# Patient Record
Sex: Male | Born: 1940 | Race: White | Hispanic: No | Marital: Married | Smoking: Former smoker
Health system: Southern US, Community
[De-identification: ages and names within clinical notes are randomized; demographics above are authoritative.]

## PROBLEM LIST (undated history)

## (undated) DIAGNOSIS — I2510 Atherosclerotic heart disease of native coronary artery without angina pectoris: Secondary | ICD-10-CM

## (undated) DIAGNOSIS — I10 Essential (primary) hypertension: Secondary | ICD-10-CM

## (undated) DIAGNOSIS — G609 Hereditary and idiopathic neuropathy, unspecified: Secondary | ICD-10-CM

## (undated) DIAGNOSIS — Z860100 Personal history of colon polyps, unspecified: Secondary | ICD-10-CM

## (undated) DIAGNOSIS — F3289 Other specified depressive episodes: Secondary | ICD-10-CM

## (undated) DIAGNOSIS — F411 Generalized anxiety disorder: Secondary | ICD-10-CM

## (undated) DIAGNOSIS — Z923 Personal history of irradiation: Secondary | ICD-10-CM

## (undated) DIAGNOSIS — D509 Iron deficiency anemia, unspecified: Secondary | ICD-10-CM

## (undated) DIAGNOSIS — R972 Elevated prostate specific antigen [PSA]: Secondary | ICD-10-CM

## (undated) DIAGNOSIS — E785 Hyperlipidemia, unspecified: Secondary | ICD-10-CM

## (undated) DIAGNOSIS — I255 Ischemic cardiomyopathy: Secondary | ICD-10-CM

## (undated) DIAGNOSIS — D696 Thrombocytopenia, unspecified: Secondary | ICD-10-CM

## (undated) DIAGNOSIS — F329 Major depressive disorder, single episode, unspecified: Secondary | ICD-10-CM

## (undated) DIAGNOSIS — Z95810 Presence of automatic (implantable) cardiac defibrillator: Secondary | ICD-10-CM

## (undated) DIAGNOSIS — E11329 Type 2 diabetes mellitus with mild nonproliferative diabetic retinopathy without macular edema: Secondary | ICD-10-CM

## (undated) DIAGNOSIS — K222 Esophageal obstruction: Secondary | ICD-10-CM

## (undated) DIAGNOSIS — N529 Male erectile dysfunction, unspecified: Secondary | ICD-10-CM

## (undated) DIAGNOSIS — C159 Malignant neoplasm of esophagus, unspecified: Secondary | ICD-10-CM

## (undated) DIAGNOSIS — I5022 Chronic systolic (congestive) heart failure: Secondary | ICD-10-CM

## (undated) HISTORY — DX: Other specified depressive episodes: F32.89

## (undated) HISTORY — DX: Hyperlipidemia, unspecified: E78.5

## (undated) HISTORY — DX: Generalized anxiety disorder: F41.1

## (undated) HISTORY — DX: Chronic systolic (congestive) heart failure: I50.22

## (undated) HISTORY — DX: Presence of automatic (implantable) cardiac defibrillator: Z95.810

## (undated) HISTORY — DX: Male erectile dysfunction, unspecified: N52.9

## (undated) HISTORY — DX: Personal history of colonic polyps: Z86.010

## (undated) HISTORY — DX: Type 2 diabetes mellitus with mild nonproliferative diabetic retinopathy without macular edema: E11.329

## (undated) HISTORY — DX: Ischemic cardiomyopathy: I25.5

## (undated) HISTORY — DX: Essential (primary) hypertension: I10

## (undated) HISTORY — DX: Major depressive disorder, single episode, unspecified: F32.9

## (undated) HISTORY — PX: ESOPHAGEAL DILATION: SHX303

## (undated) HISTORY — DX: Hereditary and idiopathic neuropathy, unspecified: G60.9

## (undated) HISTORY — DX: Atherosclerotic heart disease of native coronary artery without angina pectoris: I25.10

## (undated) HISTORY — DX: Thrombocytopenia, unspecified: D69.6

## (undated) HISTORY — DX: Iron deficiency anemia, unspecified: D50.9

## (undated) HISTORY — DX: Personal history of colon polyps, unspecified: Z86.0100

## (undated) HISTORY — PX: CORONARY ARTERY BYPASS GRAFT: SHX141

---

## 1998-02-25 ENCOUNTER — Encounter (HOSPITAL_COMMUNITY): Admission: RE | Admit: 1998-02-25 | Discharge: 1998-05-26 | Payer: Self-pay | Admitting: *Deleted

## 1998-05-28 ENCOUNTER — Encounter (HOSPITAL_COMMUNITY): Admission: RE | Admit: 1998-05-28 | Discharge: 1998-08-26 | Payer: Self-pay | Admitting: *Deleted

## 1999-01-03 ENCOUNTER — Encounter (HOSPITAL_COMMUNITY): Admission: RE | Admit: 1999-01-03 | Discharge: 1999-04-03 | Payer: Self-pay | Admitting: Cardiology

## 1999-04-03 ENCOUNTER — Encounter (HOSPITAL_COMMUNITY): Admission: RE | Admit: 1999-04-03 | Discharge: 1999-07-02 | Payer: Self-pay | Admitting: Cardiology

## 2000-12-13 ENCOUNTER — Encounter: Admission: RE | Admit: 2000-12-13 | Discharge: 2001-03-13 | Payer: Self-pay | Admitting: Endocrinology

## 2003-10-06 ENCOUNTER — Encounter: Payer: Self-pay | Admitting: Gastroenterology

## 2003-10-06 DIAGNOSIS — D126 Benign neoplasm of colon, unspecified: Secondary | ICD-10-CM | POA: Insufficient documentation

## 2003-10-16 ENCOUNTER — Encounter: Payer: Self-pay | Admitting: Gastroenterology

## 2004-10-24 ENCOUNTER — Ambulatory Visit: Payer: Self-pay | Admitting: Endocrinology

## 2004-10-31 ENCOUNTER — Ambulatory Visit: Payer: Self-pay | Admitting: Endocrinology

## 2005-01-26 ENCOUNTER — Ambulatory Visit: Payer: Self-pay | Admitting: Endocrinology

## 2005-01-30 ENCOUNTER — Ambulatory Visit: Payer: Self-pay | Admitting: Endocrinology

## 2005-03-14 ENCOUNTER — Ambulatory Visit: Payer: Self-pay | Admitting: Endocrinology

## 2005-04-14 ENCOUNTER — Ambulatory Visit: Payer: Self-pay | Admitting: Endocrinology

## 2005-06-05 ENCOUNTER — Emergency Department (HOSPITAL_COMMUNITY): Admission: EM | Admit: 2005-06-05 | Discharge: 2005-06-06 | Payer: Self-pay | Admitting: Emergency Medicine

## 2005-06-16 ENCOUNTER — Ambulatory Visit: Payer: Self-pay | Admitting: Endocrinology

## 2005-08-10 ENCOUNTER — Ambulatory Visit: Payer: Self-pay | Admitting: Endocrinology

## 2005-08-15 ENCOUNTER — Ambulatory Visit: Payer: Self-pay | Admitting: Endocrinology

## 2005-08-31 ENCOUNTER — Ambulatory Visit: Payer: Self-pay | Admitting: Cardiology

## 2005-09-13 ENCOUNTER — Ambulatory Visit: Payer: Self-pay

## 2005-09-14 ENCOUNTER — Ambulatory Visit: Payer: Self-pay | Admitting: Endocrinology

## 2005-09-26 ENCOUNTER — Ambulatory Visit: Payer: Self-pay | Admitting: Cardiology

## 2005-10-04 ENCOUNTER — Ambulatory Visit: Payer: Self-pay | Admitting: Internal Medicine

## 2005-10-24 ENCOUNTER — Ambulatory Visit: Payer: Self-pay | Admitting: Endocrinology

## 2005-11-02 ENCOUNTER — Ambulatory Visit: Payer: Self-pay | Admitting: Endocrinology

## 2005-12-04 ENCOUNTER — Ambulatory Visit: Payer: Self-pay | Admitting: Cardiology

## 2005-12-12 ENCOUNTER — Ambulatory Visit: Payer: Self-pay

## 2006-05-01 ENCOUNTER — Ambulatory Visit: Payer: Self-pay | Admitting: Endocrinology

## 2006-06-12 ENCOUNTER — Ambulatory Visit: Payer: Self-pay | Admitting: Cardiology

## 2006-06-21 ENCOUNTER — Ambulatory Visit: Payer: Self-pay

## 2006-10-29 ENCOUNTER — Ambulatory Visit: Payer: Self-pay | Admitting: Endocrinology

## 2006-10-29 LAB — CONVERTED CEMR LAB
Albumin: 4.1 g/dL (ref 3.5–5.2)
Alkaline Phosphatase: 54 units/L (ref 39–117)
BUN: 20 mg/dL (ref 6–23)
Basophils Relative: 0.3 % (ref 0.0–1.0)
CO2: 26 meq/L (ref 19–32)
Calcium: 9 mg/dL (ref 8.4–10.5)
Creatinine, Ser: 1.2 mg/dL (ref 0.4–1.5)
Creatinine,U: 127.1 mg/dL
Eosinophil percent: 2 % (ref 0.0–5.0)
GFR calc non Af Amer: 65 mL/min
Glucose, Bld: 186 mg/dL — ABNORMAL HIGH (ref 70–99)
HDL: 41.2 mg/dL (ref >39.0)
Hemoglobin: 11.3 g/dL — ABNORMAL LOW (ref 13.0–17.0)
Hgb A1c MFr Bld: 6.6 % — ABNORMAL HIGH (ref 4.6–6.0)
Ketones, ur: NEGATIVE mg/dL
Leukocytes, UA: NEGATIVE
Microalb, Ur: 0.5 mg/dL (ref 0.0–1.9)
PSA: 2.55 ng/mL (ref 0.10–4.00)
Potassium: 4.7 meq/L (ref 3.5–5.1)
RBC: 3.62 M/uL — ABNORMAL LOW (ref 4.22–5.81)
RDW: 12.4 % (ref 11.5–14.6)
Specific Gravity, Urine: 1.025 (ref 1.000–1.03)
Total Protein, Urine: NEGATIVE mg/dL
Total Protein: 6.5 g/dL (ref 6.0–8.3)
Triglyceride fasting, serum: 110 mg/dL (ref 0–149)

## 2006-11-22 ENCOUNTER — Ambulatory Visit: Payer: Self-pay | Admitting: Endocrinology

## 2006-12-06 ENCOUNTER — Ambulatory Visit: Payer: Self-pay | Admitting: Cardiology

## 2007-04-04 ENCOUNTER — Ambulatory Visit: Payer: Self-pay | Admitting: Endocrinology

## 2007-04-04 LAB — CONVERTED CEMR LAB
ALT: 16 units/L (ref 0–40)
AST: 16 units/L (ref 0–37)
Basophils Relative: 0.3 % (ref 0.0–1.0)
Bilirubin, Direct: 0.2 mg/dL (ref 0.0–0.3)
CO2: 28 meq/L (ref 19–32)
Calcium: 9 mg/dL (ref 8.4–10.5)
Chloride: 104 meq/L (ref 96–112)
Cholesterol: 134 mg/dL (ref 0–200)
Creatinine, Ser: 1 mg/dL (ref 0.4–1.5)
Eosinophils Relative: 2.3 % (ref 0.0–5.0)
Glucose, Bld: 226 mg/dL — ABNORMAL HIGH (ref 70–99)
HCT: 35.1 % — ABNORMAL LOW (ref 39.0–52.0)
Hemoglobin: 12.2 g/dL — ABNORMAL LOW (ref 13.0–17.0)
Leukocytes, UA: NEGATIVE
Lymphocytes Relative: 21.8 % (ref 12.0–46.0)
MCV: 91.2 fL (ref 78.0–100.0)
Neutrophils Relative %: 67 % (ref 43.0–77.0)
Nitrite: NEGATIVE
Platelets: 168 10*3/uL (ref 150–400)
Sodium: 136 meq/L (ref 135–145)
Specific Gravity, Urine: 1.015 (ref 1.000–1.03)
TSH: 2.72 microintl units/mL (ref 0.35–5.50)
Total Bilirubin: 0.9 mg/dL (ref 0.3–1.2)
Total CHOL/HDL Ratio: 3.6
Total Protein: 6.5 g/dL (ref 6.0–8.3)
Transferrin: 223.1 mg/dL (ref >=212.0)
Triglycerides: 142 mg/dL (ref 0–149)
Urine Glucose: 100 mg/dL — AB
WBC: 5.3 10*3/uL (ref 4.5–10.5)

## 2007-04-15 ENCOUNTER — Ambulatory Visit: Payer: Self-pay | Admitting: Endocrinology

## 2007-05-21 ENCOUNTER — Ambulatory Visit: Payer: Self-pay | Admitting: Cardiology

## 2007-05-21 LAB — CONVERTED CEMR LAB
BUN: 14 mg/dL (ref 6–23)
Basophils Relative: 0 % (ref 0.0–1.0)
CO2: 28 meq/L (ref 19–32)
Chloride: 102 meq/L (ref 96–112)
Creatinine, Ser: 1.1 mg/dL (ref 0.4–1.5)
Hemoglobin: 13.2 g/dL (ref 13.0–17.0)
Lymphocytes Relative: 18 % (ref 12.0–46.0)
Monocytes Absolute: 0.4 10*3/uL (ref 0.2–0.7)
Monocytes Relative: 4.8 % (ref 3.0–11.0)
Neutrophils Relative %: 75.1 % (ref 43.0–77.0)
Potassium: 4.6 meq/L (ref 3.5–5.1)
Prothrombin Time: 12 s (ref 10.0–14.0)
RBC: 4.2 M/uL — ABNORMAL LOW (ref 4.22–5.81)
RDW: 12.7 % (ref 11.5–14.6)

## 2007-05-23 ENCOUNTER — Inpatient Hospital Stay (HOSPITAL_BASED_OUTPATIENT_CLINIC_OR_DEPARTMENT_OTHER): Admission: RE | Admit: 2007-05-23 | Discharge: 2007-05-23 | Payer: Self-pay | Admitting: Cardiovascular Disease

## 2007-05-23 ENCOUNTER — Ambulatory Visit: Payer: Self-pay | Admitting: Cardiology

## 2007-05-23 ENCOUNTER — Ambulatory Visit: Payer: Self-pay | Admitting: Cardiovascular Disease

## 2007-05-23 ENCOUNTER — Ambulatory Visit (HOSPITAL_COMMUNITY): Admission: RE | Admit: 2007-05-23 | Discharge: 2007-05-24 | Payer: Self-pay | Admitting: Cardiovascular Disease

## 2007-05-23 HISTORY — PX: CARDIAC CATHETERIZATION: SHX172

## 2007-06-03 ENCOUNTER — Ambulatory Visit: Payer: Self-pay | Admitting: Endocrinology

## 2007-06-11 ENCOUNTER — Ambulatory Visit: Payer: Self-pay

## 2007-06-11 ENCOUNTER — Encounter: Payer: Self-pay | Admitting: Cardiology

## 2007-06-18 ENCOUNTER — Ambulatory Visit: Payer: Self-pay | Admitting: Cardiology

## 2007-06-24 ENCOUNTER — Ambulatory Visit: Payer: Self-pay | Admitting: Internal Medicine

## 2007-07-02 ENCOUNTER — Ambulatory Visit: Payer: Self-pay | Admitting: Internal Medicine

## 2007-07-02 LAB — CONVERTED CEMR LAB
BUN: 14 mg/dL (ref 6–23)
Basophils Absolute: 0 10*3/uL (ref 0.0–0.1)
Calcium: 9.2 mg/dL (ref 8.4–10.5)
Chloride: 105 meq/L (ref 96–112)
Eosinophils Absolute: 0.1 10*3/uL (ref 0.0–0.6)
Eosinophils Relative: 1.6 % (ref 0.0–5.0)
GFR calc non Af Amer: 80 mL/min
HCT: 35.1 % — ABNORMAL LOW (ref 39.0–52.0)
Lymphocytes Relative: 14.9 % (ref 12.0–46.0)
MCHC: 34.7 g/dL (ref 30.0–36.0)
MCV: 91.2 fL (ref 78.0–100.0)
Neutrophils Relative %: 76.3 % (ref 43.0–77.0)
Platelets: 162 10*3/uL (ref 150–400)
WBC: 7.3 10*3/uL (ref 4.5–10.5)
aPTT: 30.1 s (ref 26.5–36.5)

## 2007-07-05 ENCOUNTER — Ambulatory Visit: Payer: Self-pay | Admitting: Internal Medicine

## 2007-07-05 ENCOUNTER — Ambulatory Visit (HOSPITAL_COMMUNITY): Admission: RE | Admit: 2007-07-05 | Discharge: 2007-07-06 | Payer: Self-pay | Admitting: Internal Medicine

## 2007-07-05 HISTORY — PX: INSERT / REPLACE / REMOVE PACEMAKER: SUR710

## 2007-07-22 ENCOUNTER — Ambulatory Visit: Payer: Self-pay

## 2007-07-31 ENCOUNTER — Ambulatory Visit: Payer: Self-pay | Admitting: Endocrinology

## 2007-07-31 LAB — CONVERTED CEMR LAB
Basophils Relative: 0.3 % (ref 0.0–1.0)
Hgb A1c MFr Bld: 6.3 % — ABNORMAL HIGH (ref 4.6–6.0)
Iron: 54 ug/dL (ref 42–165)
Monocytes Relative: 8.4 % (ref 3.0–11.0)
Platelets: 145 10*3/uL — ABNORMAL LOW (ref 150–400)
RDW: 12.9 % (ref 11.5–14.6)
Saturation Ratios: 18.6 % — ABNORMAL LOW (ref 20.0–50.0)
Transferrin: 207.2 mg/dL — ABNORMAL LOW (ref >=212.0)

## 2007-08-05 ENCOUNTER — Encounter: Payer: Self-pay | Admitting: Endocrinology

## 2007-08-05 DIAGNOSIS — G609 Hereditary and idiopathic neuropathy, unspecified: Secondary | ICD-10-CM | POA: Insufficient documentation

## 2007-08-05 DIAGNOSIS — F329 Major depressive disorder, single episode, unspecified: Secondary | ICD-10-CM | POA: Insufficient documentation

## 2007-08-05 DIAGNOSIS — F411 Generalized anxiety disorder: Secondary | ICD-10-CM | POA: Insufficient documentation

## 2007-08-05 DIAGNOSIS — E109 Type 1 diabetes mellitus without complications: Secondary | ICD-10-CM | POA: Insufficient documentation

## 2007-08-05 DIAGNOSIS — I2510 Atherosclerotic heart disease of native coronary artery without angina pectoris: Secondary | ICD-10-CM | POA: Insufficient documentation

## 2007-08-05 DIAGNOSIS — E785 Hyperlipidemia, unspecified: Secondary | ICD-10-CM | POA: Insufficient documentation

## 2007-08-05 DIAGNOSIS — F3289 Other specified depressive episodes: Secondary | ICD-10-CM | POA: Insufficient documentation

## 2007-08-06 ENCOUNTER — Ambulatory Visit: Payer: Self-pay | Admitting: Endocrinology

## 2007-09-03 ENCOUNTER — Ambulatory Visit: Payer: Self-pay | Admitting: Cardiology

## 2007-09-09 ENCOUNTER — Ambulatory Visit: Payer: Self-pay | Admitting: Endocrinology

## 2007-09-25 ENCOUNTER — Ambulatory Visit: Payer: Self-pay | Admitting: Endocrinology

## 2007-10-16 ENCOUNTER — Telehealth (INDEPENDENT_AMBULATORY_CARE_PROVIDER_SITE_OTHER): Payer: Self-pay | Admitting: *Deleted

## 2007-10-29 ENCOUNTER — Ambulatory Visit: Payer: Self-pay | Admitting: Internal Medicine

## 2007-10-29 ENCOUNTER — Telehealth (INDEPENDENT_AMBULATORY_CARE_PROVIDER_SITE_OTHER): Payer: Self-pay | Admitting: *Deleted

## 2007-11-07 ENCOUNTER — Ambulatory Visit: Payer: Self-pay | Admitting: Endocrinology

## 2007-11-13 LAB — CONVERTED CEMR LAB
Basophils Absolute: 0 10*3/uL (ref 0.0–0.1)
Basophils Relative: 0.2 % (ref 0.0–1.0)
HCT: 33.9 % — ABNORMAL LOW (ref 39.0–52.0)
Hemoglobin: 11.4 g/dL — ABNORMAL LOW (ref 13.0–17.0)
Hgb A1c MFr Bld: 7.3 % — ABNORMAL HIGH (ref 4.6–6.0)
MCHC: 33.7 g/dL (ref 30.0–36.0)
Neutrophils Relative %: 72 % (ref 43.0–77.0)
RBC: 3.66 M/uL — ABNORMAL LOW (ref 4.22–5.81)
RDW: 12.6 % (ref 11.5–14.6)
Transferrin: 231.2 mg/dL (ref >=212.0)
WBC: 5.3 10*3/uL (ref 4.5–10.5)

## 2007-11-14 ENCOUNTER — Ambulatory Visit: Payer: Self-pay | Admitting: Endocrinology

## 2008-01-13 ENCOUNTER — Telehealth (INDEPENDENT_AMBULATORY_CARE_PROVIDER_SITE_OTHER): Payer: Self-pay | Admitting: *Deleted

## 2008-01-21 ENCOUNTER — Telehealth (INDEPENDENT_AMBULATORY_CARE_PROVIDER_SITE_OTHER): Payer: Self-pay | Admitting: *Deleted

## 2008-01-30 ENCOUNTER — Ambulatory Visit: Payer: Self-pay

## 2008-02-14 ENCOUNTER — Ambulatory Visit: Payer: Self-pay | Admitting: Endocrinology

## 2008-02-14 LAB — CONVERTED CEMR LAB: Hgb A1c MFr Bld: 7.4 % — ABNORMAL HIGH (ref 4.6–6.0)

## 2008-02-27 ENCOUNTER — Ambulatory Visit: Payer: Self-pay | Admitting: Cardiology

## 2008-02-28 ENCOUNTER — Ambulatory Visit: Payer: Self-pay | Admitting: Endocrinology

## 2008-03-09 ENCOUNTER — Ambulatory Visit: Payer: Self-pay

## 2008-04-08 ENCOUNTER — Encounter: Payer: Self-pay | Admitting: Endocrinology

## 2008-04-13 ENCOUNTER — Ambulatory Visit: Payer: Self-pay | Admitting: Internal Medicine

## 2008-04-29 ENCOUNTER — Encounter: Payer: Self-pay | Admitting: Endocrinology

## 2008-06-24 ENCOUNTER — Telehealth: Payer: Self-pay | Admitting: Endocrinology

## 2008-07-06 ENCOUNTER — Ambulatory Visit: Payer: Self-pay

## 2008-07-14 ENCOUNTER — Telehealth (INDEPENDENT_AMBULATORY_CARE_PROVIDER_SITE_OTHER): Payer: Self-pay | Admitting: *Deleted

## 2008-08-26 ENCOUNTER — Ambulatory Visit: Payer: Self-pay | Admitting: Endocrinology

## 2008-08-27 ENCOUNTER — Ambulatory Visit: Payer: Self-pay | Admitting: Cardiology

## 2008-09-01 ENCOUNTER — Ambulatory Visit: Payer: Self-pay | Admitting: Endocrinology

## 2008-10-08 ENCOUNTER — Ambulatory Visit: Payer: Self-pay

## 2008-11-30 ENCOUNTER — Ambulatory Visit: Payer: Self-pay | Admitting: Endocrinology

## 2008-12-02 LAB — CONVERTED CEMR LAB
Microalb Creat Ratio: 7.9 mg/g (ref 0.0–30.0)
Microalb, Ur: 0.8 mg/dL (ref 0.0–1.9)

## 2008-12-07 ENCOUNTER — Ambulatory Visit: Payer: Self-pay | Admitting: Endocrinology

## 2008-12-07 DIAGNOSIS — E113299 Type 2 diabetes mellitus with mild nonproliferative diabetic retinopathy without macular edema, unspecified eye: Secondary | ICD-10-CM | POA: Insufficient documentation

## 2008-12-07 DIAGNOSIS — D696 Thrombocytopenia, unspecified: Secondary | ICD-10-CM | POA: Insufficient documentation

## 2008-12-07 DIAGNOSIS — E11329 Type 2 diabetes mellitus with mild nonproliferative diabetic retinopathy without macular edema: Secondary | ICD-10-CM

## 2008-12-07 DIAGNOSIS — D509 Iron deficiency anemia, unspecified: Secondary | ICD-10-CM

## 2008-12-07 DIAGNOSIS — N529 Male erectile dysfunction, unspecified: Secondary | ICD-10-CM

## 2008-12-10 LAB — CONVERTED CEMR LAB
Basophils Absolute: 0 10*3/uL (ref 0.0–0.1)
Basophils Relative: 0.5 % (ref 0.0–3.0)
Bilirubin, Direct: 0.1 mg/dL (ref 0.0–0.3)
Calcium: 9.4 mg/dL (ref 8.4–10.5)
Creatinine, Ser: 1 mg/dL (ref 0.4–1.5)
Crystals: NEGATIVE
Folate: >20 ng/mL
GFR calc Af Amer: 96 mL/min
Iron: 69 ug/dL (ref 42–165)
Leukocytes, UA: NEGATIVE
Lymphocytes Relative: 21.7 % (ref 12.0–46.0)
MCHC: 34.9 g/dL (ref 30.0–36.0)
Mucus, UA: NEGATIVE
Neutrophils Relative %: 68.8 % (ref 43.0–77.0)
Nitrite: NEGATIVE
Platelets: 133 10*3/uL — ABNORMAL LOW (ref 150–400)
RBC / HPF: NONE SEEN
RBC: 3.77 M/uL — ABNORMAL LOW (ref 4.22–5.81)
RDW: 12.4 % (ref 11.5–14.6)
Saturation Ratios: 20.7 % (ref 20.0–50.0)
Sodium: 136 meq/L (ref 135–145)
Specific Gravity, Urine: 1.01 (ref 1.000–1.03)
TSH: 2 microintl units/mL (ref 0.35–5.50)
Total Bilirubin: 1 mg/dL (ref 0.3–1.2)
Total Protein: 6.8 g/dL (ref 6.0–8.3)
Transferrin: 237.6 mg/dL (ref >=212.0)
WBC, UA: NONE SEEN cells/hpf
pH: 5.5 (ref 5.0–8.0)

## 2009-01-05 DIAGNOSIS — I10 Essential (primary) hypertension: Secondary | ICD-10-CM | POA: Insufficient documentation

## 2009-01-07 ENCOUNTER — Ambulatory Visit: Payer: Self-pay | Admitting: Gastroenterology

## 2009-01-15 ENCOUNTER — Ambulatory Visit: Payer: Self-pay | Admitting: Gastroenterology

## 2009-01-18 ENCOUNTER — Encounter: Payer: Self-pay | Admitting: Internal Medicine

## 2009-01-18 ENCOUNTER — Ambulatory Visit: Payer: Self-pay

## 2009-04-09 ENCOUNTER — Telehealth (INDEPENDENT_AMBULATORY_CARE_PROVIDER_SITE_OTHER): Payer: Self-pay | Admitting: *Deleted

## 2009-04-12 ENCOUNTER — Ambulatory Visit: Payer: Self-pay | Admitting: Endocrinology

## 2009-04-12 LAB — CONVERTED CEMR LAB: Hgb A1c MFr Bld: 7 % — ABNORMAL HIGH (ref 4.6–6.5)

## 2009-04-13 ENCOUNTER — Ambulatory Visit: Payer: Self-pay | Admitting: Endocrinology

## 2009-04-15 ENCOUNTER — Ambulatory Visit: Payer: Self-pay | Admitting: Internal Medicine

## 2009-04-15 DIAGNOSIS — Z95810 Presence of automatic (implantable) cardiac defibrillator: Secondary | ICD-10-CM | POA: Insufficient documentation

## 2009-04-20 ENCOUNTER — Ambulatory Visit: Payer: Self-pay | Admitting: Cardiology

## 2009-04-27 ENCOUNTER — Ambulatory Visit: Payer: Self-pay | Admitting: Cardiology

## 2009-04-29 ENCOUNTER — Telehealth (INDEPENDENT_AMBULATORY_CARE_PROVIDER_SITE_OTHER): Payer: Self-pay | Admitting: *Deleted

## 2009-05-04 ENCOUNTER — Encounter: Payer: Self-pay | Admitting: Cardiology

## 2009-05-04 LAB — CONVERTED CEMR LAB
AST: 19 units/L (ref 0–37)
Albumin: 4.1 g/dL (ref 3.5–5.2)
Alkaline Phosphatase: 52 units/L (ref 39–117)
Cholesterol: 118 mg/dL (ref 0–200)
Total Protein: 6.5 g/dL (ref 6.0–8.3)
VLDL: 25.2 mg/dL (ref 0.0–40.0)

## 2009-05-11 ENCOUNTER — Emergency Department (HOSPITAL_COMMUNITY): Admission: EM | Admit: 2009-05-11 | Discharge: 2009-05-11 | Payer: Self-pay | Admitting: Emergency Medicine

## 2009-06-30 ENCOUNTER — Encounter: Payer: Self-pay | Admitting: Internal Medicine

## 2009-06-30 ENCOUNTER — Ambulatory Visit: Payer: Self-pay

## 2009-08-18 ENCOUNTER — Ambulatory Visit: Payer: Self-pay | Admitting: Endocrinology

## 2009-08-18 ENCOUNTER — Encounter (INDEPENDENT_AMBULATORY_CARE_PROVIDER_SITE_OTHER): Payer: Self-pay | Admitting: *Deleted

## 2009-08-18 LAB — CONVERTED CEMR LAB: Hgb A1c MFr Bld: 6.7 % — ABNORMAL HIGH (ref 4.6–6.5)

## 2009-08-24 ENCOUNTER — Ambulatory Visit: Payer: Self-pay | Admitting: Endocrinology

## 2009-08-26 LAB — CONVERTED CEMR LAB
Microalb Creat Ratio: 12.3 mg/g (ref 0.0–30.0)
Microalb, Ur: 0.2 mg/dL (ref 0.0–1.9)

## 2009-09-02 ENCOUNTER — Telehealth: Payer: Self-pay | Admitting: Endocrinology

## 2009-09-09 ENCOUNTER — Encounter (INDEPENDENT_AMBULATORY_CARE_PROVIDER_SITE_OTHER): Payer: Self-pay | Admitting: *Deleted

## 2009-09-09 ENCOUNTER — Telehealth: Payer: Self-pay | Admitting: Cardiology

## 2009-10-07 ENCOUNTER — Encounter: Payer: Self-pay | Admitting: Internal Medicine

## 2009-10-07 ENCOUNTER — Ambulatory Visit: Payer: Self-pay

## 2009-11-11 ENCOUNTER — Telehealth: Payer: Self-pay | Admitting: Endocrinology

## 2009-12-14 ENCOUNTER — Ambulatory Visit: Payer: Self-pay | Admitting: Endocrinology

## 2009-12-14 LAB — CONVERTED CEMR LAB
ALT: 15 units/L (ref 0–53)
AST: 19 units/L (ref 0–37)
Albumin: 4.3 g/dL (ref 3.5–5.2)
Alkaline Phosphatase: 43 units/L (ref 39–117)
Basophils Absolute: 0 10*3/uL (ref 0.0–0.1)
Basophils Relative: 0.2 % (ref 0.0–3.0)
Bilirubin Urine: NEGATIVE
Calcium: 9.1 mg/dL (ref 8.4–10.5)
Eosinophils Relative: 1.6 % (ref 0.0–5.0)
GFR calc non Af Amer: 71.13 mL/min (ref >60)
Glucose, Bld: 218 mg/dL — ABNORMAL HIGH (ref 70–99)
HCT: 36 % — ABNORMAL LOW (ref 39.0–52.0)
HDL: 47 mg/dL (ref >39.00)
Hemoglobin, Urine: NEGATIVE
Hemoglobin: 12.1 g/dL — ABNORMAL LOW (ref 13.0–17.0)
Hgb A1c MFr Bld: 7.5 % — ABNORMAL HIGH (ref 4.6–6.5)
Leukocytes, UA: NEGATIVE
Lymphocytes Relative: 16.1 % (ref 12.0–46.0)
Lymphs Abs: 0.9 10*3/uL (ref 0.7–4.0)
Monocytes Relative: 6.8 % (ref 3.0–12.0)
Neutro Abs: 3.9 10*3/uL (ref 1.4–7.7)
Nitrite: NEGATIVE
Potassium: 4.7 meq/L (ref 3.5–5.1)
RBC: 3.78 M/uL — ABNORMAL LOW (ref 4.22–5.81)
RDW: 12.2 % (ref 11.5–14.6)
Sodium: 139 meq/L (ref 135–145)
TSH: 1.93 microintl units/mL (ref 0.35–5.50)
Total CHOL/HDL Ratio: 4
WBC: 5.3 10*3/uL (ref 4.5–10.5)
pH: 6.5 (ref 5.0–8.0)

## 2009-12-16 ENCOUNTER — Emergency Department (HOSPITAL_COMMUNITY): Admission: EM | Admit: 2009-12-16 | Discharge: 2009-12-16 | Payer: Self-pay | Admitting: Emergency Medicine

## 2009-12-17 ENCOUNTER — Ambulatory Visit: Payer: Self-pay | Admitting: Cardiology

## 2009-12-17 DIAGNOSIS — I080 Rheumatic disorders of both mitral and aortic valves: Secondary | ICD-10-CM

## 2009-12-17 DIAGNOSIS — I25810 Atherosclerosis of coronary artery bypass graft(s) without angina pectoris: Secondary | ICD-10-CM

## 2009-12-17 DIAGNOSIS — I5022 Chronic systolic (congestive) heart failure: Secondary | ICD-10-CM

## 2009-12-20 ENCOUNTER — Ambulatory Visit: Payer: Self-pay | Admitting: Endocrinology

## 2010-01-10 ENCOUNTER — Ambulatory Visit: Payer: Self-pay

## 2010-01-10 ENCOUNTER — Encounter: Payer: Self-pay | Admitting: Cardiology

## 2010-01-10 ENCOUNTER — Ambulatory Visit: Payer: Self-pay | Admitting: Internal Medicine

## 2010-01-10 ENCOUNTER — Encounter: Payer: Self-pay | Admitting: Internal Medicine

## 2010-01-10 ENCOUNTER — Ambulatory Visit (HOSPITAL_COMMUNITY): Admission: RE | Admit: 2010-01-10 | Discharge: 2010-01-10 | Payer: Self-pay | Admitting: Cardiology

## 2010-01-13 ENCOUNTER — Encounter: Payer: Self-pay | Admitting: Endocrinology

## 2010-03-01 ENCOUNTER — Telehealth: Payer: Self-pay | Admitting: Endocrinology

## 2010-03-04 ENCOUNTER — Telehealth: Payer: Self-pay | Admitting: Endocrinology

## 2010-03-15 ENCOUNTER — Ambulatory Visit: Payer: Self-pay | Admitting: Endocrinology

## 2010-03-18 LAB — CONVERTED CEMR LAB: Ferritin: 56.1 ng/mL (ref 22.0–322.0)

## 2010-03-21 ENCOUNTER — Ambulatory Visit: Payer: Self-pay | Admitting: Endocrinology

## 2010-04-12 ENCOUNTER — Ambulatory Visit: Payer: Self-pay | Admitting: Internal Medicine

## 2010-06-22 ENCOUNTER — Ambulatory Visit: Payer: Self-pay | Admitting: Endocrinology

## 2010-06-27 ENCOUNTER — Ambulatory Visit: Payer: Self-pay | Admitting: Endocrinology

## 2010-07-18 ENCOUNTER — Telehealth: Payer: Self-pay | Admitting: Cardiology

## 2010-09-08 ENCOUNTER — Encounter: Payer: Self-pay | Admitting: Internal Medicine

## 2010-09-08 ENCOUNTER — Ambulatory Visit: Payer: Self-pay | Admitting: Cardiology

## 2010-09-20 ENCOUNTER — Ambulatory Visit: Payer: Self-pay | Admitting: Endocrinology

## 2010-09-21 ENCOUNTER — Telehealth: Payer: Self-pay | Admitting: Internal Medicine

## 2010-09-27 ENCOUNTER — Ambulatory Visit: Payer: Self-pay | Admitting: Endocrinology

## 2010-10-24 ENCOUNTER — Ambulatory Visit: Payer: Self-pay | Admitting: Cardiology

## 2010-10-27 LAB — CONVERTED CEMR LAB
Albumin: 4.3 g/dL (ref 3.5–5.2)
Cholesterol: 158 mg/dL (ref 0–200)
HDL: 44.4 mg/dL (ref >39.00)
Triglycerides: 130 mg/dL (ref 0.0–149.0)
VLDL: 26 mg/dL (ref 0.0–40.0)

## 2010-12-12 ENCOUNTER — Ambulatory Visit: Admission: RE | Admit: 2010-12-12 | Discharge: 2010-12-12 | Payer: Self-pay | Source: Home / Self Care

## 2010-12-12 ENCOUNTER — Encounter: Payer: Self-pay | Admitting: Internal Medicine

## 2010-12-26 ENCOUNTER — Ambulatory Visit: Admit: 2010-12-26 | Payer: Self-pay | Admitting: Endocrinology

## 2010-12-29 NOTE — Progress Notes (Signed)
Summary: dental work   Barrister's clerk Note From Other Clinic   Caller: Scientist, water quality of Call: Per [facility] pt to have dental work and needs to know what precautions if any needs to be taking. Please fax [phone] ofc [phone] Initial call taken by: Edman Circle,  July 18, 2010 11:03 AM  Follow-up for Phone Call        He will need to hold his Plavix for 3 days then start back immediately. Follow-up by: Gaylord Shih, MD, [facility],  July 18, 2010 11:39 AM     Appended Document: dental work Faxed ok to dental office. Mylo Red RN

## 2010-12-29 NOTE — Progress Notes (Signed)
Summary: Rx request  Phone Note Call from Patient   Caller: Patient Summary of Call: pt called stating that mail order co has not received Rx for insulin pen needles. I called pt back who them said that [facility] told her that their sys updated every 3-5 days. Pt's spouse also said that pt is almost completely out. I called [facility] to call in pt's Rx but was told that Rx was not at pharmacy designated to pt Rx coverage department. I called pt's spouse and was advised to print Rx for pick up so that they can mail into Mail order directly to avoid further delay.  Initial call taken by: Margaret Pyle, CMA,  March 01, 2010 11:26 AM  Follow-up for Phone Call        i signed Follow-up by: Minus Breeding MD,  March 01, 2010 11:31 AM  Additional Follow-up for Phone Call Additional follow up Details #1::        Rx mailed to pt per pt request Additional Follow-up by: Margaret Pyle, CMA,  March 01, 2010 11:57 AM    New/Updated Medications: HUMALOG KWIKPEN 100 UNIT/ML SOLN (INSULIN LISPRO (HUMAN)) tid (qac) 11-04-27 units, and pen needles three times a day, # 100 needles Prescriptions: HUMALOG KWIKPEN 100 UNIT/ML SOLN (INSULIN LISPRO (HUMAN)) tid (qac) 11-04-27 units, and pen needles three times a day, # 100 needles  #4 boxes x 3   Entered and Authorized by:   Minus Breeding MD   Signed by:   Minus Breeding MD on 03/01/2010   Method used:   Electronically to        [facility] [address] (mail-order)       [facility]       [address]       [place], [place]  [address]       Ph: [phone]       Fax: [phone]   RxID:   [id number] 1/2 " INSULIN PEN NEEDLES tid  #450 x 3   Entered by:   Margaret Pyle, CMA   Authorized by:   Minus Breeding MD   Signed by:   Margaret Pyle, CMA on 03/01/2010   Method used:   Print then Give to Patient   RxID:   [id number]

## 2010-12-29 NOTE — Assessment & Plan Note (Signed)
Summary: rov per spouse call/lg  Medications Added COREG 12.5 MG  TABS (CARVEDILOL) take 1 by mouth two times a day qd LIPITOR 40 MG TABS (ATORVASTATIN CALCIUM) Take one tablet by mouth daily.      Allergies Added: NKDA  Visit Type:  rov Primary Provider:  Romero Belling, MD  CC:  offers no cardiac complaints today.  History of Present Illness: Billy Bush returns today for further evaluation and management of his coronary disease, chronic systolic heart failure, hyperlipidemia.  He is having no orthopnea, PND, palpitations, defibrillator shocks,chest pain, angina, or change in edema.  He is on simvastatin 80 mg. His last LDL was not a goal of 89. With new guidelines, we'll switch him to 40 mg of Lipitor.  His echocardiogram in February showed EF 25% which is stable. He has mild/moderate mitral regurgitation with no evidence of increased right-sided pressures. This is felt to be relatively stable. Blood work in January of this year reviewed. Potassium normal, creatinine 1.1, hemoglobin 12.1, LFTs normal, TSH normal, cholesterol 167, HDL 47, LDL of 89, triglycerides 153, hemoglobin A1c 7.5%. Reviewed with patient.  Current Medications (verified): 1)  Lasix 80 Mg  Tabs (Furosemide) .... Take 1 By Mouth Qd 2)  Altace 5 Mg  Tabs (Ramipril) .... Take 1 By Mouth Qd 3)  Coreg 12.5 Mg  Tabs (Carvedilol) .... Take 1 By Mouth Two Times A Day Qd 4)  Spironolactone 25 Mg  Tabs (Spironolactone) .... Take 1 By Mouth Qd 5)  Plavix 75 Mg  Tabs (Clopidogrel Bisulfate) .... Take 1 By Mouth Once Daily 6)  1/2 " Insulin Pen Needles .... Tid 7)  Co Q-10 30 Mg Caps (Coenzyme Q10) .... Take 1 Tablet By Mouth Once A Day 8)  Multivitamins  Tabs (Multiple Vitamin) .... Take 1 Tablet By Mouth Once A Day 9)  Lipitor 40 Mg Tabs (Atorvastatin Calcium) .... Take One Tablet By Mouth Daily. 10)  Aspirin Ec 325 Mg Tbec (Aspirin) .... Take One Tablet By Mouth Daily 11)  Onetouch Ultra Test  Strp (Glucose Blood) ....  Three Times A Day, and Lancets 250.01 12)  Nitrostat 0.4 Mg Subl (Nitroglycerin) .Marland Kitchen.. 1 Tablet Under Tongue At Onset of Chest Pain; You May Repeat Every 5 Minutes For Up To 3 Doses. 13)  Humalog Kwikpen 100 Unit/ml Soln (Insulin Lispro (Human)) .... Tid (Qac) 10-04-23 Units, and Pen Needles Three Times A Day, # 100 Needles 14)  Glucosamine .... 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 Jude single-chamber defibrillator  07/05/2007  Doylene Canning. 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 quit 14 years ago Alcohol Use - no Illicit Drug Use - no  Risk Factors: Smoking Status: quit (01/07/2009)  Review of Systems       negative other than history of present illness  Vital Signs:  Patient profile:   70 year old male Height:      67 inches Weight:      192.8 pounds BMI:     30.31 Pulse rate:   64 / minute Pulse rhythm:   regular BP sitting:   108 / 58  (left arm) Cuff size:   large  Vitals  Entered By: Billy Bush, Billy Bush (September 08, 2010 10:02 AM)  Physical Exam  General:  Well developed, well nourished, in no acute distress. Head:  normocephalic and atraumatic Eyes:  PERRLA/EOM intact; conjunctiva and lids normal. Neck:  Neck supple, no JVD. No masses, thyromegaly or abnormal cervical nodes. Chest Wall:  no deformities or breast masses noted Lungs:  Clear bilaterally to auscultation and percussion. Heart:  PMI displaced inferior laterally, regular rate and rhythm, soft systolic murmur, no gallop, carotids equal bilaterally without bruits Msk:  Back normal, normal gait. Muscle strength and tone normal. Pulses:  reduced but present the lower extremity Extremities:  trace left pedal edema and trace right pedal edema.  trace left pedal edema.   Neurologic:  Alert and oriented x 3. Skin:  Intact without lesions or rashes. Psych:  Normal affect.    ICD Specifications Following MD:  [name], MD     Referring MD:  WALL ICD Vendor:  St Jude     ICD Model Number:  [phone]     ICD Serial Number:  [id number] ICD DOI:  07/05/2007      Lead 1:    Location: [facility]     DOI: 07/05/2007     Model #: [id number]     Serial #: [id number]     Status: active  Indications::  ICM   ICD Follow Up ICD Dependent:  No      Episodes Coumadin:  No  Brady Parameters Mode VVI     Lower Rate Limit:  40      Tachy Zones VF:  240     VT:  200     VT1:  176     Impression & Recommendations:  Problem # 1:  CAD, ARTERY BYPASS GRAFT (ICD-414.04) Assessment Unchanged  His updated medication list for this problem includes:    Altace 5 Mg Tabs (Ramipril) .Marland Kitchen... Take 1 by mouth qd    Coreg 12.5 Mg Tabs (Carvedilol) .Marland Kitchen... Take 1 by mouth two times a day qd    Plavix 75 Mg Tabs (Clopidogrel bisulfate) .Marland Kitchen... Take 1 by mouth once daily    Aspirin Ec 325 Mg Tbec (Aspirin) .Marland Kitchen... Take one tablet by mouth daily    Nitrostat 0.4 Mg Subl (Nitroglycerin) .Marland Kitchen... 1 tablet under tongue at onset of chest pain; you may repeat  every 5 minutes for up to 3 doses.  Problem # 2:  MITRAL REGURGITATION, 1-2 PLUS (ICD-396.3) Assessment: Unchanged  Problem # 3:  SYSTOLIC HEART FAILURE, CHRONIC (ICD-428.22) Assessment: Unchanged  His updated medication list for this problem includes:    Lasix 80 Mg Tabs (Furosemide) .Marland Kitchen... Take 1 by mouth qd    Altace 5 Mg Tabs (Ramipril) .Marland Kitchen... Take 1 by mouth qd    Coreg 12.5 Mg Tabs (Carvedilol) .Marland Kitchen... Take 1 by mouth two times a day qd    Spironolactone 25 Mg Tabs (Spironolactone) .Marland Kitchen... Take 1 by mouth qd    Plavix 75 Mg Tabs (Clopidogrel bisulfate) .Marland Kitchen... Take 1 by mouth once daily    Aspirin Ec 325 Mg Tbec (Aspirin) .Marland Kitchen... Take one tablet by mouth daily    Nitrostat 0.4 Mg Subl (  Nitroglycerin) .Marland Kitchen... 1 tablet under tongue at onset of chest pain; you may repeat every 5 minutes for up to 3 doses.  Problem # 4:  AUTOMATIC IMPLANTABLE CARDIAC DEFIBRILLATOR SITU (ICD-V45.02) Assessment: Unchanged  Problem # 5:  DYSLIPIDEMIA (ICD-272.4) We'll change to Lipitor 40 mg p.o. q. day. Followup blood work in 6-8 weeks. His updated medication list for this problem includes:    Lipitor 40 Mg Tabs (Atorvastatin calcium) .Marland Kitchen... Take one tablet by mouth daily.  Patient Instructions: 1)  Your physician recommends that you schedule a follow-up appointment in: 6 months with Dr. Daleen Squibb and 3 months with the device clinic. 2)  Your physician recommends that you return for a FASTING lipid profile and liver profile on October 24, 2010 3)  Your physician has recommended you make the following change in your medication: Stop Simvastatin. Start Lipitor 40 mg by mouth daily at bedtime. Prescriptions: LIPITOR 40 MG TABS (ATORVASTATIN CALCIUM) Take one tablet by mouth daily.  #90 x 3   Entered by:   Billy Bush, Billy Bush   Authorized by:   Gaylord Shih, MD, [facility]   Signed by:   Billy Bush, Billy Bush on 09/08/2010   Method used:   Faxed to ...       [facility])       [address] [address]       [place], [place]   [address]       Ph: [phone]       Fax: [phone]   RxID:   [id number] COREG 12.5 MG  TABS (CARVEDILOL) take 1 by mouth two times a day qd  #180 x 3   Entered by:   Billy Bush, Billy Bush   Authorized by:   Gaylord Shih, MD, [facility]   Signed by:   Billy Bush, Billy Bush on 09/08/2010   Method used:   Faxed to ...       [facility])       [address] [address]       [place], [place]  [address]       Ph: [phone]       Fax: [phone]   RxID:   [id number]

## 2010-12-29 NOTE — Cardiovascular Report (Signed)
Summary: Office Visit   Office Visit   Imported By: Roderic Ovens 12/16/2010 16:17:00  _____________________________________________________________________  External Attachment:    Type:   Image     Comment:   External Document

## 2010-12-29 NOTE — Progress Notes (Signed)
Summary: is this for any dental work   Phone Note From Other Clinic   Caller: [facility] [phone] Request: Talk with Nurse Summary of Call: coming off plavix is this for any dental work, pt having root canal pending.  Initial call taken by: Lorne Skeens,  July 18, 2010 3:43 PM  Follow-up for Phone Call        I spoke with Gigi Gin and patient will hold Plavix for 3 days for a root canal. Mylo Red RN

## 2010-12-29 NOTE — Procedures (Signed)
Summary: Cardiology Device Clinic    Allergies: No Known Drug Allergies   ICD Specifications Following MD:  [name], MD     Referring MD:  WALL ICD Vendor:  [employer] Jude     ICD Model Number:  [phone]     ICD Serial Number:  [id number] ICD DOI:  07/05/2007      Lead 1:    Location: RV     DOI: 07/05/2007     Model #: 4782     Serial #: [id number]     Status: active  Indications::  ICM   ICD Follow Up Battery Voltage:  3.16 V     Charge Time:  10.9 seconds     Battery Est. Longevity:  6.2 yrs Underlying rhythm:  SR ICD Dependent:  No       ICD Device Measurements Right Ventricle:  Amplitude: 11.4 mV, Impedance: 410 ohms, Threshold: 0.5 V at 0.5 msec Shock Impedance: 47 ohms   Episodes MS Episodes:  0     Coumadin:  No Shock:  0     ATP:  0     Nonsustained:  0     Atrial Therapies:  0 Ventricular Pacing:  <1%  Brady Parameters Mode VVI     Lower Rate Limit:  40      Tachy Zones VF:  240     VT:  200     VT1:  176     Next Cardiology Appt Due:  11/28/2010 Tech Comments:  NORMAL DEVICE FUNCTION.  NO EPISODES SINCE LAST CHECK.  NO CHANGES MADE. ROV IN 3 MTHS W/DEVICE CLINIC. Vella Kohler  September 08, 2010 10:48 AM

## 2010-12-29 NOTE — Assessment & Plan Note (Signed)
Summary: Z6X  Medications Added * GLUCOSAMINE daily      Allergies Added: NKDA  Visit Type:  Follow-up Primary Provider:  Romero Belling, MD  CC:  no cardaic concerns. Pt did hit left lower leg it bleed alot so he had to go to the ER . They wrapped it for him.  History of Present Illness: Billy Bush comes in today for followup and management of his coronary artery disease, chronic left ventricular systolic dysfunction with a history of systolic heart failure, hypertension, AICD, and moderate mitral regurgitation.  He's lost about 10 pounds. His function capacity is actually improved. He denies orthopnea, PND or peripheral edema. He's had no angina or ischemic symptoms. He denies any palpitations or syncope.  Current Medications (verified): 1)  Lasix 80 Mg  Tabs (Furosemide) .... Take 1 By Mouth Qd 2)  Altace 5 Mg  Tabs (Ramipril) .... Take 1 By Mouth Qd 3)  Coreg 12.5 Mg  Tabs (Carvedilol) .... Take 1 By Mouth Two Times A Day Qd 4)  Spironolactone 25 Mg  Tabs (Spironolactone) .... Take 1 By Mouth Qd 5)  Plavix 75 Mg  Tabs (Clopidogrel Bisulfate) .... Take 1 By Mouth Once Daily 6)  Humalog Pen 100 Unit/ml Soln (Insulin Lispro (Human)) .... Subcutaneously  3  Times A Day  11-04-27 Units 7)  1/2 " Insulin Pen Needles .... Tid 8)  Co Q-10 30 Mg Caps (Coenzyme Q10) .... Take 1 Tablet By Mouth Once A Day 9)  Multivitamins  Tabs (Multiple Vitamin) .... Take 1 Tablet By Mouth Once A Day 10)  Simvastatin 80 Mg Tabs (Simvastatin) .... Take One Tablet By Mouth Daily At Bedtime 11)  Aspirin Ec 325 Mg Tbec (Aspirin) .... Take One Tablet By Mouth Daily 12)  Onetouch Ultra Test  Strp (Glucose Blood) .... Three Times A Day, and Lancets 250.01 13)  Nitrostat 0.4 Mg Subl (Nitroglycerin) .Marland Kitchen.. 1 Tablet Under Tongue At Onset of Chest Pain; You May Repeat Every 5 Minutes For Up To 3 Doses. 14)  Humalog Kwikpen 100 Unit/ml Soln (Insulin Lispro (Human)) .... Tid (Qac) 11-04-27 Units 15)  Glucosamine ....  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 Jude single-chamber defibrillator  07/05/2007  Doylene Canning. 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 Head:  normocephalic and atraumatic Eyes:  PERRLA/EOM intact; conjunctiva and lids normal. Mouth:  Teeth, gums and palate normal. Oral mucosa normal. Neck:  Neck supple, no JVD. No masses, thyromegaly or abnormal cervical nodes. Chest Billy Bush:   no deformities or breast masses noted Lungs:  Clear bilaterally to auscultation and percussion. Heart:  PMI displaced, normal S1-S2, no gallop. There is no significant murmur Abdomen:  Bowel sounds positive; abdomen soft and non-tender without masses, organomegaly, or hernias noted. No hepatosplenomegaly. Msk:  Back normal, normal gait. Muscle strength and tone normal. Pulses:  diminished in lower extremities are present Extremities:  No clubbing or cyanosis. Neurologic:  Alert and oriented x 3. Skin:  Intact without lesions or rashes. Psych:  Normal affect.   Problems:  Medical Problems Added: 1)  Dx of Cad, Artery Bypass Graft  (ICD-414.04) 2)  Dx of Mitral Regurgitation, 1-2 Plus  (ICD-396.3) 3)  Dx of Systolic Heart Failure, Chronic  (ICD-428.22)   ICD Specifications Following MD:  [name], MD     ICD Vendor:  St Jude     ICD Model Number:  [phone]     ICD Serial Number:  [id number] ICD DOI:  07/05/2007      Lead 1:    Location: [facility]     DOI: 07/05/2007     Model #: 8119     Serial #: [id number]     Status: active  Indications::  ICM   ICD Follow Up ICD Dependent:  No      Episodes Coumadin:  No  Brady Parameters Mode VVI     Lower Rate Limit:  40      Tachy Zones VF:  240     VT:  200     VT1:  176     Impression & Recommendations:  Problem # 1:  CORONARY ARTERY DISEASE (ICD-414.00) Assessment Unchanged  His updated medication list for this problem includes:    Altace 5 Mg Tabs (Ramipril) .Marland Kitchen... Take 1 by mouth qd    Coreg 12.5 Mg Tabs (Carvedilol) .Marland Kitchen... Take 1 by mouth two times a day qd    Plavix 75 Mg Tabs (Clopidogrel bisulfate) .Marland Kitchen... Take 1 by mouth once daily    Aspirin Ec 325 Mg Tbec (Aspirin) .Marland Kitchen... Take one tablet by mouth daily    Nitrostat 0.4 Mg Subl (Nitroglycerin) .Marland Kitchen... 1 tablet under tongue at onset of chest pain; you may repeat every 5 minutes for up to 3 doses.  His updated medication list for this problem includes:    Altace 5 Mg Tabs (Ramipril)  .Marland Kitchen... Take 1 by mouth qd    Coreg 12.5 Mg Tabs (Carvedilol) .Marland Kitchen... Take 1 by mouth two times a day qd    Plavix 75 Mg Tabs (Clopidogrel bisulfate) .Marland Kitchen... Take 1 by mouth once daily    Aspirin Ec 325 Mg Tbec (Aspirin) .Marland Kitchen... Take one tablet by mouth daily    Nitrostat 0.4 Mg Subl (Nitroglycerin) .Marland Kitchen... 1 tablet under tongue at onset of chest pain; you may repeat every 5 minutes for up to 3 doses.  Orders: EKG w/ Interpretation (93000)  Problem # 2:  SYSTOLIC HEART FAILURE, CHRONIC (ICD-428.22) Assessment: Unchanged  His updated medication list for this problem includes:    Lasix 80 Mg Tabs (Furosemide) .Marland Kitchen... Take 1 by mouth qd    Altace 5 Mg Tabs (Ramipril) .Marland Kitchen... Take 1  by mouth qd    Coreg 12.5 Mg Tabs (Carvedilol) .Marland Kitchen... Take 1 by mouth two times a day qd    Spironolactone 25 Mg Tabs (Spironolactone) .Marland Kitchen... Take 1 by mouth qd    Plavix 75 Mg Tabs (Clopidogrel bisulfate) .Marland Kitchen... Take 1 by mouth once daily    Aspirin Ec 325 Mg Tbec (Aspirin) .Marland Kitchen... Take one tablet by mouth daily    Nitrostat 0.4 Mg Subl (Nitroglycerin) .Marland Kitchen... 1 tablet under tongue at onset of chest pain; you may repeat every 5 minutes for up to 3 doses.  His updated medication list for this problem includes:    Lasix 80 Mg Tabs (Furosemide) .Marland Kitchen... Take 1 by mouth qd    Altace 5 Mg Tabs (Ramipril) .Marland Kitchen... Take 1 by mouth qd    Coreg 12.5 Mg Tabs (Carvedilol) .Marland Kitchen... Take 1 by mouth two times a day qd    Spironolactone 25 Mg Tabs (Spironolactone) .Marland Kitchen... Take 1 by mouth qd    Plavix 75 Mg Tabs (Clopidogrel bisulfate) .Marland Kitchen... Take 1 by mouth once daily    Aspirin Ec 325 Mg Tbec (Aspirin) .Marland Kitchen... Take one tablet by mouth daily    Nitrostat 0.4 Mg Subl (Nitroglycerin) .Marland Kitchen... 1 tablet under tongue at onset of chest pain; you may repeat every 5 minutes for up to 3 doses.  Problem # 3:  CAD, ARTERY BYPASS GRAFT (ICD-414.04) Assessment: Unchanged  His updated medication list for this problem includes:    Altace 5 Mg Tabs (Ramipril) .Marland Kitchen... Take 1 by  mouth qd    Coreg 12.5 Mg Tabs (Carvedilol) .Marland Kitchen... Take 1 by mouth two times a day qd    Plavix 75 Mg Tabs (Clopidogrel bisulfate) .Marland Kitchen... Take 1 by mouth once daily    Aspirin Ec 325 Mg Tbec (Aspirin) .Marland Kitchen... Take one tablet by mouth daily    Nitrostat 0.4 Mg Subl (Nitroglycerin) .Marland Kitchen... 1 tablet under tongue at onset of chest pain; you may repeat every 5 minutes for up to 3 doses.  His updated medication list for this problem includes:    Altace 5 Mg Tabs (Ramipril) .Marland Kitchen... Take 1 by mouth qd    Coreg 12.5 Mg Tabs (Carvedilol) .Marland Kitchen... Take 1 by mouth two times a day qd    Plavix 75 Mg Tabs (Clopidogrel bisulfate) .Marland Kitchen... Take 1 by mouth once daily    Aspirin Ec 325 Mg Tbec (Aspirin) .Marland Kitchen... Take one tablet by mouth daily    Nitrostat 0.4 Mg Subl (Nitroglycerin) .Marland Kitchen... 1 tablet under tongue at onset of chest pain; you may repeat every 5 minutes for up to 3 doses.  Problem # 4:  MITRAL REGURGITATION, 1-2 PLUS (ICD-396.3) I will obtain a 2-D echocardiogram to reassess his LV function as well as the degree of mitral regurgitation. No changes made in medications today.  Other Orders: Echocardiogram (Echo)  Patient Instructions: 1)  Your physician recommends that you schedule a follow-up appointment in: 6 MONTHS WITH DR Sherleen Pangborn 2)  Your physician recommends that you continue on your current medications as directed. Please refer to the Current Medication list given to you today. 3)  Your physician has requested that you have an echocardiogram.  Echocardiography is a painless test that uses sound waves to create images of your heart. It provides your doctor with information about the size and shape of your heart and how well your heart's chambers and valves are working.  This procedure takes approximately one hour. There are no restrictions for this procedure.  Appended Document: F6M Echo and meds  reviewed. No change/stable.

## 2010-12-29 NOTE — Cardiovascular Report (Signed)
Summary: Office Visit   Office Visit   Imported By: Roderic Ovens 01/24/2010 13:12:30  _____________________________________________________________________  External Attachment:    Type:   Image     Comment:   External Document

## 2010-12-29 NOTE — Assessment & Plan Note (Signed)
Summary: 3 month follow up / FLU SHOT /-lb   Vital Signs:  Patient profile:   70 year old male Height:      67 inches (170.18 cm) Weight:      192.31 pounds (87.41 kg) BMI:     30.23 O2 Sat:      99 % on Room air Temp:     97.8 degrees F (36.56 degrees C) oral Pulse rate:   59 / minute BP sitting:   116 / 68  (left arm) Cuff size:   regular  Vitals Entered By: Brenton Grills CMA Duncan Dull) (September 27, 2010 8:07 AM)  O2 Flow:  Room air CC: 3 month F/U/pt needs 3 month rx for Insulin, Lancets, Pen Needles and Test Strips/aj Is Patient Diabetic? Yes   Primary Provider:  Romero Belling, MD  CC:  3 month F/U/pt needs 3 month rx for Insulin, Lancets, and Pen Needles and Test Strips/aj.  History of Present Illness: pt says he gets hypoglycemia 2/month, and these episodes are mild.  he atttributes this to taking more insulin than prescribed.  pt states he feels well in general.  Current Medications (verified): 1)  Lasix 80 Mg  Tabs (Furosemide) .... Take 1 By Mouth Qd 2)  Altace 5 Mg  Tabs (Ramipril) .... Take 1 By Mouth Qd 3)  Coreg 12.5 Mg  Tabs (Carvedilol) .... Take 1 By Mouth Two Times A Day Qd 4)  Spironolactone 25 Mg  Tabs (Spironolactone) .... Take 1 By Mouth Qd 5)  Plavix 75 Mg  Tabs (Clopidogrel Bisulfate) .... Take 1 By Mouth Once Daily 6)  1/2 " Insulin Pen Needles .... Tid 7)  Co Q-10 30 Mg Caps (Coenzyme Q10) .... Take 1 Tablet By Mouth Once A Day 8)  Multivitamins  Tabs (Multiple Vitamin) .... Take 1 Tablet By Mouth Once A Day 9)  Lipitor 40 Mg Tabs (Atorvastatin Calcium) .... Take One Tablet By Mouth Daily. 10)  Aspirin Ec 325 Mg Tbec (Aspirin) .... Take One Tablet By Mouth Daily 11)  Onetouch Ultra Test  Strp (Glucose Blood) .... Three Times A Day, and Lancets 250.01 12)  Nitrostat 0.4 Mg Subl (Nitroglycerin) .Marland Kitchen.. 1 Tablet Under Tongue At Onset of Chest Pain; You May Repeat Every 5 Minutes For Up To 3 Doses. 13)  Humalog Kwikpen 100 Unit/ml Soln (Insulin Lispro (Human))  .... Tid (Qac) 11-04-27 Units, and Pen Needles Three Times A Day, # 100 Needles 14)  Glucosamine .... Daily 15)  Simvastatin 80 Mg Tabs (Simvastatin) .Marland Kitchen.. 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    Physical Exam  General:  normal appearance.     Impression & Recommendations:  Problem # 1:  DIABETES MELLITUS, TYPE I (ICD-250.01) next step is to see the pattern of cbg's on a set dosage of insulin  HgbA1C: 7.4 (09/20/2010)  Medications Added to Medication List This Visit: 1)  Onetouch Ultra Test Strp (Glucose blood) .... 2 times a day, and lancets 250.01 2)  Humalog Kwikpen 100 Unit/ml Soln (Insulin lispro (human)) .... Three times a day (just before each meal) 11-04-27 units, and pen needles three times a day, # 100 needles  Other Orders: Est. Patient Level III (04540) Pneumococcal Vaccine (98119) Admin 1st Vaccine (14782) Flu Vaccine 62yrs + (95621)  Patient Instructions: 1)  check your blood sugar 3 times a day.  vary the time of day when you check, between before the 3 meals, and at bedtime.  also check if you have symptoms of your blood sugar being too high or too low.  please keep a record of the readings and  bring it to your next appointment here.  please call us sooner if you are having low blood sugar episodes.  please make sure to include testing at bedtime.   2)  take humalog to (just before each meal) 10-04-23 units.  please take only this amount.   3)  Please schedule a physical appointment in 3 months, with a1c and microalbumin, and cpx labs prior 250.01. Prescriptions: HUMALOG KWIKPEN 100 UNIT/ML SOLN (INSULIN LISPRO (HUMAN)) three times a day (just before each meal) 11-04-27 units, and pen needles three times a day, # 100 needles  #4 boxes x 3   Entered and Authorized by:   Minus Breeding MD   Signed by:   Minus Breeding MD on 09/27/2010   Method used:   Print then Give to Patient   RxID:   [id number] ONETOUCH ULTRA TEST  STRP (GLUCOSE BLOOD) 2 times a day, and lancets 250.01  #180 x 3   Entered and Authorized by:   Minus Breeding MD   Signed by:   Minus Breeding MD on 09/27/2010   Method used:   Print then Give to Patient   RxID:   [id number]    Orders Added: 1)  Est. Patient Level III [44034] 2)  Pneumococcal Vaccine [74259] 3)  Admin 1st Vaccine [90471] 4)  Flu Vaccine 23yrs + [56387]   Immunizations Administered:  Pneumonia Vaccine:    Vaccine Type: Pneumovax    Site: right deltoid    Mfr: Merck    Dose: 0.5 ml    Route: IM    Given by: Brenton Grills CMA (AAMA)    Exp. Date: 03/23/2012    Lot #: [id number]    VIS given: 11/01/09 version given September 27, 2010.   Immunizations Administered:  Pneumonia Vaccine:    Vaccine Type: Pneumovax    Site: right deltoid    Mfr: Merck    Dose: 0.5 ml    Route: IM    Given by: Brenton Grills CMA (AAMA)    Exp. Date: 03/23/2012    Lot #: [id number]    VIS given: 11/01/09 version given September 27, 2010.    Flu Vaccine Consent Questions     Do you have a history of severe allergic reactions to this vaccine? no    Any prior history of allergic reactions to egg and/or gelatin? no    Do you have a sensitivity to the  preservative Thimersol? no    Do you have a past history of Guillan-Barre Syndrome? no    Do you currently have an acute febrile illness? no    Have you ever had a severe reaction to latex? no    Vaccine information given and explained to patient? yes    Are you currently pregnant? no    Lot Number:[id number]   Exp Date:05/27/2011   Site Given  Left Deltoid  IM1

## 2010-12-29 NOTE — Progress Notes (Signed)
Summary: Gauge?  Phone Note From Pharmacy   Caller: [facility] [phone] x [phone] ref [id number] Summary of Call: [facility] called to ask MD for gauge of Insulin needles. please advise Initial call taken by: Margaret Pyle, CMA,  March 04, 2010 10:02 AM  Follow-up for Phone Call        31g please Follow-up by: Minus Breeding MD,  March 04, 2010 10:10 AM  Additional Follow-up for Phone Call Additional follow up Details #1::        pharmacist informed Additional Follow-up by: Margaret Pyle, CMA,  March 04, 2010 10:16 AM

## 2010-12-29 NOTE — Cardiovascular Report (Signed)
Summary: Office Visit   Office Visit   Imported By: Roderic Ovens 12/09/2009 16:08:36  _____________________________________________________________________  External Attachment:    Type:   Image     Comment:   External Document

## 2010-12-29 NOTE — Assessment & Plan Note (Signed)
Summary: 3 mth fu--stc   Vital Signs:  Patient profile:   70 year old male Height:      67 inches (170.18 cm) Weight:      196.13 pounds (89.15 kg) O2 Sat:      98 % on Room air Temp:     96.9 degrees F (36.06 degrees C) oral Pulse rate:   60 / minute Pulse rhythm:   regular BP sitting:   104 / 68  (left arm) Cuff size:   regular  Vitals Entered By: Brenton Grills MA (June 27, 2010 8:01 AM)  O2 Flow:  Room air CC: 3 mo F/U/aj Is Patient Diabetic? Yes   Primary Provider:  Romero Belling, MD  CC:  3 mo F/U/aj.  History of Present Illness: pt states he feels well in general.  he takes humalog qac 11-04-27 units.  he says he has been varying the insulin dosage.  last night, for example, he took only 18 units with supper.  then he found it high at hs, and took some humalog then.  no cbg record, but states cbg's are sometimes in the 60's at hs.    Current Medications (verified): 1)  Lasix 80 Mg  Tabs (Furosemide) .... Take 1 By Mouth Qd 2)  Altace 5 Mg  Tabs (Ramipril) .... Take 1 By Mouth Qd 3)  Coreg 12.5 Mg  Tabs (Carvedilol) .... Take 1 By Mouth Two Times A Day Qd 4)  Spironolactone 25 Mg  Tabs (Spironolactone) .... Take 1 By Mouth Qd 5)  Plavix 75 Mg  Tabs (Clopidogrel Bisulfate) .... Take 1 By Mouth Once Daily 6)  1/2 " Insulin Pen Needles .... Tid 7)  Co Q-10 30 Mg Caps (Coenzyme Q10) .... Take 1 Tablet By Mouth Once A Day 8)  Multivitamins  Tabs (Multiple Vitamin) .... Take 1 Tablet By Mouth Once A Day 9)  Simvastatin 80 Mg Tabs (Simvastatin) .... Take One Tablet By Mouth Daily At Bedtime 10)  Aspirin Ec 325 Mg Tbec (Aspirin) .... Take One Tablet By Mouth Daily 11)  Onetouch Ultra Test  Strp (Glucose Blood) .... Three Times A Day, and Lancets 250.01 12)  Nitrostat 0.4 Mg Subl (Nitroglycerin) .Marland Kitchen.. 1 Tablet Under Tongue At Onset of Chest Pain; You May Repeat Every 5 Minutes For Up To 3 Doses. 13)  Humalog Kwikpen 100 Unit/ml Soln (Insulin Lispro (Human)) .... Tid (Qac)  10-03-25 Units, and Pen Needles Three Times A Day, # 100 Needles 14)  Glucosamine .... 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    Physical Exam  General:  normal appearance.   Pulses:  dorsalis pedis intact bilat.  Extremities:  no deformity.  no ulcer on the feet.  feet are of normal color and temp.  no edema  Neurologic:  sensation is intact to touch on the feet   Impression & Recommendations:  Problem # 1:  DIABETES MELLITUS, TYPE I (ICD-250.01) therapy limited by noncompliance.  i'll do the best i can.  Medications Added  to Medication List This Visit: 1)  Humalog Kwikpen 100 Unit/ml Soln (Insulin lispro (human)) .... Tid (qac) 10-04-23 units, and pen needles three times a day, # 100 needles  Other Orders: Est. Patient Level III (25956)  Patient Instructions: 1)  check your blood sugar 3 times a day.  vary the time of day when you check, between before the 3 meals, and at bedtime.  also check if you have symptoms of your blood sugar being too high or too low.  please keep a record of the readings and bring it to your next appointment here.  please call us sooner if you are having low blood sugar episodes.  please make sure to  include testing at bedtime.   2)  take humalog to (just before each meal) 10-04-23 units. 3)  Please schedule a follow-up appointment in 3 months, with a1c prior 250.01.

## 2010-12-29 NOTE — Assessment & Plan Note (Signed)
Summary: [facility]saf      Allergies Added: NKDA  Visit Type:  Follow-up Primary Provider:  Romero Belling, MD   History of Present Illness: [name] comes in today for followup and management of his coronary artery disease, chronic left ventricular systolic dysfunction with a history of systolic heart failure, hypertension, AICD, and moderate mitral regurgitation.  He denies any intercurrent ICD therapies.  No c/p, sob and peripheral edema.  He has been exercising regularly.    Current Medications (verified): 1)  Lasix 80 Mg  Tabs (Furosemide) .... Take 1 By Mouth Qd 2)  Altace 5 Mg  Tabs (Ramipril) .... Take 1 By Mouth Qd 3)  Coreg 12.5 Mg  Tabs (Carvedilol) .... Take 1 By Mouth Two Times A Day Qd 4)  Spironolactone 25 Mg  Tabs (Spironolactone) .... Take 1 By Mouth Qd 5)  Plavix 75 Mg  Tabs (Clopidogrel Bisulfate) .... Take 1 By Mouth Once Daily 6)  1/2 " Insulin Pen Needles .... Tid 7)  Co Q-10 30 Mg Caps (Coenzyme Q10) .... Take 1 Tablet By Mouth Once A Day 8)  Multivitamins  Tabs (Multiple Vitamin) .... Take 1 Tablet By Mouth Once A Day 9)  Simvastatin 80 Mg Tabs (Simvastatin) .... Take One Tablet By Mouth Daily At Bedtime 10)  Aspirin Ec 325 Mg Tbec (Aspirin) .... Take One Tablet By Mouth Daily 11)  Onetouch Ultra Test  Strp (Glucose Blood) .... Three Times A Day, and Lancets 250.01 12)  Nitrostat 0.4 Mg Subl (Nitroglycerin) .Marland Kitchen.. 1 Tablet Under Tongue At Onset of Chest Pain; You May Repeat Every 5 Minutes For Up To 3 Doses. 13)  Humalog Kwikpen 100 Unit/ml Soln (Insulin Lispro (Human)) .... Tid (Qac) 10-03-25 Units, and Pen Needles Three Times A Day, # 100 Needles 14)  Glucosamine .... 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    Vital Signs:  Patient profile:   70 year old male Height:      67 inches Weight:      195 pounds BMI:     30.65 Pulse rate:   56 / minute BP sitting:   100 / 64  (left arm)  Vitals Entered By: Laurance Flatten CMA (Apr 12, 2010 8:46 AM)  Physical Exam  General:  normal appearance.  no distress  Eyes:  PERRLA/EOM intact; conjunctiva and lids normal. Mouth:  Teeth, gums and palate normal. Oral mucosa normal. Neck:  Supple without thyroid enlargement or tenderness. No cervical lymphadenopathy, neck masses or tracheal deviation.  Chest Wall:  well healed ICD incision. Lungs:  Clear bilaterally to auscultation with no wheezes, rales, or rhonchi. Heart:  Regular rate and rhythm without murmurs or gallops noted. Normal S1,S2.   Abdomen:  abdomen is soft, nontender.  no hepatosplenomegaly.   not distended.  no hernia  Msk:  muscle bulk and strength are grossly normal.  no obvious joint swelling.  gait is normal and steady  Pulses:  diminished in lower  extremities are present Extremities:  No clubbing or cyanosis. Neurologic:  cn 2-12 grossly intact.   readily moves all 4's.   sensation is intact to touch on the feet     ICD Specifications Following MD:  [name], MD     Referring MD:  WALL ICD Vendor:  [employer]     ICD Model Number:  [phone]     ICD Serial Number:  [id number] ICD DOI:  07/05/2007      Lead 1:    Location: RV      DOI: 07/05/2007     Model #: 8119     Serial #: [id number]     Status: active  Indications::  ICM   ICD Follow Up Battery Voltage:  3.16 V     Charge Time:  10.8 seconds     Battery Est. Longevity:  6.60yrs Underlying rhythm:  sr ICD Dependent:  No       ICD Device Measurements Right Ventricle:  Amplitude: 10.4 mV, Impedance: 400 ohms, Threshold: 0.75 V at 0.5 msec Shock Impedance: 43 ohms   Episodes MS Episodes:  0     Percent Mode Switch:  0     Coumadin:  No Shock:  0     ATP:  0     Nonsustained:  0     Atrial Therapies:  0 Ventricular Pacing:  <1%  Brady Parameters Mode VVI     Lower Rate Limit:  40      Tachy Zones VF:  240     VT:  200     VT1:  176     Next Cardiology Appt Due:  06/27/2010 Tech Comments:  NOIRMAL DEVICE FUNCTION.  TURNED SENSOR OFF AND SINUS REDETECTION FROM 5 INTERVALS TO 3 INTERVALS.  NEXT CHECK IN 3 MTHS. Vella Kohler  Apr 12, 2010 9:02 AM MD Comments:  Agree with above.  Impression & Recommendations:  Problem # 1:  AUTOMATIC IMPLANTABLE CARDIAC DEFIBRILLATOR SITU (ICD-V45.02) His device is working normally.  Will recheck in several months.  Problem # 2:  SYSTOLIC HEART FAILURE, CHRONIC (ICD-428.22) He remains class 1-2.  He will continue his current meds and maintain a low sodium diet. His updated medication list for this problem includes:    Lasix 80 Mg Tabs (Furosemide) .Marland Kitchen... Take 1 by mouth qd    Altace 5 Mg Tabs (Ramipril) .Marland Kitchen... Take 1 by mouth qd    Coreg 12.5 Mg Tabs (Carvedilol) .Marland Kitchen... Take 1 by mouth two times a day qd    Spironolactone 25 Mg Tabs (Spironolactone) .Marland Kitchen... Take 1 by mouth qd    Plavix 75 Mg Tabs (Clopidogrel bisulfate) .Marland Kitchen... Take 1 by mouth once daily    Aspirin Ec 325 Mg Tbec (Aspirin) .Marland Kitchen... Take one tablet by mouth daily    Nitrostat 0.4 Mg Subl (Nitroglycerin) .Marland Kitchen... 1 tablet under tongue at onset of chest pain; you may repeat every 5 minutes for up to 3 doses.  Problem # 3:  HYPERTENSION (ICD-401.9) He will continue his  current meds. His updated medication list for this problem includes:    Lasix 80 Mg Tabs (Furosemide) .Marland Kitchen... Take 1 by mouth qd    Altace 5 Mg Tabs (Ramipril) .Marland Kitchen... Take 1 by mouth qd    Coreg 12.5 Mg Tabs (Carvedilol) .Marland Kitchen... Take 1 by mouth two times a day qd    Spironolactone 25 Mg Tabs (Spironolactone) .Marland Kitchen... Take 1 by mouth qd    Aspirin Ec 325 Mg Tbec (Aspirin) .Marland Kitchen... Take one  tablet by mouth daily  Patient Instructions: 1)  Your physician recommends that you schedule a follow-up appointment in: 12 months with Dr Ladona Ridgel

## 2010-12-29 NOTE — Cardiovascular Report (Signed)
Summary: Office Visit   Office Visit   Imported By: Roderic Ovens 09/15/2010 13:38:02  _____________________________________________________________________  External Attachment:    Type:   Image     Comment:   External Document

## 2010-12-29 NOTE — Procedures (Signed)
Summary: device/saf      Allergies Added: NKDA  Current Medications (verified): 1)  Lasix 80 Mg  Tabs (Furosemide) .... Take 1 By Mouth Qd 2)  Altace 5 Mg  Tabs (Ramipril) .... Take 1 By Mouth Qd 3)  Coreg 12.5 Mg  Tabs (Carvedilol) .... Take 1 By Mouth Two Times A Day Qd 4)  Spironolactone 25 Mg  Tabs (Spironolactone) .... Take 1 By Mouth Qd 5)  Plavix 75 Mg  Tabs (Clopidogrel Bisulfate) .... Take 1 By Mouth Once Daily 6)  Co Q-10 30 Mg Caps (Coenzyme Q10) .... Take 1 Tablet By Mouth Once A Day 7)  Multivitamins  Tabs (Multiple Vitamin) .... Take 1 Tablet By Mouth Once A Day 8)  Lipitor 40 Mg Tabs (Atorvastatin Calcium) .... Take One Tablet By Mouth Daily. 9)  Aspirin Ec 325 Mg Tbec (Aspirin) .... Take One Tablet By Mouth Daily 10)  Onetouch Ultra Test  Strp (Glucose Blood) .... 2 Times A Day, and Lancets 250.01 11)  Nitrostat 0.4 Mg Subl (Nitroglycerin) .Marland Kitchen.. 1 Tablet Under Tongue At Onset of Chest Pain; You May Repeat Every 5 Minutes For Up To 3 Doses. 12)  Humalog Kwikpen 100 Unit/ml Soln (Insulin Lispro (Human)) .... Three Times A Day (Just Before Each Meal) 11-04-27 Units, and Pen Needles Three Times A Day, # 100 Needles 13)  Glucosamine .... Daily  Allergies (verified): No Known Drug Allergies   ICD Specifications Following MD:  [name], MD     Referring MD:  WALL ICD Vendor:  [employer]     ICD Model Number:  [id number]     ICD Serial Number:  [id number] ICD DOI:  07/05/2007      Lead 1:    Location: RV     DOI: 07/05/2007     Model #: 8119     Serial #: [id number]     Status: active  Indications::  ICM   ICD Follow Up Battery Voltage:  3.13 V     Charge Time:  10.9 seconds     Battery Est. Longevity:  6.0 yrs Underlying rhythm:  SR ICD Dependent:  No       ICD Device Measurements Right Ventricle:  Amplitude: 8.0 mV, Impedance: 440 ohms, Threshold: 0.5 V at 0.5 msec Shock Impedance: 45 ohms   Episodes MS Episodes:  0     Percent Mode Switch:  0     Coumadin:  No Shock:  0      ATP:  0     Nonsustained:  0     Atrial Therapies:  0 Ventricular Pacing:  <1%  Brady Parameters Mode VVI     Lower Rate Limit:  40      Tachy Zones VF:  240     VT:  200     VT1:  176     Next Cardiology Appt Due:  02/27/2011 Tech Comments:  NORMAL DEVICE FUNCTION.  NO EPISODES SINCE LAST CHECK.  NO CHANGES MADE. ROV IN 3 MTHS W/GT. Vella Kohler  December 12, 2010 9:33 AM

## 2010-12-29 NOTE — Assessment & Plan Note (Signed)
Summary: physical-#-stc   Vital Signs:  Patient profile:   70 year old male Height:      67 inches (170.18 cm) Weight:      185 pounds (84.09 kg) O2 Sat:      98 % on Room air Temp:     97.6 degrees F (36.44 degrees C) oral Pulse rate:   65 / minute BP sitting:   110 / 60  (left arm) Cuff size:   large  Vitals Entered By: Josph Macho CMA (December 20, 2009 10:03 AM)  O2 Flow:  Room air CC: Physical/ EKG was done with Dr. Daleen Squibb last week/ pt states he is taking Glucosomine but not sure how much/ CF Is Patient Diabetic? Yes   Primary Provider:  Romero Belling, MD  CC:  Physical/ EKG was done with Dr. Daleen Squibb last week/ pt states he is taking Glucosomine but not sure how much/ CF.  History of Present Illness: here for regular wellness examination.  He's feeling pretty well in general, and does not drink or smoke.   Current Medications (verified): 1)  Lasix 80 Mg  Tabs (Furosemide) .... Take 1 By Mouth Qd 2)  Altace 5 Mg  Tabs (Ramipril) .... Take 1 By Mouth Qd 3)  Coreg 12.5 Mg  Tabs (Carvedilol) .... Take 1 By Mouth Two Times A Day Qd 4)  Spironolactone 25 Mg  Tabs (Spironolactone) .... Take 1 By Mouth Qd 5)  Plavix 75 Mg  Tabs (Clopidogrel Bisulfate) .... Take 1 By Mouth Once Daily 6)  Humalog Pen 100 Unit/ml Soln (Insulin Lispro (Human)) .... Subcutaneously  3  Times A Day  11-04-27 Units 7)  1/2 " Insulin Pen Needles .... Tid 8)  Co Q-10 30 Mg Caps (Coenzyme Q10) .... Take 1 Tablet By Mouth Once A Day 9)  Multivitamins  Tabs (Multiple Vitamin) .... Take 1 Tablet By Mouth Once A Day 10)  Simvastatin 80 Mg Tabs (Simvastatin) .... Take One Tablet By Mouth Daily At Bedtime 11)  Aspirin Ec 325 Mg Tbec (Aspirin) .... Take One Tablet By Mouth Daily 12)  Onetouch Ultra Test  Strp (Glucose Blood) .... Three Times A Day, and Lancets 250.01 13)  Nitrostat 0.4 Mg Subl (Nitroglycerin) .Marland Kitchen.. 1 Tablet Under Tongue At Onset of Chest Pain; You May Repeat Every 5 Minutes For Up To 3 Doses. 14)   Humalog Kwikpen 100 Unit/ml Soln (Insulin Lispro (Human)) .... Tid (Qac) 11-04-27 Units 15)  Glucosamine .... Daily 16)  Glucosomine  Allergies (verified): No Known Drug Allergies  Family History: Reviewed history from 12/07/2008 and no changes required. no cancer  Social History: Reviewed history from 01/07/2009 and no changes required. 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 quit 14 years ago Alcohol Use - no Illicit Drug Use - no  Review of Systems  The patient denies fever, vision loss, decreased hearing, chest pain, syncope, dyspnea on exertion, prolonged cough, headaches, abdominal pain, melena, hematochezia, severe indigestion/heartburn, hematuria, and suspicious skin lesions.         he reports mild depression.  denies decreased urinary stream.  Physical Exam  General:  Well developed, well nourished, in no acute distress.  Head:  head: no deformity eyes: no periorbital swelling, no proptosis external nose and ears are normal mouth: no lesion seen Neck:  Supple without thyroid enlargement or tenderness. No cervical lymphadenopathy, neck masses or tracheal deviation.  Chest Wall:  there is a healed midline surgical scar. Heart:  Regular rate and rhythm without murmurs  or gallops noted. Normal S1,S2.   Abdomen:  abdomen is soft, nontender.  no hepatosplenomegaly.   not distended.  no hernia  Rectal:  normal external and internal exam.  heme neg  Prostate:  Normal size prostate without masses or tenderness.  Msk:  muscle bulk and strength are grossly normal.  no obvious joint swelling.  gait is normal and steady  Neurologic:  cn 2-12 grossly intact.   readily moves all 4's.   sensation is intact to touch on the feet  Skin:  there are ecchymoses on the hands. Cervical Nodes:  No significant adenopathy.  Psych:  Alert and cooperative; normal mood and affect; normal attention span and concentration.   Additional Exam:  SEPARATE EVALUATION  FOLLOWS--EACH PROBLEM HERE IS NEW, NOT RESPONDING TO TREATMENT, OR POSES SIGNIFICANT RISK TO THE PATIENT'S HEALTH: HISTORY OF THE PRESENT ILLNESS: no cbg record, but states cbg's are sometimes low after he takes humalog for a bedtime snack.  he says it is highest in am (he does not check at hs). anemia is noted on labs. PAST MEDICAL HISTORY reviewed and up to date today REVIEW OF SYSTEMS: he has lost a few lbs, due to his efforts PHYSICAL EXAMINATION: no deformity.  no ulcer on the feet.  feet are of normal color and temp.  no edema dorsalis pedis intact bilat.  no carotid bruit clear to auscultation.  no respiratory distress LAB/XRAY RESULTS: Hemoglobin           [L]  12.1 g/dL                   72.5-36.6   Hematocrit           [L]  36.0 %    Hemoglobin A1C       [H]  7.5  IMPRESSION: anemia, uncertain etiology dm, needs increased rx.  i need more cbg info in order to safely increase insulin PLAN: see instruction page    Impression & Recommendations:  Problem # 1:  ROUTINE GENERAL MEDICAL EXAM@HEALTH  CARE FACL (ICD-V70.0)  Medications Added to Medication List This Visit: 1)  Glucosomine   Other Orders: T-2 View CXR (71020TC) Est. Patient Level III (44034) Est. Patient 65& > (74259)   Patient Instructions: 1)  check your blood sugar 3 times a day.  vary the time of day when you check, between before the 3 meals, and at bedtime.  also check if you have symptoms of your blood sugar being too high or too low.  please keep a record of the readings and bring it to your next appointment here.  please call us sooner if you are having low blood sugar episodes.  please make sure to include testing at bedtime.   2)  return 3 months, with ferritin 280.9 and a1c 250.01, prior.

## 2010-12-29 NOTE — Assessment & Plan Note (Signed)
Summary: 3 MTH FU---STC   Vital Signs:  Patient profile:   70 year old male Height:      67 inches (170.18 cm) Weight:      192.25 pounds (87.39 kg) O2 Sat:      97 % on Room air Temp:     96.7 degrees F (35.94 degrees C) oral Pulse rate:   59 / minute BP sitting:   102 / 60  (left arm) Cuff size:   regular  Vitals Entered By: Josph Macho RMA (March 21, 2010 8:24 AM)  O2 Flow:  Room air CC: 3 month follow up/ CF Is Patient Diabetic? Yes   Primary Provider:  Romero Belling, MD  CC:  3 month follow up/ CF.  History of Present Illness: pt states he feels well in general.  no cbg record, but states cbg's are highet in am (high-100's).  Current Medications (verified): 1)  Lasix 80 Mg  Tabs (Furosemide) .... Take 1 By Mouth Qd 2)  Altace 5 Mg  Tabs (Ramipril) .... Take 1 By Mouth Qd 3)  Coreg 12.5 Mg  Tabs (Carvedilol) .... Take 1 By Mouth Two Times A Day Qd 4)  Spironolactone 25 Mg  Tabs (Spironolactone) .... Take 1 By Mouth Qd 5)  Plavix 75 Mg  Tabs (Clopidogrel Bisulfate) .... Take 1 By Mouth Once Daily 6)  1/2 " Insulin Pen Needles .... Tid 7)  Co Q-10 30 Mg Caps (Coenzyme Q10) .... Take 1 Tablet By Mouth Once A Day 8)  Multivitamins  Tabs (Multiple Vitamin) .... Take 1 Tablet By Mouth Once A Day 9)  Simvastatin 80 Mg Tabs (Simvastatin) .... Take One Tablet By Mouth Daily At Bedtime 10)  Aspirin Ec 325 Mg Tbec (Aspirin) .... Take One Tablet By Mouth Daily 11)  Onetouch Ultra Test  Strp (Glucose Blood) .... Three Times A Day, and Lancets 250.01 12)  Nitrostat 0.4 Mg Subl (Nitroglycerin) .Marland Kitchen.. 1 Tablet Under Tongue At Onset of Chest Pain; You May Repeat Every 5 Minutes For Up To 3 Doses. 13)  Humalog Kwikpen 100 Unit/ml Soln (Insulin Lispro (Human)) .... Tid (Qac) 11-04-27 Units, and Pen Needles Three Times A Day, # 100 Needles 14)  Glucosamine .... 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    Physical Exam  General:  normal appearance.  no distress  Skin:  insulin injection sites at anterior thighs are normal.  Additional Exam:  Hemoglobin A1C            6.5 %     Impression & Recommendations:  Problem # 1:  DIABETES MELLITUS, TYPE I (ICD-250.01) overcontrolled, considering he takes insulin  Medications Added to Medication List This Visit: 1)  Humalog Kwikpen 100 Unit/ml Soln (Insulin lispro (human)) .... Tid (qac) 10-03-25 units, and pen needles three times a day, # 100 needles  Other Orders: Est. Patient Level III (04540)  Patient Instructions: 1)  check your blood sugar 3 times a day.  vary the time of day when you check,  between before the 3 meals, and at bedtime.  also check if you have symptoms of your blood sugar being too high or too low.  please keep a record of the readings and bring it to your next appointment here.  please call us sooner if you are having low blood sugar episodes.  please make sure to include testing at bedtime.   2)  reduce humalog to (just before each meal) 10-03-25 units. 3)  Please schedule a follow-up appointment in 3 months, with a1c prior 250.01.

## 2010-12-29 NOTE — Progress Notes (Signed)
Summary: pt needs asap   Phone Note Refill Request Call back at [phone] Message from:  Patient on express script  Refills Requested: Medication #1:  LIPITOR 40 MG TABS Take one tablet by mouth daily. pt is out of samples and needs this done asap If you have any questions pls contact the spouse Glenda and pls call her let her know when it's done.  Initial call taken by: Omer Jack,  September 21, 2010 9:54 AM    Prescriptions: LIPITOR 40 MG TABS (ATORVASTATIN CALCIUM) Take one tablet by mouth daily.  #90 x 3   Entered by:   Laurance Flatten CMA   Authorized by:   Laren Boom, MD, [facility]   Signed by:   Laurance Flatten CMA on 09/26/2010   Method used:   Electronically to        [id number]* (retail)       [address]       [place], [place]  [address]       Ph: [phone] or [phone]       Fax: [phone]   RxID:   [id number]

## 2010-12-29 NOTE — Procedures (Signed)
Summary: device/saf      Allergies Added: NKDA  Current Medications (verified): 1)  Lasix 80 Mg  Tabs (Furosemide) .... Take 1 By Mouth Qd 2)  Altace 5 Mg  Tabs (Ramipril) .... Take 1 By Mouth Qd 3)  Coreg 12.5 Mg  Tabs (Carvedilol) .... Take 1 By Mouth Two Times A Day Qd 4)  Spironolactone 25 Mg  Tabs (Spironolactone) .... Take 1 By Mouth Qd 5)  Plavix 75 Mg  Tabs (Clopidogrel Bisulfate) .... Take 1 By Mouth Once Daily 6)  1/2 " Insulin Pen Needles .... Tid 7)  Co Q-10 30 Mg Caps (Coenzyme Q10) .... Take 1 Tablet By Mouth Once A Day 8)  Multivitamins  Tabs (Multiple Vitamin) .... Take 1 Tablet By Mouth Once A Day 9)  Simvastatin 80 Mg Tabs (Simvastatin) .... Take One Tablet By Mouth Daily At Bedtime 10)  Aspirin Ec 325 Mg Tbec (Aspirin) .... Take One Tablet By Mouth Daily 11)  Onetouch Ultra Test  Strp (Glucose Blood) .... Three Times A Day, and Lancets 250.01 12)  Nitrostat 0.4 Mg Subl (Nitroglycerin) .Marland Kitchen.. 1 Tablet Under Tongue At Onset of Chest Pain; You May Repeat Every 5 Minutes For Up To 3 Doses. 13)  Humalog Kwikpen 100 Unit/ml Soln (Insulin Lispro (Human)) .... Tid (Qac) 11-04-27 Units 14)  Glucosamine .... Daily  Allergies (verified): No Known Drug Allergies   ICD Specifications Following MD:  [name], MD     Referring MD:  WALL ICD Vendor:  [employer]     ICD Model Number:  [id number]     ICD Serial Number:  [id number] ICD DOI:  07/05/2007      Lead 1:    Location: RV     DOI: 07/05/2007     Model #: [id number]     Serial #: [id number]     Status: active  Indications::  ICM   ICD Follow Up Remote Check?  No Battery Voltage:  3.17 V     Charge Time:  10.9 seconds     Underlying rhythm:  SR ICD Dependent:  No       ICD Device Measurements Right Ventricle:  Amplitude: 9.0 mV, Impedance: 410 ohms, Threshold: 0.5 V at 0.5 msec Shock Impedance: 41 ohms   Episodes Coumadin:  No Shock:  0     ATP:  0     Nonsustained:  0     Ventricular Pacing:  <1%  Brady Parameters Mode VVI      Lower Rate Limit:  40      Tachy Zones VF:  240     VT:  200     VT1:  176     Next Cardiology Appt Due:  03/28/2010 Tech Comments:  Normal device function. RV output decreased to 2.5V today.  ROV 3 months GT. Gypsy Balsam RN BSN  January 10, 2010 10:07 AM

## 2010-12-29 NOTE — Letter (Signed)
Summary: [facility]  [facility]   Imported By: [name] 02/28/2010 08:23:21  _____________________________________________________________________  External Attachment:    Type:   Image     Comment:   External Document

## 2010-12-30 ENCOUNTER — Encounter (INDEPENDENT_AMBULATORY_CARE_PROVIDER_SITE_OTHER): Payer: Self-pay | Admitting: *Deleted

## 2010-12-30 ENCOUNTER — Other Ambulatory Visit: Payer: BC Managed Care – PPO

## 2010-12-30 ENCOUNTER — Other Ambulatory Visit: Payer: Self-pay | Admitting: Endocrinology

## 2010-12-30 DIAGNOSIS — E119 Type 2 diabetes mellitus without complications: Secondary | ICD-10-CM

## 2010-12-30 DIAGNOSIS — Z0389 Encounter for observation for other suspected diseases and conditions ruled out: Secondary | ICD-10-CM

## 2010-12-30 DIAGNOSIS — Z0000 Encounter for general adult medical examination without abnormal findings: Secondary | ICD-10-CM

## 2010-12-30 LAB — CBC WITH DIFFERENTIAL/PLATELET
Basophils Absolute: 0 10*3/uL (ref 0.0–0.1)
Basophils Relative: 0.4 % (ref 0.0–3.0)
Eosinophils Absolute: 0.1 10*3/uL (ref 0.0–0.7)
Eosinophils Relative: 1.6 % (ref 0.0–5.0)
HCT: 35 % — ABNORMAL LOW (ref 39.0–52.0)
Hemoglobin: 12 g/dL — ABNORMAL LOW (ref 13.0–17.0)
Lymphocytes Relative: 18.4 % (ref 12.0–46.0)
Lymphs Abs: 1.2 10*3/uL (ref 0.7–4.0)
MCHC: 34.3 g/dL (ref 30.0–36.0)
MCV: 93.8 fl (ref 78.0–100.0)
Monocytes Absolute: 0.4 10*3/uL (ref 0.1–1.0)
Monocytes Relative: 6.2 % (ref 3.0–12.0)
Neutro Abs: 4.6 10*3/uL (ref 1.4–7.7)
Neutrophils Relative %: 73.4 % (ref 43.0–77.0)
Platelets: 148 10*3/uL — ABNORMAL LOW (ref 150.0–400.0)
RBC: 3.73 Mil/uL — ABNORMAL LOW (ref 4.22–5.81)
RDW: 13.4 % (ref 11.5–14.6)
WBC: 6.3 10*3/uL (ref 4.5–10.5)

## 2010-12-30 LAB — LIPID PANEL
Cholesterol: 161 mg/dL (ref 0–200)
HDL: 42.3 mg/dL (ref >39.00)
LDL Cholesterol: 92 mg/dL (ref 0–99)
Total CHOL/HDL Ratio: 4
Triglycerides: 132 mg/dL (ref 0.0–149.0)
VLDL: 26.4 mg/dL (ref 0.0–40.0)

## 2010-12-30 LAB — URINALYSIS
Bilirubin Urine: NEGATIVE
Hgb urine dipstick: NEGATIVE
Ketones, ur: NEGATIVE
Total Protein, Urine: NEGATIVE
Urine Glucose: NEGATIVE
Urobilinogen, UA: 0.2 (ref 0.0–1.0)

## 2010-12-30 LAB — BASIC METABOLIC PANEL
Calcium: 9 mg/dL (ref 8.4–10.5)
Creatinine, Ser: 1.2 mg/dL (ref 0.4–1.5)
GFR: 66.69 mL/min (ref >60.00)

## 2010-12-30 LAB — MICROALBUMIN / CREATININE URINE RATIO
Creatinine,U: 78.2 mg/dL
Microalb Creat Ratio: 0.4 mg/g (ref 0.0–30.0)

## 2010-12-30 LAB — HEPATIC FUNCTION PANEL
ALT: 13 U/L (ref 0–53)
AST: 18 U/L (ref 0–37)
Albumin: 4.2 g/dL (ref 3.5–5.2)
Alkaline Phosphatase: 58 U/L (ref 39–117)
Bilirubin, Direct: 0.2 mg/dL (ref 0.0–0.3)
Total Protein: 6.6 g/dL (ref 6.0–8.3)

## 2011-01-03 ENCOUNTER — Encounter: Payer: Self-pay | Admitting: Endocrinology

## 2011-01-03 ENCOUNTER — Encounter (INDEPENDENT_AMBULATORY_CARE_PROVIDER_SITE_OTHER): Payer: BC Managed Care – PPO | Admitting: Endocrinology

## 2011-01-03 ENCOUNTER — Telehealth: Payer: Self-pay | Admitting: Endocrinology

## 2011-01-03 DIAGNOSIS — E109 Type 1 diabetes mellitus without complications: Secondary | ICD-10-CM

## 2011-01-03 DIAGNOSIS — Z0000 Encounter for general adult medical examination without abnormal findings: Secondary | ICD-10-CM

## 2011-01-12 NOTE — Assessment & Plan Note (Signed)
Summary: CPX BC[employer]/NWS #   Vital Signs:  Patient profile:   70 year old male Height:      67 inches (170.18 cm) Weight:      195.50 pounds (88.86 kg) BMI:     30.73 O2 Sat:      99 % on Room air Temp:     97.6 degrees F (36.44 degrees C) oral Pulse rate:   60 / minute Pulse rhythm:   regular BP sitting:   112 / 70  (left arm) Cuff size:   regular  Vitals Entered By: Brenton Grills CMA Duncan Dull) (January 03, 2011 8:57 AM)  O2 Flow:  Room air CC: CPX/aj Is Patient Diabetic? Yes   Primary Provider:  Romero Belling, MD  CC:  CPX/aj.  History of Present Illness: here for regular wellness examination.  He's feeling pretty well in general, and does not drink or smoke.   Current Medications (verified): 1)  Lasix 80 Mg  Tabs (Furosemide) .... Take 1 By Mouth Qd 2)  Altace 5 Mg  Tabs (Ramipril) .... Take 1 By Mouth Qd 3)  Coreg 12.5 Mg  Tabs (Carvedilol) .... Take 1 By Mouth Two Times A Day Qd 4)  Spironolactone 25 Mg  Tabs (Spironolactone) .... Take 1 By Mouth Qd 5)  Plavix 75 Mg  Tabs (Clopidogrel Bisulfate) .... Take 1 By Mouth Once Daily 6)  Co Q-10 30 Mg Caps (Coenzyme Q10) .... Take 1 Tablet By Mouth Once A Day 7)  Multivitamins  Tabs (Multiple Vitamin) .... Take 1 Tablet By Mouth Once A Day 8)  Lipitor 40 Mg Tabs (Atorvastatin Calcium) .... Take One Tablet By Mouth Daily. 9)  Aspirin Ec 325 Mg Tbec (Aspirin) .... Take One Tablet By Mouth Daily 10)  Onetouch Ultra Test  Strp (Glucose Blood) .... 2 Times A Day, and Lancets 250.01 11)  Nitrostat 0.4 Mg Subl (Nitroglycerin) .Marland Kitchen.. 1 Tablet Under Tongue At Onset of Chest Pain; You May Repeat Every 5 Minutes For Up To 3 Doses. 12)  Humalog Kwikpen 100 Unit/ml Soln (Insulin Lispro (Human)) .... Three Times A Day (Just Before Each Meal) 11-04-27 Units, and Pen Needles Three Times A Day, # 100 Needles 13)  Glucosamine .... Daily  Allergies (verified): No Known Drug Allergies  Family History: Reviewed history from 12/07/2008 and no  changes required. no cancer  Social History: Reviewed history from 01/07/2009 and no changes required. 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    Physical Exam  General:  normal appearance.   Head:  head: no deformity eyes: no periorbital swelling, no proptosis external nose and ears are normal mouth: no lesion seen Neck:  Supple without thyroid enlargement or tenderness. No cervical lymphadenopathy, neck masses or tracheal deviation.  Chest Wall:  there is a healed midline surgical scar. Lungs:  clear to auscultation.  no respiratory distress Heart:  Regular rate and rhythm without murmurs or gallops noted. Normal S1,S2.   Abdomen:  abdomen is soft, nontender.  no hepatosplenomegaly.   not distended.  no hernia  Prostate:  Normal size prostate without masses or tenderness.  Msk:  muscle bulk and strength are grossly normal.  no obvious joint swelling.  gait is normal and steady  Neurologic:  cn 2-12 grossly intact.   readily moves all 4's.   sensation is intact to touch on the feet  Skin:  normal texture and temp.  no rash.  not diaphoretic  Cervical Nodes:  No significant adenopathy.  Psych:  Alert and cooperative; normal mood and affect; normal attention span and concentration.   Additional Exam:  SEPARATE EVALUATION FOLLOWS--EACH PROBLEM HERE IS NEW, NOT RESPONDING TO TREATMENT, OR POSES SIGNIFICANT RISK TO THE PATIENT'S HEALTH: HISTORY OF THE PRESENT ILLNESS: the status of at least 3 ongoing medical problems is addressed today: dyslipidemia: he takes lipitor as rx'ed.  no chest pain anemia: pt says fe gave him sxs which he does not recall. dm: pt says  he has hypoglycemia sxs when he exceeds prescribed insulin dosage.  it is highest in am (higher than at hs).   PAST MEDICAL HISTORY reviewed and up to date today REVIEW OF SYSTEMS: denies weight gain and brbpr PHYSICAL EXAMINATION: no deformity.  no ulcer on the feet.  feet are of normal temp.  no edema there are bilat varicosities, and rust-color patches.  there is a vein harvest scar at the left leg.  dorsalis pedis intact bilat.  no carotid bruit normal external and internal exam.  heme neg LAB/XRAY RESULTS: LDL Cholesterol           92 mg/dL   Hemoglobin Z6X       [H]  7.6 %   Hemoglobin           [L]  12.0 g/dL                   09.6-04.5 Hematocrit           [L]  35.0 %    IMPRESSION: dm, needs increased rx.  the pattern of his cbg's indicates he needs hs insulin fe-deficiency anemia, needs increased rx dyslipidemia, needs increased rx PLAN: see instruction page   Impression & Recommendations:  Problem # 1:  ROUTINE GENERAL MEDICAL EXAM@HEALTH  CARE FACL (ICD-V70.0)  Medications Added to Medication List This Visit: 1)  Humulin N Pen 100 Unit/ml Susp (Insulin isophane human) .... 3 units at bedtime 2)  Lipitor 80 Mg Tabs (Atorvastatin calcium) .Marland Kitchen.. 1 tab at bedtime  Other Orders: Est. Patient Level IV (40981) Est. Patient 65& > (19147)   Patient Instructions: 1)  add nph insulin, 3 units at bedtime. 2)  you should take only the the prescribed insulin dosage.   3)  increase lipitor to 80 mg at bedtime 4)  take iron 1/day. 5)  Please schedule a follow-up appointment in 3 months. 6)  please consider these measures for your health:  minimize alcohol.  do not use tobacco products.  have a colonoscopy at least every 10 years from age 64.  keep firearms safely stored.  always use seat belts.  have working smoke alarms in your home.  see an eye doctor and dentist regularly.  never drive under the influence of alcohol or drugs (including prescription drugs).  those with fair skin  should take precautions against the sun. 7)  please let me know what your wishes would be, if artificial life support measures should become necessary.  it is critically important to prevent falling down (keep floor areas well-lit, dry, and free of loose objects). 8)  good diet and exercise habits significanly improve the control of your diabetes.  please let me know if you wish to be referred to a dietician.  high blood sugar is very  risky to your health.  you should see an eye doctor every year. 9)  controlling your blood pressure and cholesterol drastically reduces the damage diabetes does to your body.  this also applies to quitting smoking.  please discuss these with your doctor.  you should take an aspirin every day, unless you have been advised by a doctor not to. 10)  check your blood sugar 2 times a day.  vary the time of day when you check, between before the 3 meals, and at bedtime.  also check if you have symptoms of your blood sugar being too high or too low.  please keep a record of the readings and bring it to your next appointment here.  please call us sooner if you are having low blood sugar episodes.   11)  (update: we discussed code status.  pt requests full code, but would not want to be started or maintained on artificial life-support measures if there was not a reasonable chance of recovery) Prescriptions: LIPITOR 80 MG TABS (ATORVASTATIN CALCIUM) 1 tab at bedtime  #90 x 3   Entered by:   Margaret Pyle, CMA   Authorized by:   Minus Breeding MD   Signed by:   Margaret Pyle, CMA on 01/03/2011   Method used:   Faxed to ...       [facility] Fax) (mail-order)             ,          Ph: [phone]       Fax: [phone]   RxID:   [id number] LIPITOR 80 MG TABS (ATORVASTATIN CALCIUM) 1 tab at bedtime  #90 x 3   Entered and Authorized by:   Minus Breeding MD   Signed by:   Minus Breeding MD on 01/03/2011   Method used:   Electronically to          [facility]  [address] [id number]* (retail)       [address]       [place], [place]  [address]       Ph: [phone] or [phone]       Fax: [phone]   RxID:   [id number] HUMULIN N PEN 100 UNIT/ML SUSP (INSULIN ISOPHANE HUMAN) 3 units at bedtime  #1 box x 3   Entered and Authorized by:   Minus Breeding MD   Signed by:   Minus Breeding MD on 01/03/2011   Method used:   Electronically to        [facility]  [address] [id number]* (retail)       [address]       [place], [place]  [address]       Ph: [phone] or [phone]       Fax: [phone]   RxID:   [id number]    Orders Added: 1)  Est. Patient Level IV [94854] 2)  Est. Patient 65& > [62703]

## 2011-01-12 NOTE — Progress Notes (Signed)
Summary: ? new insulin  Phone Note Call from Patient Call back at [facility] Phone [phone]   Caller: Patient Summary of Call: Left msg on triage stating Is he suppose to take new insulin in addition to humalog or by itself. Pls advise? Initial call taken by: Orlan Leavens RMA,  January 03, 2011 2:18 PM  Follow-up for Phone Call        yes, you take humalog with meals, and the humulin nph at hs Follow-up by: Minus Breeding MD,  January 03, 2011 2:27 PM  Additional Follow-up for Phone Call Additional follow up Details #1::        called pt back spoke with wife Rivka Barbara) gave md reponse. Additional Follow-up by: Orlan Leavens RMA,  January 03, 2011 2:37 PM

## 2011-01-16 ENCOUNTER — Ambulatory Visit (INDEPENDENT_AMBULATORY_CARE_PROVIDER_SITE_OTHER): Payer: BC Managed Care – PPO

## 2011-01-16 ENCOUNTER — Inpatient Hospital Stay (INDEPENDENT_AMBULATORY_CARE_PROVIDER_SITE_OTHER)
Admission: RE | Admit: 2011-01-16 | Discharge: 2011-01-16 | Disposition: A | Discharge status: Home / Self Care | Payer: BC Managed Care – PPO | Source: Ambulatory Visit | Attending: Family Medicine | Admitting: Family Medicine

## 2011-01-16 DIAGNOSIS — R05 Cough: Secondary | ICD-10-CM

## 2011-01-19 ENCOUNTER — Encounter: Payer: Self-pay | Admitting: Endocrinology

## 2011-01-27 ENCOUNTER — Encounter (INDEPENDENT_AMBULATORY_CARE_PROVIDER_SITE_OTHER): Payer: Self-pay | Admitting: *Deleted

## 2011-02-02 NOTE — Letter (Signed)
Summary: Appointment - Reschedule  [facility], Main Office  [address]   [place], [place] [address]   Phone: [phone]  Fax: [phone]     January 27, 2011 MRN: [id number]   DEON DUER [address] [place], [place]  [address]   Dear Mr. ALOISI,   Due to a change in our office schedule, your appointment on 03-02-2011                      at  9:00 a.m.     must be changed.  It is very important that we reach you to reschedule this appointment. We look forward to participating in your health care needs. Please contact us at the number listed above at your earliest convenience to reschedule this appointment.     Sincerely,      Lorne Skeens  [facility] Scheduling Team

## 2011-02-07 NOTE — Consult Note (Signed)
Summary: [facility]   Imported By: [name] 02/01/2011 15:10:34  _____________________________________________________________________  External Attachment:    Type:   Image     Comment:   External Document

## 2011-02-26 ENCOUNTER — Encounter: Payer: Self-pay | Admitting: Cardiology

## 2011-03-02 ENCOUNTER — Ambulatory Visit: Payer: BC Managed Care – PPO | Admitting: Cardiology

## 2011-03-09 ENCOUNTER — Encounter: Payer: Self-pay | Admitting: Cardiology

## 2011-03-09 ENCOUNTER — Ambulatory Visit (INDEPENDENT_AMBULATORY_CARE_PROVIDER_SITE_OTHER): Payer: BC Managed Care – PPO | Admitting: Cardiology

## 2011-03-09 DIAGNOSIS — I5022 Chronic systolic (congestive) heart failure: Secondary | ICD-10-CM

## 2011-03-09 DIAGNOSIS — I2510 Atherosclerotic heart disease of native coronary artery without angina pectoris: Secondary | ICD-10-CM

## 2011-03-09 DIAGNOSIS — I447 Left bundle-branch block, unspecified: Secondary | ICD-10-CM

## 2011-03-09 DIAGNOSIS — Z95810 Presence of automatic (implantable) cardiac defibrillator: Secondary | ICD-10-CM

## 2011-03-09 DIAGNOSIS — I080 Rheumatic disorders of both mitral and aortic valves: Secondary | ICD-10-CM

## 2011-03-09 DIAGNOSIS — I10 Essential (primary) hypertension: Secondary | ICD-10-CM

## 2011-03-09 MED ORDER — RAMIPRIL 5 MG PO CAPS: 5.0000 mg | ORAL_CAPSULE | Freq: Every day | ORAL | Status: DC

## 2011-03-09 NOTE — Assessment & Plan Note (Signed)
Stable, no change in meds.   

## 2011-03-09 NOTE — Patient Instructions (Signed)
Your physician recommends that you schedule a follow-up appointment in: 6 MONTHS WITH DR. Daleen Squibb, WE WILL SEND A REMINDER IN THE MAIL TO YOU FOR THIS.  Your physician recommends that you continue on your current medications as directed. Please refer to the Current Medication list given to you today.

## 2011-03-09 NOTE — Progress Notes (Signed)
   Patient ID: Billy Bush, male    DOB: 11/13/1941, 70 y.o.   MRN: [id number]  HPI Billy Bush returns for E and M of his CAD, CSCHF and LBBB. He is doing well with no complaints. No change in weight, no edema, no chest pain. Exercises regularly and most compliant with meds. Labs recently obtained by Billy Bush and reviewed.    Review of Systems  Bush other systems reviewed and are negative.      Physical Exam  Constitutional: He is oriented to person, place, and time. He appears well-developed and well-nourished. No distress.  HENT:  Head: Normocephalic and atraumatic.  Eyes: EOM are normal. Pupils are equal, round, and reactive to light.  Neck: Neck supple. No JVD present. No thyromegaly present.       No carotid bruits  Cardiovascular: Normal rate, regular rhythm and normal heart sounds.   No murmur heard.      Soft systolic murmur, paradoxical S2.  Pulmonary/Chest: Effort normal and breath sounds normal.  Abdominal: Soft. Bowel sounds are normal.       NO MIDLINE BRUIT  Musculoskeletal: Normal range of motion. He exhibits no edema.  Neurological: He is alert and oriented to person, place, and time.  Skin: Skin is warm.  Psychiatric: He has a normal mood and affect.

## 2011-03-14 LAB — GLUCOSE, CAPILLARY: Glucose-Capillary: 203 mg/dL — ABNORMAL HIGH (ref 70–99)

## 2011-03-27 ENCOUNTER — Other Ambulatory Visit: Payer: BC Managed Care – PPO

## 2011-03-30 ENCOUNTER — Encounter: Payer: Self-pay | Admitting: Internal Medicine

## 2011-03-30 ENCOUNTER — Ambulatory Visit (INDEPENDENT_AMBULATORY_CARE_PROVIDER_SITE_OTHER): Payer: BC Managed Care – PPO | Admitting: Internal Medicine

## 2011-03-30 DIAGNOSIS — I428 Other cardiomyopathies: Secondary | ICD-10-CM

## 2011-03-30 DIAGNOSIS — I5022 Chronic systolic (congestive) heart failure: Secondary | ICD-10-CM

## 2011-03-30 DIAGNOSIS — I2510 Atherosclerotic heart disease of native coronary artery without angina pectoris: Secondary | ICD-10-CM

## 2011-03-30 DIAGNOSIS — Z95810 Presence of automatic (implantable) cardiac defibrillator: Secondary | ICD-10-CM

## 2011-03-30 NOTE — Patient Instructions (Signed)
Your physician recommends that you schedule a follow-up appointment in: 3 months with device clinic and 1 year with Dr Ladona Ridgel

## 2011-03-30 NOTE — Assessment & Plan Note (Signed)
His symptoms remain class I to 2. He will continue his current medications and maintain a low-sodium diet. I discussed the importance of regular daily exercise.

## 2011-03-30 NOTE — Assessment & Plan Note (Signed)
His device is working normally. Will recheck in several months. 

## 2011-03-30 NOTE — Progress Notes (Signed)
HPI [name] returns today for followup. He is a pleasant 70 year old man with an ischemic cardiomyopathy, class I congestive heart failure, status post ICD implantation. The patient has hypertension. 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 He denies any ICD shocks. He denies chest pain, shortness of breath, or syncope. No Known Allergies   Current Outpatient Prescriptions  Medication Sig Dispense Refill  . aspirin 325 MG EC tablet Take 325 mg by mouth daily.        Marland Kitchen atorvastatin (LIPITOR) 80 MG tablet Take 80 mg by mouth daily.        . carvedilol (COREG) 12.5 MG tablet Take 12.5 mg by mouth 2 (two) times daily with a meal.        . clopidogrel (PLAVIX) 75 MG tablet Take 75 mg by mouth daily.        Marland Kitchen co-enzyme Q-10 30 MG capsule Take 30 mg by mouth daily.        . furosemide (LASIX) 80 MG tablet Take 80 mg by mouth daily.        . Glucosamine 500 MG TABS Take 1 tablet by mouth daily.        Marland Kitchen glucose blood (ONE TOUCH ULTRA TEST) test strip 1 each by Other route as needed. Use as instructed       . Insulin Lispro, Human, (HUMALOG KWIKPEN [place]) Inject into the skin 3 (three) times daily before meals. 3 TIMES DAILY BEFORE EACH MEAL 11-04-27 UNITS,       . insulin NPH (HUMULIN N PEN) 100 UNIT/ML injection Inject 3 Units into the skin at bedtime.        . Multiple Vitamin (MULTIVITAMIN) tablet Take 1 tablet by mouth daily.        . nitroGLYCERIN (NITROSTAT) 0.4 MG SL tablet Place 0.4 mg under the tongue every 5 (five) minutes as needed.        . ramipril (ALTACE) 5 MG capsule Take 1 capsule (5 mg total) by mouth daily.  90 capsule  3  . spironolactone (ALDACTONE) 25 MG tablet Take 25 mg by mouth daily.           Past Medical History  Diagnosis Date  . Dyslipidemia   . Hypertension   . History of colonic polyps   . Thrombocytopenia   . Nonproliferative diabetic retinopathy NOS   . Impotence of organic  origin   . Iron deficiency anemia, unspecified   . Special screening for malignant neoplasm of prostate   . Routine general medical examination at a health care facility   . Unspecified hereditary and idiopathic peripheral neuropathy   . Hyperlipidemia   . Diabetes mellitus   . Depressive disorder, not elsewhere classified   . Coronary artery disease   . CHF (congestive heart failure)   . Anxiety state, unspecified     ROS:   All systems reviewed and negative except as noted in the HPI.   Past Surgical History  Procedure Date  . Coronary artery bypass graft     X 3  . Cardiac catheterization 05/23/07  . Insert / replace / remove pacemaker 07/05/07    St. JUDE SINGLE-CHAMBER DEFIBRILLATOR, [name], MD     No family history on file.   History   Social History  . Marital Status: Married    Spouse Name: N/A    Number of Children: 4  . Years of Education: N/A  Occupational History  . PASTOR    Social History Main Topics  . Smoking status: Former Smoker    Types: Cigarettes    Quit date: 11/27/1996  . Smokeless tobacco: Not on file  . Alcohol Use: No  . Drug Use: No  . Sexually Active:    Other Topics Concern  . Not on file   Social History Narrative   CHURCH PASTORMARRIED4 BOYSFORMER SMOKER, QUIT 14 YRSALCOHOL USE -NONO ILLICIT DRUG USEICD-St. JUDEPATIENT SIGNED A DESIGNATED PARTY RELEASE TO ALLOW HIS WIFE, GLENDA Lafoy, TO HAVE ACCESS TO HIS MEDICAL RECORDS/INFORMATION. [name], March 21, 2010 9:42 AM     BP 109/59  Pulse 60  Ht 5\' 7"  (1.702 m)  Wt 197 lb (89.359 kg)  BMI 30.85 kg/m2  Physical Exam:  Well appearing NAD HEENT: Unremarkable Neck:  No JVD, no thyromegally Lymphatics:  No adenopathy Back:  No CVA tenderness Lungs:  Clear. Well-healed ICD incision. HEART:  Regular rate rhythm, no rubs, no clicks. A soft systolic murmur is heard best at the right upper sternal border. S2 is present. Abd:  Flat, positive bowel sounds, no  organomegally, no rebound, no guarding Ext:  2 plus pulses, no edema, no cyanosis, no clubbing Skin:  No rashes no nodules Neuro:  CN II through XII intact, motor grossly intact  DEVICE  Normal device function.  See PaceArt for details.   Assess/Plan:

## 2011-03-30 NOTE — Assessment & Plan Note (Signed)
He is currently free of angina even with vigorous exertion.

## 2011-04-04 ENCOUNTER — Other Ambulatory Visit (INDEPENDENT_AMBULATORY_CARE_PROVIDER_SITE_OTHER): Payer: BC Managed Care – PPO

## 2011-04-04 ENCOUNTER — Encounter: Payer: Self-pay | Admitting: Endocrinology

## 2011-04-04 ENCOUNTER — Ambulatory Visit (INDEPENDENT_AMBULATORY_CARE_PROVIDER_SITE_OTHER): Payer: BC Managed Care – PPO | Admitting: Endocrinology

## 2011-04-04 DIAGNOSIS — E109 Type 1 diabetes mellitus without complications: Secondary | ICD-10-CM

## 2011-04-04 DIAGNOSIS — E785 Hyperlipidemia, unspecified: Secondary | ICD-10-CM

## 2011-04-04 DIAGNOSIS — D509 Iron deficiency anemia, unspecified: Secondary | ICD-10-CM

## 2011-04-04 LAB — LIPID PANEL
LDL Cholesterol: 76 mg/dL (ref 0–99)
Total CHOL/HDL Ratio: 3
VLDL: 16 mg/dL (ref 0.0–40.0)

## 2011-04-04 LAB — IBC PANEL: Saturation Ratios: 23.2 % (ref 20.0–50.0)

## 2011-04-04 LAB — CBC WITH DIFFERENTIAL/PLATELET
Basophils Relative: 0.3 % (ref 0.0–3.0)
Eosinophils Relative: 1.7 % (ref 0.0–5.0)
HCT: 34.5 % — ABNORMAL LOW (ref 39.0–52.0)
Hemoglobin: 11.9 g/dL — ABNORMAL LOW (ref 13.0–17.0)
Lymphs Abs: 1.3 10*3/uL (ref 0.7–4.0)
MCV: 94.1 fl (ref 78.0–100.0)
Monocytes Absolute: 0.5 10*3/uL (ref 0.1–1.0)
Monocytes Relative: 7.3 % (ref 3.0–12.0)
Neutro Abs: 4.7 10*3/uL (ref 1.4–7.7)
Platelets: 155 10*3/uL (ref 150.0–400.0)
RBC: 3.67 Mil/uL — ABNORMAL LOW (ref 4.22–5.81)
WBC: 6.6 10*3/uL (ref 4.5–10.5)

## 2011-04-04 LAB — HEMOGLOBIN A1C: Hgb A1c MFr Bld: 6.9 % — ABNORMAL HIGH (ref 4.6–6.5)

## 2011-04-04 MED ORDER — INSULIN NPH (HUMAN) (ISOPHANE) 100 UNIT/ML ~~LOC~~ SUSP: 5.0000 [IU] | Freq: Every day | SUBCUTANEOUS | Status: DC

## 2011-04-04 NOTE — Progress Notes (Signed)
Subjective:    Patient ID: Billy Bush, male    DOB: 1940/12/20, 70 y.o.   MRN: [id number]  HPI The state of at least three ongoing medical problems is addressed today: Dm: no cbg record, but states cbg's are well-controlled.  He stopped the hs insulin, because he says it did not help.  Denies hypoglycemia.  He takes extra insulin in am also.   Anemia: Denies brbpr. Dyslipidemia: He says his diet is good.  He takes the increased dosage of lipitor.   Past Medical History  Diagnosis Date  . Dyslipidemia   . Hypertension   . History of colonic polyps   . Thrombocytopenia   . Nonproliferative diabetic retinopathy NOS   . Impotence of organic origin   . Iron deficiency anemia, unspecified   . Special screening for malignant neoplasm of prostate   . Routine general medical examination at a health care facility   . Unspecified hereditary and idiopathic peripheral neuropathy   . Hyperlipidemia   . Diabetes mellitus   . Depressive disorder, not elsewhere classified   . Coronary artery disease   . CHF (congestive heart failure)   . Anxiety state, unspecified     Past Surgical History  Procedure Date  . Coronary artery bypass graft     X 3  . Cardiac catheterization 05/23/07  . Insert / replace / remove pacemaker 07/05/07    St. JUDE SINGLE-CHAMBER DEFIBRILLATOR, [name], MD    History   Social History  . Marital Status: Married    Spouse Name: N/A    Number of Children: 4  . Years of Education: N/A   Occupational History  . PASTOR    Social History Main Topics  . Smoking status: Former Smoker    Types: Cigarettes    Quit date: 11/27/1996  . Smokeless tobacco: Not on file  . Alcohol Use: No  . Drug Use: No  . Sexually Active:    Other Topics Concern  . Not on file   Social History Narrative   CHURCH PASTORMARRIED4 BOYSFORMER SMOKER, QUIT 14 YRSALCOHOL USE -NONO ILLICIT DRUG USEICD-St. JUDEPATIENT SIGNED A DESIGNATED PARTY RELEASE TO ALLOW HIS WIFE, GLENDA  Candelas, TO HAVE ACCESS TO HIS MEDICAL RECORDS/INFORMATION. [name], March 21, 2010 9:42 AM    Current Outpatient Prescriptions on File Prior to Visit  Medication Sig Dispense Refill  . aspirin 325 MG EC tablet Take 325 mg by mouth daily.        Marland Kitchen atorvastatin (LIPITOR) 80 MG tablet Take 80 mg by mouth daily.        . carvedilol (COREG) 12.5 MG tablet Take 12.5 mg by mouth 2 (two) times daily with a meal.        . clopidogrel (PLAVIX) 75 MG tablet Take 75 mg by mouth daily.        Marland Kitchen co-enzyme Q-10 30 MG capsule Take 30 mg by mouth daily.        . furosemide (LASIX) 80 MG tablet Take 80 mg by mouth daily.        . Glucosamine 500 MG TABS Take 1 tablet by mouth daily.        Marland Kitchen glucose blood (ONE TOUCH ULTRA TEST) test strip 1 each by Other route as needed. Use as instructed       . Insulin Lispro, Human, (HUMALOG KWIKPEN [place]) 3 TIMES DAILY BEFORE EACH MEAL 11-04-27 UNITS,      . Multiple Vitamin (MULTIVITAMIN) tablet Take 1 tablet by mouth daily.        Marland Kitchen  nitroGLYCERIN (NITROSTAT) 0.4 MG SL tablet Place 0.4 mg under the tongue every 5 (five) minutes as needed.        . ramipril (ALTACE) 5 MG capsule Take 1 capsule (5 mg total) by mouth daily.  90 capsule  3  . spironolactone (ALDACTONE) 25 MG tablet Take 25 mg by mouth daily.          No Known Allergies  No family history on file.  BP 122/78  Pulse 59  Temp(Src) 97.8 F (36.6 C) (Oral)  Ht 5\' 7"  (1.702 m)  Wt 202 lb 3.2 oz (91.717 kg)  BMI 31.67 kg/m2  SpO2 98%    Review of Systems denies hypoglycemia and weight change.      Objective:   Physical Exam GENERAL: no distress SKIN: Insulin injection sites at the anterior abdomen are normal, except for 1 ecchymosis.      Lab Results  Component Value Date   HGBA1C 6.9* 04/04/2011   Lab Results  Component Value Date   CHOL 133 04/04/2011   CHOL 161 12/30/2010   CHOL 158 10/24/2010   Lab Results  Component Value Date   HDL 41.20 04/04/2011   HDL 42.30 12/30/2010   HDL 44.40  10/24/2010   Lab Results  Component Value Date   LDLCALC 76 04/04/2011   LDLCALC 92 12/30/2010   LDLCALC 88 10/24/2010   Lab Results  Component Value Date   TRIG 80.0 04/04/2011   TRIG 132.0 12/30/2010   TRIG 130.0 10/24/2010   Lab Results  Component Value Date   CHOLHDL 3 04/04/2011   CHOLHDL 4 12/30/2010   CHOLHDL 4 10/24/2010   No results found for this basename: LDLDIRECT   Lab Results  Component Value Date   WBC 6.6 04/04/2011   HGB 11.9* 04/04/2011   HCT 34.5* 04/04/2011   MCV 94.1 04/04/2011   PLT 155.0 04/04/2011       Assessment & Plan:  Dm, well-controlled dyslipidemia, well-controlled Anemia, improved.

## 2011-04-04 NOTE — Patient Instructions (Addendum)
blood tests are being ordered for you today.  please call [phone] to hear your test results.  You will be prompted to enter the 9-digit "MRN" number that appears at the top left of this page, followed by #.  Then you will hear the message. pending the test results, please continue the same humalog. Take nph insulin, 5 units at bedtime. Please make a follow-up appointment in 3 months, with blood tests about 1 week prior. (update: i left message on phone-tree:  rx as we discussed)

## 2011-04-11 NOTE — Cardiovascular Report (Signed)
NAME:  Billy Bush, Billy Bush NO.:  [id number]   MEDICAL RECORD NO.:  [id number]          PATIENT TYPE:  OIB   LOCATION:  1963                         FACILITY:  [facility]   PHYSICIAN:  Rollene Rotunda, MD, FACCDATE OF BIRTH:  01-Apr-1941   DATE OF PROCEDURE:  05/22/2007  DATE OF DISCHARGE:                            CARDIAC CATHETERIZATION   PRIMARY CARE PHYSICIAN:  Sean A. Everardo All, MD.   CARDIOLOGIST:  Dr. Juanito Doom.   PROCEDURE:  Left heart catheterization/coronary arteriography with  injection of arterial and venous bypass grafts.   INDICATIONS:  Evaluate patient with unstable angina and previous bypass  grafting.   PROCEDURE NOTE:  Left heart catheterization was performed via right  femoral artery.  The artery was cannulated using the anterior wall  puncture.  A #4-French arterial sheath was inserted via the modified  Seldinger technique.  Preformed Judkins and a pigtail catheter utilized.  The patient at procedure well and left he lab in stable condition.   RESULTS:  Hemodynamics:  LV 152/19, AO 143/95.   Coronaries:  The left main had distal 80% stenosis before a large ramus  intermediate which was not bypassed.  The LAD was occluded at the  ostium.  The vessel was seen to fill via the LIMA graft.  There were no  high-grade lesions.  There was a 40% mid stenosis and diffuse luminal  irregularities.  The vessel wrapped the apex.  There was a small first  diagonal which was normal.  The circumflex in the AV groove had luminal  irregularities.  There was a very large ramus intermediate which was  free of high-grade disease but compromised by the left main stenosis.  The ramus had a long 25% stenosis.  There was a obtuse marginal slash  posterolateral which was occluded at the ostium and seen to fill via a  vein graft.  It was free of high-grade disease.  The right coronary  artery is occluded at the ostium.  There was severe proximal mid  stenosis.  The PDA was seen  to fill via vein graft.  It had diffuse  luminal irregularities.   Grafts:  The LIMA to the LAD was widely patent.  Saphenous vein graft to  the obtuse marginal had luminal irregularities.  Saphenous vein graft to  the PDA had diffuse luminal irregularities.  There was mid 25-30%  stenosis followed by mid 40% stenosis.   Left ventriculogram:  A left ventriculogram was obtained in the RAO  projection.  The EF was 30% with inferior akinesis.  There was global  hypokinesis.   CONCLUSION:  Severe native three-vessel coronary artery disease.  A  large ramus intermediate is compromised by a high-grade left main  lesion.  The ramus intermediate is not bypassed.  The bypass grafts are  patent and free of high-grade disease.  There is moderately severe  ischemic cardiomyopathy.   PLAN:  The patient will have percutaneous revascularization of the left  main by Dr. Excell Seltzer.      Rollene Rotunda, MD, [facility]  Electronically Signed     JH/MEDQ  D:  05/23/2007  T:  05/23/2007  Job:  [id number]   cc:   Gregary Signs A. Everardo All, MD  Jesse Sans Wall, MD, [facility]

## 2011-04-11 NOTE — Assessment & Plan Note (Signed)
[facility]                            CARDIOLOGY OFFICE NOTE   Billy Bush, Billy Bush                        MRN:          [id number]  DATE:06/18/2007                            DOB:          1941-05-01    REASON FOR VISIT:  The patient returns today after being discharged from  the hospital.   HISTORY:  Please see my note from May 21, 2007.   The patient underwent cardiac catheterization per my recommendation.  He  had a 95% left main stem lesion that supplied a large intermediate  branch of the left circumflex.  His bypass grafts were patent otherwise.  Dr. Excell Seltzer placed a Cypher drug-eluting stent to the left main.  He  recommended aspirin and Plavix for a year.  A follow up 2-D  echocardiogram was scheduled to see if there was any improvement in left  ventricular function; this was on June 11, 2007, not quite four weeks  out from his procedure.  He has known ejection fraction of around 30%  for several years.  The left ventricle was moderately dilated at 63 mm.  His EF was 20-25%.  He has akinesia of the periapical and mid-to-distal  anteroseptal wall and paradoxical motion of the intraventricular septum.  He does have an intraventricular conduction delay, which is mild.  He  has had previous surgery obviously.   The patient is really class 2 New York Heart Association congestive  failure.  I have asked him again to strongly consider a defibrillator  plus or minus a pacer with Dr. Ladona Ridgel.  He has agreed to see him again.   MEDICATIONS:  The patient's meds are unchanged since his last visit,  except that he is now on Plavix 75 mg a day.   PHYSICAL EXAMINATION:  VITAL SIGNS:  The patient's blood pressure is  124/66, pulse 63 and regular, and weigh is 217 pounds.  HEENT:  The head, eyes, ears, nose and throat are unchanged.  NECK:  The carotids are full.  There are no bruits.  Thyroid is not  enlarged.  Trachea is midline.  LUNGS:  The lungs are  clear.  HEART:  The heart reveals a displaced PMI.  He has a dyskinetic apex.  Normal S1 and S2.  ABDOMEN:  The abdominal exam reveals it to be soft with good bowel  sounds.  No midline bruit.  EXTREMITIES:  The extremities are without edema.  Pulses are intact.  NEUROLOGIC:  The neuro exam is intact.   DISCUSSION:  I had a nice talk with the patient.  I recommended to him  to see Dr. Ladona Ridgel and he finally agreed to that.  I will see him back  again in three months.     Thomas C. Daleen Squibb, MD, [facility]  Electronically Signed    TCW/MedQ  DD: 06/18/2007  DT: 06/19/2007  Job #: [id number]   cc:   Gregary Signs A. Everardo All, MD

## 2011-04-11 NOTE — Cardiovascular Report (Signed)
NAME:  Billy Bush, Billy Bush NO.:  [id number]   MEDICAL RECORD NO.:  [id number]          PATIENT TYPE:  OIB   LOCATION:  1963                         FACILITY:  [facility]   PHYSICIAN:  Veverly Fells. Excell Seltzer, MD  DATE OF BIRTH:  1941-01-15   DATE OF PROCEDURE:  05/23/2007  DATE OF DISCHARGE:  05/23/2007                            CARDIAC CATHETERIZATION   PROCEDURE:  PTCA and drug-eluting stent placement in a protected left  main stem.   INDICATIONS:  [name] is a 70 year old gentleman who has had  crescendo angina.  He is status post bypass surgery.  With his new  symptoms, he was referred for a diagnostic catheterization by Dr. Daleen Squibb.  Dr. Antoine Poche did his catheterization which demonstrated patent bypass  grafts with a notable finding of a 95% left main stem lesion that  supplies a large intermediate branch as well as the left circumflex.  The intermediate is un-grafted.  We elected to proceed with PCI of this  lesion, as it supplies a moderate-to-large amount of myocardium that is  unprotected.   Risks and indications of the procedure were explained to the patient.  Informed consent was obtained.  The right groin had been accessed and a  4-French sheath was in place.  Lidocaine was used to anesthetize the  area.  Using normal technique, the 4-French sheath was upsized to a 6-  [place] sheath.  Angiomax was used for anticoagulation.  The patient been  preloaded on aspirin and clopidogrel.  He received 600 mg of clopidogrel  prior to the procedure.  Once a therapeutic ACT was achieved, an XB-3/5  guide catheter was inserted and a cougar guidewire was passed into the  intermediate branch.  A 3-0 x 9-mm Maverick balloon was inflated to 10  atmospheres over the lesion and the lesion expanded well.  Following pre-dilatation, I elected to stent this with a 03/05 x 13-mm  CYPHER stent which was deployed 16 atmospheres.  Following stenting, the stent was fairly well expanded but  there was a  waste in the area of the lesion.  The vessel was post dilated with a 4-0  x 10-mm dura Star balloon on 2 inflations up to 18 atmospheres on both.  At the conclusion of the procedure, there was a widely expanded stent  and TIMI III flow in all vessels.  There was no compromise of the  circumflex branch.   CONCLUSION:  Successful percutaneous coronary intervention with a drug-  eluting stent placed in the left mainstem.   RECOMMENDATIONS:  The patient should be on aspirin and clopidogrel for  minimum of 12 months.  If he is tolerating the combination I would  recommend 2 years of dual antiplatelet therapy.      Veverly Fells. Excell Seltzer, MD  Electronically Signed     MDC/MEDQ  D:  05/23/2007  T:  05/23/2007  Job:  [id number]   cc:   Jesse Sans. Wall, MD, [facility]

## 2011-04-11 NOTE — Assessment & Plan Note (Signed)
[facility]                            CARDIOLOGY OFFICE NOTE   KINGJAMES, COURY                        MRN:          [id number]  DATE:02/27/2008                            DOB:          1941/10/30    Billy Bush comes in today for followup.   PROBLEM LIST:  1. Coronary artery disease.  He is currently having no angina or      ischemic symptoms.  He had an ischemic cardiomyopathy and is status      post left main stenting, as well as a large intermediate branch and      a left circumflex May 23, 2007.  This drug-eluting stent.  Remains      on Plavix and aspirin.  2. Left ventricular dysfunction, EF of 20% to 25%.  3. Status post AICD by Dr. Ladona Ridgel, with followup recently by Dr.      Ladona Ridgel.  He has had no firings.  4. Class II heart failure.  He has lost a bunch of weight, actually      about 15 to 20 pounds and looks the best and is actually in the      best functional capacity he has had.  5. Hypertension.  6. Type 2 diabetes seven.  7. Peripheral neuropathy.  8. History of coronary bypass grafting in June 14, 1996.  9. History of hyperlipidemia.  He saw Dr. Everardo All concerning his      diabetes and hyperlipidemia tomorrow.   He denies any orthopnea, PND or peripheral edema.  He had no bleeding  problems.  His blood pressure is under good control.  His sugar is  strictly watched.  As I said, he has lost about 15 to 20 pounds.   MEDICATIONS:  1. Lasix 80 mg a day.  2. Altace takes 5 mg a day.  3. Vytorin 10/80 q.h.s.  4. Coreg 12.5 b.i.d.  5. Spironolactone 25 mg a day.  6. Humalog as directed.  7. Coenzyme Q 10.  8. Enteric-coated aspirin 325 mg a day.  9. Multivitamin daily.  10.Glucosamine chondroitin.  11.Plavix 75 mg a day.  12.Nu-Iron.  13.Carry sublingual nitroglycerin.   EXAMINATION:  VITAL SIGNS:  His blood pressure are really good today at  100/58.  His pulse is 60 and regular.  His EKG shows sinus rhythm with  an  incomplete left bundle which is stable.  HEENT:  Normocephalic, atraumatic.  His face looks much thinner.  Facial  symmetry is normal.  PERRLA, extraocular movements intact.  Sclerae are  clear.  NECK:  Carotids upstrokes are equal bilaterally without bruits.  There  is no JVD.  Thyroid is not enlarged.  NECK:  Supple.  LUNGS:  Clear to auscultation.  HEART:  Reveals a slightly displaced PMI, is a normal S1 without S3.  ABDOMEN:  Soft, good bowel sounds.  No midline bruits.  EXTREMITIES:  There is no cyanosis, clubbing or edema.  Pulses are  brisk.  NEURO:  Exam is intact.  SKIN:  Unremarkable.   I had a long talk with [name] today.  We will arrange  for him to  have an adenosine rest stress Myoview, to follow up on his drug-eluting  stent to the left main and intermediate branch of the circumflex.  Assuming this is stable, will see him back in 6 months.     Thomas C. Daleen Squibb, MD, [facility]  Electronically Signed    TCW/MedQ  DD: 02/27/2008  DT: 02/27/2008  Job #: [id number]

## 2011-04-11 NOTE — Assessment & Plan Note (Signed)
[facility]                            CARDIOLOGY OFFICE NOTE   Billy Bush                        MRN:          [id number]  DATE:08/27/2008                            DOB:          [birth year]    [name] comes in today for followup.  Please see my note of February 27, 2008, for his problem list.   He is doing remarkably well.  He has joined a Psychologist, forensic and is doing  treadmill and isotonic weights several days a week.  He is having no  major dyspnea on exertion or chest discomfort.  Denies any orthopnea,  PND, or peripheral edema.  His weight is actually down a few pounds.   He was seen in the Electrophysiology Office for defibrillator followup.  This is stable.   His last stress Myoview was March 09, 2008.  He had an EF of 33% with  scar in the anterior apical wall, but no change from previous study.   His medications are unchanged since his last visit except that he is no  longer on Nu-Iron.   His have blood work done by Dr. Rennis Harding.   PHYSICAL EXAMINATION:  VITAL SIGNS:  His Blood pressure today is 120/68,  his pulse is 64 and regular, his weight is 192.  HEENT:  Normal.  NECK:  Carotid upstrokes were equal bilaterally without bruits.  No JVD.  Thyroid is not enlarged.  Trachea is midline.  LUNGS:  Clear.  HEART:  Reveals a nondisplaced PMI.  Normal S1 and S2.  No gallop.  ABDOMEN:  Soft, no pulsatile mass.  No hepatomegaly.  EXTREMITIES:  No cyanosis, clubbing, or edema.  Pulses are intact.  NEURO:  Intact.  SKIN:  Shows few ecchymoses.   ASSESSMENT/PLAN:  Billy Bush is doing remarkably well.  We will make no  changes in his program today.  We will plan on seeing him back in 6  months.     Billy C. Daleen Squibb, MD, [facility]  Electronically Signed    TCW/MedQ  DD: 08/27/2008  DT: 08/27/2008  Job #: [id number]

## 2011-04-11 NOTE — Assessment & Plan Note (Signed)
[facility]                         ELECTROPHYSIOLOGY OFFICE NOTE   CORDAE, MCCAREY                        MRN:          [id number]  DATE:04/13/2008                            DOB:          [birth year]    HISTORY OF PRESENT ILLNESS:  Mr. Mccaslin returns today for follow-up.  He  is a very pleasant 70 year old patient of Dr. Vern Claude and Dr. Maahs Hugh  who has a history of ischemic cardiomyopathy, class I to II congestive  heart failure, who is status post ICD insertion.  He returns today for  follow-up.  He has really been quite stable.  He is exercising  regularly.  He walks he does not have any problems with him with his  activity or exertion.  He states that he helped mow his church for  several hours, several days ago.  He has had no intercurrent IC  therapies.   MEDICATIONS:  1. Lasix 80 a day.  2. Altace five a day.  3. Vytorin 10/80 a day.  4. Coreg 12.5 twice daily.  5. Aldactone 25 a day.  6. Humalog insulin.  7. Aspirin 325 a day.  8. Plavix 75 a day.   PHYSICAL EXAMINATION:  GENERAL:  He is a pleasant, well-appearing, 62-  year-old man in no acute distress.  VITAL SIGNS:  Blood pressure was 102/62 the pulse 64 and regular,  respirations were 18.  Weight was 194 pounds.  NECK:  Revealed no jugular distention.  There is no thyromegaly.  LUNGS:  Clear bilaterally to auscultation.  No wheezes, rales or rhonchi  are present.  CARDIOVASCULAR:  Regular rate and rhythm.  Normal S1-S2.  No murmurs,  rubs, gallops.  ABDOMEN:  Soft, nontender.  No organomegaly.  EXTREMITIES:  Demonstrate no cyanosis, clubbing or edema.  Pulses 2+ and  symmetric.   Interrogation of his defibrillator demonstrates a St. Jude current.  The  R-waves were 6, the impedance 390, the threshold 0.75 and 0.5.  The  battery voltage was greater than 3.2 volts.  He had an 8 year estimated  longevity.  There are no intercurrent IC therapies.   IMPRESSION:  1. Ischemic  cardiomyopathy.  2. Congestive heart failure class I to II.  3. Status post ICD insertion.   DISCUSSION:  Overall, Mr. [place] is stable and his defibrillator is  working normally.  His heart failure is well-controlled.  He is having  no active anginal symptoms.  I have recommended that  he continue his current medical therapy.  Continue his very nice active  lifestyle and follow up with [place] in ICD clinic in one year.     Doylene Canning. Ladona Ridgel, MD  Electronically Signed    GWT/MedQ  DD: 04/13/2008  DT: 04/13/2008  Job #: [id number]

## 2011-04-11 NOTE — Assessment & Plan Note (Signed)
[facility]                         ELECTROPHYSIOLOGY OFFICE NOTE   [name], [name]                        MRN:          [id number]  DATE:07/22/2007                            DOB:          September 01, 1941    [name] was seen today in the clinic on July 22, 2007 for a wound  check of his newly implanted Guy. Jude, model [id number] current.  Date of  implant was July 05, 2007 for ischemic cardiomyopathy.   On interrogation of his device today, his battery voltage is greater  than 3.20 with a charge time of 9.6 seconds.  R waves measured 9.8 mV  with a ventricular capture threshold of 0.75 volts at 0.5 ms and a  ventricular lead impedance of 530 ohm.  Shock impedance is 43.   There were no episodes since implant date.  No changes were made in his  parameters.  His Steri-Strips were removed today.  His wound is without  redness or edema.  He will be seen again in three months time.      Billy Harm, LPN  Electronically Signed      Billy Bush. Ladona Ridgel, MD  Electronically Signed   PO/MedQ  DD: 07/22/2007  DT: 07/22/2007  Job #: [id number]

## 2011-04-11 NOTE — Discharge Summary (Signed)
NAME:  Billy Bush, Billy Bush NO.:  [id number]   MEDICAL RECORD NO.:  [id number]          PATIENT TYPE:  OIB   LOCATION:  6531                         FACILITY:  [facility]   PHYSICIAN:  Nicolasa Ducking, ANP DATE OF BIRTH:  09-Mar-1941   DATE OF ADMISSION:  05/23/2007  DATE OF DISCHARGE:  05/24/2007                               DISCHARGE SUMMARY   PRIMARY CARDIOLOGIST:  Maisie Fus C. Wall, MD, [facility].   PRIMARY CARE Meilah Delrosario:  Gregary Signs A. Everardo All, MD.   DISCHARGE DIAGNOSIS:  Unstable angina/coronary artery disease.   SECONDARY DIAGNOSES:  1. Status post coronary artery bypass graft x3 with a left internal      mammary artery to the left anterior descending, vein graft to the      obtuse marginal, vein graft to the posterior descending artery in      July 1997.  2. History of severe mitral regurgitation status post 35 Cosgrove ring      July 1997.  3. Ischemic cardiomyopathy.  4. Chronic systolic congestive heart failure.  5. Hypertension.  6. Hyperlipidemia.  7. Type 2 diabetes mellitus.   ALLERGIES:  No known drug allergies.   PROCEDURES:  Left heart cardiac catheterization with successful PCI and  stenting of the protected left main with placement of a 3.5 x 13 mm  Cypher drug-eluting stent.   HISTORY OF PRESENT ILLNESS:  A 70 year old Caucasian married male with  prior history of CAD status post CABG and MVR in 1997 who was in his  usual state of health until several days prior to admission when he  began to experience periodic episodes of profound weakness and nausea.  He had a recurrent symptom early in the morning hours on May 21, 2007,  and saw Dr. Daleen Squibb in clinic that day.  It was felt that his symptoms may  represent an anginal equivalent and he was scheduled for left heart  cardiac catheterization.   HOSPITAL COURSE:  The patient presented to the cath lab on May 22, 2007, where left heart cardiac catheterization was performed revealing  patent grafts x3.  He  had native multivessel with an 80% stenosis in the  left main.  He had a large ramus intermedius that was normal and was  felt to be jeopardized by the left main stenosis.  After review of films  with Dr. Excell Seltzer, decision was made to perform PCI and stenting of the  left main with placement of a 3.5 x 13 mm Cypher drug-eluting stent.  The patient tolerated this procedure well and has been ambulating post  procedure without recurrent symptoms or difficulties.  He is being  discharged home today in satisfactory condition.   DISCHARGE LABS:  Hemoglobin 12, hematocrit 35.1, WBC 7.9, platelets  165,000, MCV 91.6.  Sodium 136, potassium 4, chloride 103, CO2 of 27,  BUN 18, creatinine 1.08, glucose 188.  CK 80, MB 1.4, troponin I 0.02.  Calcium 8.6.   DISPOSITION:  The patient is being discharged home today in good  condition.   FOLLOWUP PLANS AND APPOINTMENTS:  1. He is to have a followup 2D echocardiogram  on June 11, 2007 at 8:30      a.m.  2. He is to have a followup appointment with Dr. Daleen Squibb on June 18, 2007      at 1:45 p.m.  3. He is asked to follow up with Dr. Everardo All as previously scheduled.   DISCHARGE MEDICATIONS:  1. Aspirin 325 mg daily.  2. Plavix 75 mg daily x1 year.  3. Altace 5 mg daily.  4. Vytorin 10/80 mg daily.  5. Coreg 12.5 mg b.i.d.  6. Lasix 80 mg daily.  7. Spironolactone 25 mg daily.  8. Amaryl 1 mg daily.  9. Humalog as previously prescribed.  10.Coenzyme Q10 daily.  11.Glucosamine 1500 mg daily.  12.Chondroitin 1200 mg daily.  13.Iron 50 mg daily.  14.Nitroglycerin 0.4 mg sublingual p.r.n. chest pain.   OUTSTANDING LAB STUDIES:  None.   DURATION OF DISCHARGE ENCOUNTER:  45 minutes including physician time.      Nicolasa Ducking, ANP     CB/MEDQ  D:  05/24/2007  T:  05/25/2007  Job:  [id number]   cc:   Gregary Signs A. Everardo All, MD

## 2011-04-11 NOTE — Assessment & Plan Note (Signed)
[facility]                         ELECTROPHYSIOLOGY OFFICE NOTE   Billy Bush                        MRN:          [id number]  DATE:06/24/2007                            DOB:          1941/03/23    Billy Bush was referred today by Dr. Valera Castle for consideration of  ICD implantation.   HISTORY OF PRESENT ILLNESS:  The patient is a very pleasant middle-aged  man with a history of coronary artery disease, status post bypass  surgery in the past.  The patient was hospitalized back in June.  At  that time, underwent left main stenting to a large intermediate branch  of the left circumflex.  The patient has been stable from an anginal  perspective.  He underwent repeat echocardiogram hoping that his LV  function would improve, but his EF was in fact 20% with apical and  anteroseptal akinesis and intraventricular dyskinesis.  The patient has  class II heart failure symptoms.  He has never had syncope.  He denies  tachy palpitations.   His additional past medical history is notable for hypertension.  He  also has a history of diabetes with some peripheral neuropathy.  He has  a remote tobacco use smoking history.  He has a remote history of  anemia.   His surgical history is notable for a bypass in July, 1997.  He also has  dyslipidemia.   His family history is noncontributory.   SOCIAL HISTORY:  The patient has a history of tobacco use in the past,  none recently.  He denies alcohol use.  He works as a Statistician.   MEDICATIONS:  1. Amaryl 1 mg daily.  2. Lasix 80 mg daily.  3. Altace 5 mg daily.  4. Vytorin 10/80.  5. Coreg 12.5 twice daily.  6. Aldactone 125 daily.  7. Insulin daily.  8. Multivitamins.  9. Plavix 75 mg daily.   His review of systems was negative, except as noted in the HPI.   PHYSICAL EXAMINATION:  He is a pleasant middle-aged man in no acute  distress.  The blood pressure was 118/72.  Pulse was 61 and  regular.  The  respirations were 18.  Weight was 216 pounds.  HEENT:  Normocephalic and atraumatic.  Pupils are equal and round.  Oropharynx is moist.  Sclerae are anicteric.  NECK:  No jugular venous distention.  There is no thyromegaly.  Trachea  is midline.  The carotids are 2+ and symmetric.  LUNGS:  Clear bilaterally to auscultation.  No wheezes, rales or rhonchi  were appreciated.  There is no increased work of breathing.  CARDIOVASCULAR:  Regular rate and rhythm with a normal S1 and S2.  There  are no murmurs, rubs or gallops present.  The PMI was laterally  displaced.  There is a soft LV heave.  ABDOMEN:  Soft, nontender, nondistended.  There is no organomegaly.  Bowel sounds are present.  There is no rebound or guarding.  EXTREMITIES:  No clubbing, cyanosis or edema.  Pulses are 2+ and  symmetric.  NEUROLOGIC:  Alert and oriented x3 with cranial  nerves intact.  The  strength was 5/5 and symmetric.   The EKG demonstrates sinus rhythm with nonspecific T wave abnormality.   IMPRESSION:  1. Ischemic cardiomyopathy.  2. Congestive heart failure, class II.  3. Diabetes.  4. Hypertension.  5. Dyslipidemia.   DISCUSSION:  Overall, Billy Bush is stable but has clear indication for  prophylactic ICD implantation.  The risks, benefits, goals, and  expectations of the procedure have been discussed, and he wishes to  proceed.  This will be scheduled at the earliest possible convenient  time.     Doylene Canning. Ladona Ridgel, MD  Electronically Signed    GWT/MedQ  DD: 06/24/2007  DT: 06/25/2007  Job #: [id number]

## 2011-04-11 NOTE — Discharge Summary (Signed)
NAME:  Billy Bush, Billy Bush NO.:  [id number]   MEDICAL RECORD NO.:  [id number]          PATIENT TYPE:  INP   LOCATION:  4708                         FACILITY:  [facility]   PHYSICIAN:  Doylene Canning. Ladona Ridgel, MD    DATE OF BIRTH:  [birth year]   DATE OF ADMISSION:  07/05/2007  DATE OF DISCHARGE:  07/06/2007                               DISCHARGE SUMMARY   ALLERGIES:  This patient has no known drug allergies.   This dictation was greater than 35 minutes.   FINAL DIAGNOSES:  Discharging day #1 status post implant of St. Jude  CURRENT single-chamber cardioverter defibrillator with defibrillator  threshold study less than or equal to 25 joules.   SECONDARY DIAGNOSES.:  1. Ischemic cardiomyopathy, history of prior myocardial infarction in      July 1997.  2. Ejection fraction 20-25% (on followup echocardiogram).  3. History of three-vessel coronary artery disease status post      coronary artery bypass graft surgery x3, July 1997.  4. Mitral valve annuloplasty July 1997.  5. Recent catheterization June 2008.  This study showed distal left      main stenosis of 80% which was compromising flow to the ramus      intermediate which had not been bypassed. Ejection fraction at      catheterization 30%.  6. Percutaneous coronary intervention to the left main May 23, 2007.  7. Myoview study July 2007, negative for ischemia.  Ejection fraction      33%.  8. New York Heart Association Class II chronic, systolic congestive      heart failure symptoms.  9. Diabetes.  10.Hypertension.  11.Peripheral neuropathy.  12.Dyslipidemia.  13.Family history of coronary artery disease.   BRIEF HISTORY:  Billy Bush is a 70 year old male.  He has known history  of coronary artery disease having had a myocardial infarction July 1997  with subsequent coronary artery bypass graft surgery as well as mitral  valve annuloplasty for 3+ mitral regurgitation.   The patient had recurrent symptoms of fatigue  and nausea in June and was  hospitalized for catheterization.  This study showed left main disease  which was stented, liberating flow to a ramus intermediate branch.  The  patient has been stable from an anginal prospective   The patient had a repeat echocardiogram with the hope that his left  ventricular function would improve. The ejection fraction turned out to  be 20%.   The patient has New York Heart Association Class II chronic systolic  congestive heart failure symptoms.  He has never had syncope.  Denies  tachy palpitations. His additional medical history includes  hypertension, diabetes, family history of coronary artery disease,  remote tobacco habituation.  The patient qualifies for cardioverter-  defibrillator implant for primary prevention. The risks, goals and  benefits have been discussed with the patient.  This will be done on an  elective basis at the earliest opportunity.   HOSPITAL COURSE:  The patient presented electively July 06, 2007. He  underwent implantation of a St. Jude single-chamber cardioverter-  defibrillator the same day. He has had no  postprocedural complications.  He is in sinus rhythm.  His incision shows no evidence of hematoma or  erythema.  Chest x-ray will be examined prior to discharge. The device  will be interrogated prior to discharge.  Mobility of the left arm and  incision care have been discussed with the patient.  The patient will  discharge August 9 on the following medications.  1. Plavix 75 mg daily.  2. Enteric-coated aspirin 325 mg daily.  3. Amaryl 1 mg daily.  4. Lasix 80 mg daily.  5. Altace 5 mg daily.  6. Vytorin 10/80 daily.  7. Coreg 12.5 mg twice daily.  8. Spironolactone 25 mg daily.  9. Humalog from insulin pen, 16 units with breakfast, 12 units with      lunch, 35 units at dinner, and 5 units at bedtime.  The patient is      able to adjust these according to his carbohydrate intake.  10.He is also to continue his  coenzyme, his multivitamins,      glucosamine, and his chondroitin.   He follows up at [facility] in the ICD clinic Monday, August 25,  at 9:20. He will see Dr. Ladona Ridgel December 2008; Dr. Lubertha Basque office will  call.   Lab studies pertinent to this admission were drawn August 5:  White  cells of 7.3, hemoglobin 12.2, hematocrit 35.1 and platelets of 162.  Pro time was 12.2, INR 1.0. Sodium 139, potassium 4.3, chloride 105,  carbonate 29, glucose 218, BUN 14, creatinine 1.      [name], PA      Doylene Canning. Ladona Ridgel, MD  Electronically Signed    GM/MEDQ  D:  07/05/2007  T:  07/06/2007  Job:  [id number]   cc:   Gregary Signs A. Everardo All, MD  Jesse Sans Wall, MD, [facility]

## 2011-04-11 NOTE — Assessment & Plan Note (Signed)
[facility]                            CARDIOLOGY OFFICE NOTE   Billy Bush, Billy Bush                        MRN:          [id number]  DATE:09/03/2007                            DOB:          10-Sep-1941    Billy Bush returns today for further management of the following issues:  1. Ischemic cardiomyopathy, status post left main stenting to a large      intermediate branch to the left circumflex.  Date May 23, 2007.      This was a drug-eluting stent.  2. Left ventricular dysfunction, ejection fraction 20% - 25%.  3. Status post recent implantable cardioverter defibrillator      implantation, see Dr. Lubertha Basque  recent note.  4. Class II congestive heart failure.  He is walking as much as 80      steps when he climbs steps at the hospital without significant      shortness of breath!  5. Hypertension.  6. Diabetes.  7. Peripheral neuropathy.  8. He has had bypass surgery in July 1997.  9. He also has hyperlipidemia.   He is feeling remarkably well.  He needs some dental work, i.e., a  cavity filled.  I told him not to stop his Plavix or his aspirin.   His medications are unchanged from his last visit.  Please refer to the  list.   His blood pressure today is 117/52, his pulse 59 and regular, his weight  is 204 down 12.  He is no acute distress, alert and oriented.  HEENT:  Unchanged.  Carotid upstrokes were equal bilaterally without  bruits, no JVD, thyroid is not enlarged, trachea is midline.  LUNGS:  Clear.  HEART:  Reveals a regular rate and rhythm without gallop.  ABDOMINAL:  Soft, good bowel sounds.  EXTREMITIES:  Reveal no edema, pulses are intact.  NEURO:  Intact.   Billy Bush is doing well.  I have asked him to continue his current  medications.  We will see him back in 6 months.  He will remain on  Plavix and aspirin despite any sort of procedures, I have made this  perfectly clear to him.     Billy C. Daleen Squibb, MD, [facility]  Electronically Signed    TCW/MedQ  DD: 09/03/2007  DT: 09/03/2007  Job #: [id number]

## 2011-04-11 NOTE — Assessment & Plan Note (Signed)
 HEALTHCARE                         ELECTROPHYSIOLOGY OFFICE NOTE   Billy Bush, Billy Bush                        MRN:          [id number]  DATE:10/29/2007                            DOB:          1940/11/30    Billy Bush returned today for followup.  He is 4 months out from his ICD  implant and doing quite well.  In the interim, he has started  exercising, and he walks regularly at the mall.  He is considering an  exercise program.   PHYSICAL EXAMINATION:  He is a pleasant, well-appearing 70 year old man.  He looks somewhat older than his staged age.  His blood pressure today is 115/68, the pulse 78 and regular.  Respirations were 18.  The weight was 195 pounds.  NECK:  No jugular venous distention.  LUNGS:  Clear bilaterally to auscultation.  No wheezes, rales or rhonchi  were present.  CARDIOVASCULAR:  Regular rate and rhythm with normal S1 and S2.  There  were no murmurs, rubs or gallops present.  EXTREMITIES:  Demonstrated no edema.   MEDICATIONS:  1. Lasix 80 a day.  2. Altace 5 a day.  3. Vytorin 10/80 a day.  4. Coreg 12.5 twice daily.  5. Aldactone 25 mg daily.  6. Humalog insulin.  7. Aspirin.  8. Plavix 75 a day.   Interrogation of his defibrillator demonstrates a St. Jude single-  chamber device.  R-waves were 9, impedance 530, the threshold 0.5 at  0.5.  Battery voltage was greater than 3.2 volts.  There were no  intercurrent ICA therapies.   IMPRESSION:  1. Ischemic cardiomyopathy.  2. Congestive heart failure, now class 1-2.  3. Status post implantable cardioverter-defibrillator insertion.   DISCUSSION:  Overall, Billy Bush is stable, and his defibrillator is  working normally.  We will plan to see him back in the office in August  2009.     Doylene Canning. Ladona Ridgel, MD  Electronically Signed   GWT/MedQ  DD: 10/29/2007  DT: 10/29/2007  Job #: [id number]

## 2011-04-11 NOTE — Assessment & Plan Note (Signed)
[facility]                            CARDIOLOGY OFFICE NOTE   TANISH, PRIEN                        MRN:          [id number]  DATE:05/21/2007                            DOB:          10-17-1941    [name] came in today as an add-on because of three episodes of  feeling profoundly weak and nauseated.   HISTORY OF PRESENT ILLNESS:  He is 70 years of age, a married white male  with known coronary disease.  He had an out-of-hospital myocardial  infarction during his initial presentation with coronary disease in  July, 1997.  At that time, he had an ejection fraction of 25-30%, severe  three vessel disease, and 3-4+ mitral regurgitation.   He underwent coronary artery bypass grafting x3 with the left internal  mammary artery graft to the LAD, vein graft to an obtuse marginal, vein  graft to the PDA, with a mitral valve repair with a #28 [place] ring by  Dr. Particia Lather.   He has done remarkably well over the last 11 years.  He has had chronic  systolic heart failure with infrequent hospitalization.  Over the last  few days, he has had three episodes where he became nauseated and  profoundly weak.  He denied any diaphoresis, chest discomfort, tachy  palpitations, or presyncope.   Last evening around 12:30, he awoke and felt this way.  He checked his  blood sugar, and it was 88.  He ate a peanut butter sandwich and a glass  of milk and felt better.  It did take a while, though, for him to feel  better.   MEDICATIONS:  1. Amaryl 100 mg daily.  2. Lasix 80 mg a day.  3. Altace 5 mg per day.  4. Vytorin 10/80 each evening.  5. Coreg 12.5 b.i.d.  6. Spironolactone 25 mg daily.  7. Humalog as directed.  8. CoEnzyme Q10 30 mg per day.  9. Aspirin 81 mg per day.  10.Multivitamin daily.  11.Glucosamine 1500 mg a day.  12.Condrotin 1200 mg per day.  13.Iron 50 mg a day.   Note that his last Adenosine Myoview was on June 21, 2006.  Showed  an EF  of 33% with anterior and apical scar but no ischemia.   A 2D echocardiogram and MUGA have also demonstrated his EF to be around  35-40%.  We have talked at length about a defibrillator, but he has  decided against this at this point.   His other medical problems include type 2 diabetes, hyperlipidemia,  hypertension.   SOCIAL HISTORY:  He is a Education officer, environmental.  He is married.  He has four children.   FAMILY HISTORY:  Notable for coronary disease in his father, who died at  age 69.   REVIEW OF SYSTEMS:  No orthopnea, PND, peripheral edema.  His exercise  tolerance is maintained.  His other review of systems are negative.   PHYSICAL EXAMINATION:  VITAL SIGNS:  His blood pressure is 136/76.  His  pulse is 64 and regular.  GENERAL:  He looks remarkably good.  His skin  color is normal and dry.  HEENT:  Normocephalic and atraumatic.  PERRLA.  Extraocular movements  are intact.  Sclerae are clear.  Facial symmetry is normal.  Dentition  is satisfactory.  NECK:  Carotids are equal bilaterally without bruits.  No JVD.  Thyroid  is not enlarged.  Trachea is midline.  LUNGS:  Clear.  HEART:  A slightly displaced PMI.  He has a normal S1 and S2 without  gallop.  ABDOMEN:  Soft with good bowel sounds.  No epigastric tenderness.  There  is no midline bruit.  EXTREMITIES:  Good femoral pulses without bruits.  Distal pulses are  +1/4+.  There is no edema.  NEUROLOGIC:  Intact.  SKIN:  Intact.   Electrocardiogram is pending.   ASSESSMENT:  I am quite concerned about these three episodes being  potentially coronary ischemia.  Not only is he diabetic but he presented  with an out-of-hospital infarct years ago.  His first recollection of  having a heart problem was coming in with congestive heart failure.  Looking back at his cath report, he had a totally occluded left anterior  descending artery, a totally occluded circumflex, a 90% right prior to  his bypass.   After a long discussion, we  have decided to proceed with an outpatient  JV procedure this Thursday.  We will ask him to hold his Amaryl and his  insulin.  We will check pre-cath lab values.  His renal failure has been  normal in the past.   The indications, risks, and potential benefits have been discussed.   We will also give him sublingual nitroglycerin with instructions on how  to use it, if he has any further episodes, as described above.  He knows  how to call 911 if he gets no response to two nitroglycerin pills.     Thomas C. Daleen Squibb, MD, [facility]  Electronically Signed    TCW/MedQ  DD: 05/21/2007  DT: 05/22/2007  Job #: [id number]   cc:   Gregary Signs A. Everardo All, MD

## 2011-04-11 NOTE — Op Note (Signed)
NAME:  DONN, ZANETTI NO.:  [id number]   MEDICAL RECORD NO.:  [id number]          PATIENT TYPE:  INP   LOCATION:  4708                         FACILITY:  [facility]   PHYSICIAN:  Doylene Canning. Ladona Ridgel, MD    DATE OF BIRTH:  Sep 17, 1941   DATE OF PROCEDURE:  07/05/2007  DATE OF DISCHARGE:                               OPERATIVE REPORT   PROCEDURE PERFORMED:  Implantation of a single-chamber defibrillator.   INDICATIONS:  Ischemic cardiomyopathy, EF 25%, class 2 heart failure.   INTRODUCTION:  The patient is a 70 year old man with a history of  coronary artery disease status post myocardial infarction status post  bypass surgery who has severe LV dysfunction with an EF of 20-25%.  He  has class 2 heart failure. He is now referred for prophylactic ICD  implantation.   PROCEDURE:  After informed consent was obtained, the patient was taken  to the diagnostic EP lab in the fasted state.  After the usual  preparation and draping, intravenous fentanyl and midazolam was given  for sedation.  A 7-cm incision was carried out over the left  deltopectoral groove and electrocautery utilized to dissect down to the  fascial plane.  The cephalic vein was dissected free and the St. Jude  Durata model 7121, 65-cm active fixation defibrillation lead serial  number [id number] was advanced into the right ventricle.  It should be  noted that mapping in the ventricle was more difficult as movement of  the lead was more difficult and we had difficulty getting the lead to  the RV apex rather using the RV outflow tract.  The lead was actively  fixed.  The R-waves were 11, the impedance 706 ohms, the threshold 0.5  at 0.5, 10 volt pacing did not stimulate the diaphragm.  There was a  nice injury current present.  With all of the above, the lead was  secured at the subpectoralis fascia with a figure-of-eight silk suture  and the sewing sleeve was secured with silk suture as well.  Electrocautery was  utilized to make a subcutaneous pocket.  Kanamycin  irrigation was utilized to irrigate the pocket and electrocautery  utilized to assure hemostasis.  The St. Jude current VRRF single-chamber  defibrillator model [phone] [id number] was connected to the defibrillator lead and  placed back in the subcutaneous pocket.  The generator was secured with  silk suture.  Additional kanamycin was then utilized to irrigate the  pocket and defibrillation threshold testing carried out.   After the patient was more deeply sedated with fentanyl and Versed, VF  was induced with a T-wave shock and a 15 joule shock was delivered which  failed to terminate VF.  A second 25 joule shock was delivered which  terminated VF and restored sinus rhythm.  Five minutes was allowed to  elapse and a second defibrillation threshold test carried out.  Again VF  was induced with a T-wave shock and again a 25 joule shock was delivered  which terminated VF and restored sinus rhythm.  At this point, no  additional defibrillation threshold testing was carried out and the  incision was closed with a layer of 2-0 Vicryl, followed by a layer of 3-  0 Vicryl, followed by a layer 4-0 Vicryl.  Benzoin was painted on the  skin, Steri-Strips were applied, a pressure dressing was placed and the  patient was returned to his room in satisfactory condition.   COMPLICATIONS:  There were no immediate procedure complications.   RESULTS:  This demonstrates successful implantation of a St. Jude single-  chamber defibrillator by way of the left cephalic vein without immediate  procedure complication.      Doylene Canning. Ladona Ridgel, MD  Electronically Signed     GWT/MEDQ  D:  07/05/2007  T:  07/05/2007  Job:  [id number]   cc:   Rollene Rotunda, MD, [facility]  Jesse Sans. Wall, MD, [facility]  Sean A. Everardo All, MD

## 2011-04-14 NOTE — Assessment & Plan Note (Signed)
[facility]                            CARDIOLOGY OFFICE NOTE   Billy Bush, [name]                        MRN:          [id number]  DATE:12/06/2006                            DOB:          March 24, 1941    Billy Bush returns today for management of the following issues.  1. Coronary artery disease with ischemic cardiomyopathy.  His EF is      35% by echo, 34% by MUGA, and was 33% by his last stress Myoview.      He has had previous coronary artery bypass surgery.  His Cardiolite      June 21, 2005 showed no ischemia but a prior anterior and apical      infarct, which is old.  We have discussed at length indications for      defibrillator, but he wants to wait.  He said if his ejection      fraction gets lower in the future, he will consider it.  2. Left bundle branch block.  3. Class II [place] Heart Association congestive heart failure.  4. Hyperlipidemia, followed by Dr. Everardo All, and is at goal with blood      work December 2007.  5. Type 2 diabetes followed by Dr. Everardo All.  His hemoglobin A1C was at      6.6%, which is 1 of the best he has had.   He denies any orthopnea, PND, peripheral edema, pre-syncope, syncope,  tachy palpitations.  He has had no angina.  He does have some dyspnea on  exertion.   MEDICATIONS:  1. Humalog.  2. Actos 15 mg a day.  3. Lasix 80 mg a day.  4. Vytorin 10/80 daily.  5. Coenzyme Q10 daily.  6. Aspirin 81 mg a day.  7. Centrum Silver daily.  8. Glucosamine 1500 mg a day.  9. Chondroitin 1200 mg a day.  10.Spironolactone 25 mg a day.  11.Amaryl 1 mg p.o. nightly.  12.Altace 5 mg a day.  13.Coreg 12.5 b.i.d.   The rest of his review of systems are negative.   EXAM:  Blood pressure is 124/67, heart rate is 64 and he is in sinus  rhythm with an interventricular conduction delay with lateral ST segment  changes.  His weight is 215, down a pound.  HEENT:  Normocephalic, atraumatic.  PERRLA.  Extraocular  movements  intact.  Sclerae clear.  Facial symmetry is normal.  Carotid upstrokes are equal bilaterally without bruits.  There is no  JVD.  Thyroid is not enlarged.  Trachea is midline.  CHEST:  Sternotomy site is stable.  He has a displaced PMI.  He has  normal S1, S2 without gallop.  LUNGS:  Clear.  ABDOMEN:  Soft with good bowel sounds.  There is no midline bruit.  No  hepatomegaly.  EXTREMITIES:  No edema.  Pulses are present.  NEURO:  Intact.  SKIN:  Reveals numerous cherry angiomas, some spider angiomas, and also  multiple moles that look benign.  He has got bruises on both hands.  MUSCULOSKELETAL:  Shows some chronic arthritic changes.   His EKG today  is normal sinus rhythm with ST segment changes in I, AVL,  V and VI with some interventricular conduction delay.   ASSESSMENT AND PLAN:  Billy Bush is stable from a cardiovascular  standpoint.  He is on an excellent medical program.  I am delighted that  his blood sugar is under tighter control.  His lipids are at goal.  He  still does not want to consider defibrillator.  We will follow up in  July, at which time we will do a 2D echocardiogram.  If his ejection  fraction is lower we will push harder for this, and he seems to be more  open to that if that is the case.     Thomas C. Daleen Squibb, MD, [facility]  Electronically Signed    TCW/MedQ  DD: 12/06/2006  DT: 12/06/2006  Job #: [id number]

## 2011-04-14 NOTE — Assessment & Plan Note (Signed)
[facility]                              CARDIOLOGY OFFICE NOTE   BOLDEN, HAGERMAN                        MRN:          [id number]  DATE:06/12/2006                            DOB:          10/02/1941    [name] returns today for further management of the following issues:  1.  Coronary artery disease with an ischemic cardiomyopathy.  His EF is 34%      by MUGA December 13, 2005.  He has had previous coronary bypass surgery.      He has got a left bundle branch block.  Please refer to previous notes      for details.  The MUGA showed anterior septal and apical scar from his      previous infarct.  This has also been seen on a 2-D echo within the past      year.   He has currently class 2 symptoms of congestive heart failure.  He is having  no ischemic symptoms, per se.  His last stress Myoview was October 2005.   1.  Hyperlipidemia followed by Dr. Everardo All and at goal.  2.  Type 2 diabetes being followed by Dr. Everardo All with a relatively good      hemoglobin 81c.   His meds are unchanged since last visit.  He seems to have benefited  symptomatically from the spironolactone.  His magnesium and his potassium  were normal in January.   PHYSICAL EXAMINATION:  His blood pressure today is 106/63, pulse 66 and  regular.  Weight 216, down 6.  He is in no acute distress.  SKIN:  Warm and dry.  Carotids are full without JVD and no bruits.  Thyroid is not enlarged.  LUNGS:  Clear.  HEART:  No S3 gallop.  ABDOMEN:  Soft.  Good bowel sounds.  There is no midline bruit.  There is no  hepatomegaly.  EXTREMITIES:  No edema.  Pulses are intact.   ASSESSMENT AND PLAN:  [name] is stable.  He is due a perfusion study,  which we will arrange.  He will follow up with Dr. Everardo All and with blood  work in November.  We will plan on seeing him back in six months.                               Thomas C. Daleen Squibb, MD, [facility]    TCW/MedQ  DD:  06/12/2006  DT:   06/13/2006  Job #:  [id number]   cc:   Gregary Signs A. Everardo All, MD

## 2011-05-04 ENCOUNTER — Other Ambulatory Visit: Payer: Self-pay | Admitting: Cardiology

## 2011-06-23 ENCOUNTER — Other Ambulatory Visit: Payer: Self-pay | Admitting: Cardiology

## 2011-06-29 ENCOUNTER — Encounter: Payer: BC Managed Care – PPO | Admitting: *Deleted

## 2011-07-04 ENCOUNTER — Other Ambulatory Visit: Payer: BC Managed Care – PPO

## 2011-07-04 ENCOUNTER — Ambulatory Visit: Payer: BC Managed Care – PPO

## 2011-07-04 DIAGNOSIS — E119 Type 2 diabetes mellitus without complications: Secondary | ICD-10-CM

## 2011-07-04 LAB — HEMOGLOBIN A1C: Hgb A1c MFr Bld: 6.7 % — ABNORMAL HIGH (ref 4.6–6.5)

## 2011-07-06 ENCOUNTER — Ambulatory Visit (INDEPENDENT_AMBULATORY_CARE_PROVIDER_SITE_OTHER): Payer: BC Managed Care – PPO | Admitting: *Deleted

## 2011-07-06 DIAGNOSIS — Z95810 Presence of automatic (implantable) cardiac defibrillator: Secondary | ICD-10-CM

## 2011-07-06 DIAGNOSIS — I5022 Chronic systolic (congestive) heart failure: Secondary | ICD-10-CM

## 2011-07-06 DIAGNOSIS — I428 Other cardiomyopathies: Secondary | ICD-10-CM

## 2011-07-06 LAB — ICD DEVICE OBSERVATION
BATTERY VOLTAGE: 3.0682 V
BRDY-0002RV: 40 {beats}/min
DEVICE MODEL ICD: 484050
FVT: 0
TOT-0007: 2
TOT-0010: 18
TZAT-0001FASTVT: 1
TZAT-0001SLOWVT: 1
TZAT-0004SLOWVT: 8
TZAT-0013FASTVT: 2
TZAT-0018SLOWVT: NEGATIVE
TZAT-0020FASTVT: 1 ms
TZON-0005FASTVT: 6
TZON-0010SLOWVT: 80 ms
TZST-0001FASTVT: 2
TZST-0001FASTVT: 5
TZST-0001SLOWVT: 2
TZST-0001SLOWVT: 5
TZST-0003FASTVT: 36 J
TZST-0003FASTVT: 36 J
TZST-0003FASTVT: 36 J
TZST-0003SLOWVT: 25 J
TZST-0003SLOWVT: 36 J
VENTRICULAR PACING ICD: 0.32 pct
VF: 0

## 2011-07-06 NOTE — Progress Notes (Signed)
icd check

## 2011-07-07 ENCOUNTER — Ambulatory Visit (INDEPENDENT_AMBULATORY_CARE_PROVIDER_SITE_OTHER): Payer: BC Managed Care – PPO | Admitting: Endocrinology

## 2011-07-07 ENCOUNTER — Encounter: Payer: Self-pay | Admitting: Endocrinology

## 2011-07-07 VITALS — BP 112/78 | HR 59 | Temp 97.9°F | Ht 67.0 in | Wt 201.2 lb

## 2011-07-07 DIAGNOSIS — E109 Type 1 diabetes mellitus without complications: Secondary | ICD-10-CM

## 2011-07-07 NOTE — Progress Notes (Signed)
Subjective:    Patient ID: Billy Bush, male    DOB: 03-28-1941, 70 y.o.   MRN: [id number]  HPI no cbg record, but states cbg's are sometimes low with exertion.  pt states he feels well in general. Past Medical History  Diagnosis Date  . Dyslipidemia   . Hypertension   . History of colonic polyps   . Thrombocytopenia   . Nonproliferative diabetic retinopathy NOS   . Impotence of organic origin   . Iron deficiency anemia, unspecified   . Special screening for malignant neoplasm of prostate   . Routine general medical examination at a health care facility   . Unspecified hereditary and idiopathic peripheral neuropathy   . Hyperlipidemia   . Diabetes mellitus   . Depressive disorder, not elsewhere classified   . Coronary artery disease   . CHF (congestive heart failure)   . Anxiety state, unspecified     Past Surgical History  Procedure Date  . Coronary artery bypass graft     X 3  . Cardiac catheterization 05/23/07  . Insert / replace / remove pacemaker 07/05/07    St. JUDE SINGLE-CHAMBER DEFIBRILLATOR, Billy Bunting, MD    History   Social History  . Marital Status: Married    Spouse Name: N/A    Number of Children: 4  . Years of Education: N/A   Occupational History  . PASTOR    Social History Main Topics  . Smoking status: Former Smoker    Types: Cigarettes    Quit date: 11/27/1996  . Smokeless tobacco: Not on file  . Alcohol Use: No  . Drug Use: No  . Sexually Active:    Other Topics Concern  . Not on file   Social History Narrative   CHURCH PASTORMARRIED4 BOYSFORMER SMOKER, QUIT 14 YRSALCOHOL USE -NONO ILLICIT DRUG USEICD-St. JUDEPATIENT SIGNED A DESIGNATED PARTY RELEASE TO ALLOW HIS WIFE, Billy Bush, TO HAVE ACCESS TO HIS MEDICAL RECORDS/INFORMATION. Billy Bush, March 21, 2010 9:42 AM    Current Outpatient Prescriptions on File Prior to Visit  Medication Sig Dispense Refill  . aspirin 325 MG EC tablet Take 325 mg by mouth daily.        Marland Kitchen  atorvastatin (LIPITOR) 80 MG tablet Take 80 mg by mouth daily.        . carvedilol (COREG) 12.5 MG tablet Take 12.5 mg by mouth 2 (two) times daily with a meal.        . co-enzyme Q-10 30 MG capsule Take 30 mg by mouth daily.        . furosemide (LASIX) 80 MG tablet Take 80 mg by mouth daily.        Marland Kitchen glucose blood (ONE TOUCH ULTRA TEST) test strip 1 each by Other route as needed. Use as instructed       . Insulin Lispro, Human, (HUMALOG KWIKPEN [place]) 3 TIMES DAILY BEFORE EACH MEAL 11-04-27 UNITS,      . insulin NPH (HUMULIN N PEN) 100 UNIT/ML injection Inject 5 Units into the skin at bedtime.  10 mL  11  . Multiple Vitamin (MULTIVITAMIN) tablet Take 1 tablet by mouth daily.        Marland Kitchen NITROSTAT 0.4 MG SL tablet DISSOLVE 1 TABLET UNDER TOUNGUE AT ONSET OF CHEST PAIN, MAY REPEAT EVERY 5 MINUTES FOR UP TO 3 DOSES  25 tablet  1  . PLAVIX 75 MG tablet TAKE 1 TABLET BY MOUTH ONCE DAILY  90 tablet  2  . ramipril (ALTACE) 5 MG capsule  Take 1 capsule (5 mg total) by mouth daily.  90 capsule  3  . spironolactone (ALDACTONE) 25 MG tablet Take 25 mg by mouth daily.          No Known Allergies  No family history on file.  BP 112/78  Pulse 59  Temp(Src) 97.9 F (36.6 C) (Oral)  Ht 5\' 7"  (1.702 m)  Wt 201 lb 3.2 oz (91.264 kg)  BMI 31.51 kg/m2  SpO2 98%    Review of Systems Denies loc.    Objective:   Physical Exam no deformity.  no ulcer on the feet.  feet are of normal temp.  1+ bilat leg edema there are bilat varicosities, and rust-color patches.  there is a vein harvest scar at the left leg.  dorsalis pedis intact bilat.         Assessment & Plan:

## 2011-07-07 NOTE — Patient Instructions (Addendum)
please continue the same humalog.  However, if exercise is upcoming, take just 1/2 of your humalog. continue nph insulin, 5 units at bedtime. Please make a follow-up appointment in 3 months, with blood tests about 1 week prior. good diet and exercise habits significanly improve the control of your diabetes.  please let me know if you wish to be referred to a dietician.  high blood sugar is very risky to your health.  you should see an eye doctor every year. controlling your blood pressure and cholesterol drastically reduces the damage diabetes does to your body.  this also applies to quitting smoking.  please discuss these with your doctor.  you should take an aspirin every day, unless you have been advised by a doctor not to.

## 2011-08-11 ENCOUNTER — Other Ambulatory Visit: Payer: Self-pay | Admitting: Endocrinology

## 2011-09-06 ENCOUNTER — Encounter: Payer: Self-pay | Admitting: Internal Medicine

## 2011-09-06 ENCOUNTER — Ambulatory Visit (INDEPENDENT_AMBULATORY_CARE_PROVIDER_SITE_OTHER): Payer: BC Managed Care – PPO | Admitting: *Deleted

## 2011-09-06 DIAGNOSIS — I428 Other cardiomyopathies: Secondary | ICD-10-CM

## 2011-09-06 LAB — ICD DEVICE OBSERVATION
BRDY-0002RV: 40 {beats}/min
DEVICE MODEL ICD: 484050
FVT: 0
TOT-0007: 2
TZAT-0001SLOWVT: 1
TZAT-0004SLOWVT: 8
TZAT-0012FASTVT: 200 ms
TZAT-0013FASTVT: 2
TZAT-0018SLOWVT: NEGATIVE
TZON-0003SLOWVT: 340 ms
TZON-0010SLOWVT: 80 ms
TZST-0001FASTVT: 2
TZST-0001FASTVT: 5
TZST-0001SLOWVT: 3
TZST-0001SLOWVT: 5
TZST-0003FASTVT: 36 J
TZST-0003FASTVT: 36 J
TZST-0003FASTVT: 36 J
TZST-0003SLOWVT: 25 J
TZST-0003SLOWVT: 36 J
VENTRICULAR PACING ICD: 0.61 pct
VF: 0

## 2011-09-06 NOTE — Progress Notes (Signed)
icd check in clinic  

## 2011-09-12 ENCOUNTER — Encounter: Payer: Self-pay | Admitting: Cardiology

## 2011-09-12 ENCOUNTER — Ambulatory Visit (INDEPENDENT_AMBULATORY_CARE_PROVIDER_SITE_OTHER): Payer: BC Managed Care – PPO | Admitting: Cardiology

## 2011-09-12 DIAGNOSIS — I25810 Atherosclerosis of coronary artery bypass graft(s) without angina pectoris: Secondary | ICD-10-CM

## 2011-09-12 DIAGNOSIS — I10 Essential (primary) hypertension: Secondary | ICD-10-CM

## 2011-09-12 DIAGNOSIS — I080 Rheumatic disorders of both mitral and aortic valves: Secondary | ICD-10-CM

## 2011-09-12 DIAGNOSIS — I447 Left bundle-branch block, unspecified: Secondary | ICD-10-CM

## 2011-09-12 DIAGNOSIS — I2510 Atherosclerotic heart disease of native coronary artery without angina pectoris: Secondary | ICD-10-CM

## 2011-09-12 DIAGNOSIS — Z95810 Presence of automatic (implantable) cardiac defibrillator: Secondary | ICD-10-CM

## 2011-09-12 DIAGNOSIS — I5022 Chronic systolic (congestive) heart failure: Secondary | ICD-10-CM

## 2011-09-12 DIAGNOSIS — E785 Hyperlipidemia, unspecified: Secondary | ICD-10-CM

## 2011-09-12 NOTE — Assessment & Plan Note (Signed)
Stable. Repeat echocardiogram to assess LV function.

## 2011-09-12 NOTE — Assessment & Plan Note (Signed)
Stable. Continue medical therapy 

## 2011-09-12 NOTE — Assessment & Plan Note (Signed)
Clinically stable. Repeat echocardiogram to assess degree of mitral regurgitation.

## 2011-09-12 NOTE — Progress Notes (Signed)
HPI [name] comes in today for evaluation and management his coronary disease, chronic systolic heart failure, hypertension, And mild to moderate mitral regurgitation.  As usual, he is upbet with no complaints. His weight has been stable. He denies orthopnea, PND or edema. He denies palpitations and as her brother has not fired.  He's very compliant with his medications and diet.     Past Medical History  Diagnosis Date  . Dyslipidemia   . Hypertension   . History of colonic polyps   . Thrombocytopenia   . Nonproliferative diabetic retinopathy NOS   . Impotence of organic origin   . Iron deficiency anemia, unspecified   . Special screening for malignant neoplasm of prostate   . Routine general medical examination at a health care facility   . Unspecified hereditary and idiopathic peripheral neuropathy   . Hyperlipidemia   . Diabetes mellitus   . Depressive disorder, not elsewhere classified   . Coronary artery disease   . CHF (congestive heart failure)   . Anxiety state, unspecified     Past Surgical History  Procedure Date  . Coronary artery bypass graft     X 3  . Cardiac catheterization 05/23/07  . Insert / replace / remove pacemaker 07/05/07    St. JUDE SINGLE-CHAMBER DEFIBRILLATOR, Billy Bunting, MD    No family history on file.  History   Social History  . Marital Status: Married    Spouse Name: N/A    Number of Children: 4  . Years of Education: N/A   Occupational History  . PASTOR    Social History Main Topics  . Smoking status: Former Smoker    Types: Cigarettes    Quit date: 11/27/1996  . Smokeless tobacco: Not on file  . Alcohol Use: No  . Drug Use: No  . Sexually Active:    Other Topics Concern  . Not on file   Social History Narrative   CHURCH PASTORMARRIED4 BOYSFORMER SMOKER, QUIT 14 YRSALCOHOL USE -NONO ILLICIT DRUG USEICD-St. JUDEPATIENT SIGNED A DESIGNATED PARTY RELEASE TO ALLOW HIS WIFE, Billy Bush Mix, TO HAVE ACCESS TO HIS MEDICAL  RECORDS/INFORMATION. [name], March 21, 2010 9:42 AM    No Known Allergies  Current Outpatient Prescriptions  Medication Sig Dispense Refill  . aspirin 325 MG EC tablet Take 325 mg by mouth daily.        Marland Kitchen atorvastatin (LIPITOR) 40 MG tablet Take 40 mg by mouth daily.        . carvedilol (COREG) 12.5 MG tablet Take 12.5 mg by mouth 2 (two) times daily with a meal.        . co-enzyme Q-10 30 MG capsule Take 30 mg by mouth daily.        . furosemide (LASIX) 80 MG tablet Take 80 mg by mouth daily.        . Glucosamine HCl 1500 MG TABS Take 1 tablet by mouth daily.        Marland Kitchen HUMALOG KWIKPEN 100 UNIT/ML injection INJECT SUBCUTANEOUSLY 12 UNITS BEFORE BREAKFAST , 8 UNITS BEFORE LUNCH , AND 28 UNITS BEFORE DINNER  25 mL  2  . insulin NPH (HUMULIN N PEN) 100 UNIT/ML injection Inject 5 Units into the skin at bedtime.  10 mL  11  . Multiple Vitamin (MULTIVITAMIN) tablet Take 1 tablet by mouth daily.        Marland Kitchen NITROSTAT 0.4 MG SL tablet DISSOLVE 1 TABLET UNDER TOUNGUE AT ONSET OF CHEST PAIN, MAY REPEAT EVERY 5 MINUTES FOR  UP TO 3 DOSES  25 tablet  1  . ONE TOUCH ULTRA TEST test strip TEST 2 TIMES A DAY  200 each  2  . PLAVIX 75 MG tablet TAKE 1 TABLET BY MOUTH ONCE DAILY  90 tablet  2  . ramipril (ALTACE) 5 MG capsule Take 1 capsule (5 mg total) by mouth daily.  90 capsule  3  . spironolactone (ALDACTONE) 25 MG tablet Take 25 mg by mouth daily.          ROS Negative other than HPI.   PE General Appearance: well developed, well nourished in no acute distress HEENT: symmetrical face, PERRLA, good dentition  Neck: no JVD, thyromegaly, or adenopathy, trachea midline Chest: symmetric without deformity Cardiac: PMI non-displaced, RRR, normal S1, S2, no gallop or murmur Lung: clear to ausculation and percussion Vascular: all pulses full without bruits  Abdominal: nondistended, nontender, good bowel sounds, no HSM, no bruits Extremities: no cyanosis, clubbing or edema, no sign of DVT, no  varicosities  Skin: normal color, no rashes Neuro: alert and oriented x 3, non-focal Pysch: normal affect Filed Vitals:   09/12/11 1235  BP: 118/62  Pulse: 64  Height: 5\' 8"  (1.727 m)  Weight: 199 lb (90.266 kg)    EKG  Labs and Studies Reviewed.   Lab Results  Component Value Date   WBC 6.6 04/04/2011   HGB 11.9* 04/04/2011   HCT 34.5* 04/04/2011   MCV 94.1 04/04/2011   PLT 155.0 04/04/2011      Chemistry      Component Value Date/Time   NA 135 12/30/2010 1033   K 5.1 12/30/2010 1033   CL 101 12/30/2010 1033   CO2 28 12/30/2010 1033   BUN 24* 12/30/2010 1033   CREATININE 1.2 12/30/2010 1033      Component Value Date/Time   CALCIUM 9.0 12/30/2010 1033   ALKPHOS 58 12/30/2010 1033   AST 18 12/30/2010 1033   ALT 13 12/30/2010 1033   BILITOT 1.0 12/30/2010 1033       Lab Results  Component Value Date   CHOL 133 04/04/2011   CHOL 161 12/30/2010   CHOL 158 10/24/2010   Lab Results  Component Value Date   HDL 41.20 04/04/2011   HDL 42.30 12/30/2010   HDL 44.40 10/24/2010   Lab Results  Component Value Date   LDLCALC 76 04/04/2011   LDLCALC 92 12/30/2010   LDLCALC 88 10/24/2010   Lab Results  Component Value Date   TRIG 80.0 04/04/2011   TRIG 132.0 12/30/2010   TRIG 130.0 10/24/2010   Lab Results  Component Value Date   CHOLHDL 3 04/04/2011   CHOLHDL 4 12/30/2010   CHOLHDL 4 10/24/2010   Lab Results  Component Value Date   HGBA1C 6.7* 07/04/2011   Lab Results  Component Value Date   ALT 13 12/30/2010   AST 18 12/30/2010   ALKPHOS 58 12/30/2010   BILITOT 1.0 12/30/2010   Lab Results  Component Value Date   TSH 2.01 12/30/2010

## 2011-09-12 NOTE — Patient Instructions (Signed)
Your physician has requested that you have an echocardiogram. Echocardiography is a painless test that uses sound waves to create images of your heart. It provides your doctor with information about the size and shape of your heart and how well your heart's chambers and valves are working. This procedure takes approximately one hour. There are no restrictions for this procedure.  Your physician wants you to follow-up in: 6 months with Dr. Wall. You will receive a reminder letter in the mail two months in advance. If you don't receive a letter, please call our office to schedule the follow-up appointment.  

## 2011-09-13 LAB — BASIC METABOLIC PANEL
BUN: 18
Chloride: 103
Creatinine, Ser: 1.08

## 2011-09-13 LAB — CBC
HCT: 35.1 — ABNORMAL LOW
Hemoglobin: 12 — ABNORMAL LOW
WBC: 7.9

## 2011-09-13 LAB — CARDIAC PANEL(CRET KIN+CKTOT+MB+TROPI)
CK, MB: 1.4
Total CK: 80

## 2011-10-03 ENCOUNTER — Other Ambulatory Visit (INDEPENDENT_AMBULATORY_CARE_PROVIDER_SITE_OTHER): Payer: BC Managed Care – PPO

## 2011-10-03 DIAGNOSIS — E109 Type 1 diabetes mellitus without complications: Secondary | ICD-10-CM

## 2011-10-03 LAB — HEMOGLOBIN A1C: Hgb A1c MFr Bld: 6.7 % — ABNORMAL HIGH (ref 4.6–6.5)

## 2011-10-05 ENCOUNTER — Other Ambulatory Visit: Payer: Self-pay | Admitting: *Deleted

## 2011-10-05 ENCOUNTER — Ambulatory Visit (HOSPITAL_COMMUNITY): Payer: BC Managed Care – PPO | Attending: Internal Medicine | Admitting: Radiology

## 2011-10-05 DIAGNOSIS — I25810 Atherosclerosis of coronary artery bypass graft(s) without angina pectoris: Secondary | ICD-10-CM

## 2011-10-05 DIAGNOSIS — I059 Rheumatic mitral valve disease, unspecified: Secondary | ICD-10-CM

## 2011-10-05 DIAGNOSIS — I379 Nonrheumatic pulmonary valve disorder, unspecified: Secondary | ICD-10-CM | POA: Insufficient documentation

## 2011-10-05 DIAGNOSIS — I080 Rheumatic disorders of both mitral and aortic valves: Secondary | ICD-10-CM | POA: Insufficient documentation

## 2011-10-05 DIAGNOSIS — I079 Rheumatic tricuspid valve disease, unspecified: Secondary | ICD-10-CM | POA: Insufficient documentation

## 2011-10-05 DIAGNOSIS — I10 Essential (primary) hypertension: Secondary | ICD-10-CM | POA: Insufficient documentation

## 2011-10-05 MED ORDER — CARVEDILOL 12.5 MG PO TABS: 12.5000 mg | ORAL_TABLET | Freq: Two times a day (BID) | ORAL | Status: DC

## 2011-10-10 ENCOUNTER — Encounter: Payer: Self-pay | Admitting: Endocrinology

## 2011-10-10 ENCOUNTER — Ambulatory Visit (INDEPENDENT_AMBULATORY_CARE_PROVIDER_SITE_OTHER): Payer: BC Managed Care – PPO | Admitting: Endocrinology

## 2011-10-10 VITALS — BP 122/72 | HR 62 | Temp 98.0°F | Ht 70.0 in | Wt 204.4 lb

## 2011-10-10 DIAGNOSIS — D509 Iron deficiency anemia, unspecified: Secondary | ICD-10-CM

## 2011-10-10 DIAGNOSIS — Z23 Encounter for immunization: Secondary | ICD-10-CM

## 2011-10-10 DIAGNOSIS — E785 Hyperlipidemia, unspecified: Secondary | ICD-10-CM

## 2011-10-10 DIAGNOSIS — F329 Major depressive disorder, single episode, unspecified: Secondary | ICD-10-CM

## 2011-10-10 DIAGNOSIS — G609 Hereditary and idiopathic neuropathy, unspecified: Secondary | ICD-10-CM

## 2011-10-10 DIAGNOSIS — Z125 Encounter for screening for malignant neoplasm of prostate: Secondary | ICD-10-CM

## 2011-10-10 DIAGNOSIS — E109 Type 1 diabetes mellitus without complications: Secondary | ICD-10-CM

## 2011-10-10 DIAGNOSIS — F3289 Other specified depressive episodes: Secondary | ICD-10-CM

## 2011-10-10 DIAGNOSIS — Z79899 Other long term (current) drug therapy: Secondary | ICD-10-CM

## 2011-10-10 DIAGNOSIS — I10 Essential (primary) hypertension: Secondary | ICD-10-CM

## 2011-10-10 NOTE — Patient Instructions (Addendum)
Decrease humalog to 3x a day (just before each meal) 10-04-27 units.  However, if exercise is upcoming, take just 1/2 of your humalog. continue nph insulin, 5 units at bedtime. Please make a regular physical appointment in 3 months, with blood tests about 1 week prior. good diet and exercise habits significanly improve the control of your diabetes.  please let me know if you wish to be referred to a dietician.  high blood sugar is very risky to your health.  you should see an eye doctor every year. controlling your blood pressure and cholesterol drastically reduces the damage diabetes does to your body.  this also applies to quitting smoking.  please discuss these with your doctor.  you should take an aspirin every day, unless you have been advised by a doctor not to.

## 2011-10-10 NOTE — Progress Notes (Signed)
Subjective:    Patient ID: Billy Bush, male    DOB: [birth year], 70 y.o.   MRN: [id number]  HPI pt states he feels well in general, except he still has hypoglycemia 1/month, with exertion, when he is unable to anticipate it.  no cbg record, but states cbg's are well-controlled in general.  Past Medical History  Diagnosis Date  . Dyslipidemia   . Hypertension   . History of colonic polyps   . Thrombocytopenia   . Nonproliferative diabetic retinopathy NOS   . Impotence of organic origin   . Iron deficiency anemia, unspecified   . Special screening for malignant neoplasm of prostate   . Routine general medical examination at a health care facility   . Unspecified hereditary and idiopathic peripheral neuropathy   . Hyperlipidemia   . Diabetes mellitus   . Depressive disorder, not elsewhere classified   . Coronary artery disease   . CHF (congestive heart failure)   . Anxiety state, unspecified     Past Surgical History  Procedure Date  . Coronary artery bypass graft     X 3  . Cardiac catheterization 05/23/07  . Insert / replace / remove pacemaker 07/05/07    [employer]. JUDE SINGLE-CHAMBER DEFIBRILLATOR, Billy Bunting, MD    History   Social History  . Marital Status: Married    Spouse Name: N/A    Number of Children: 4  . Years of Education: N/A   Occupational History  . PASTOR    Social History Main Topics  . Smoking status: Former Smoker    Types: Cigarettes    Quit date: 11/27/1996  . Smokeless tobacco: Not on file  . Alcohol Use: No  . Drug Use: No  . Sexually Active:    Other Topics Concern  . Not on file   Social History Narrative   CHURCH PASTORMARRIED4 BOYSFORMER SMOKER, QUIT 14 YRSALCOHOL USE -NONO ILLICIT DRUG USEICD-St. JUDEPATIENT SIGNED A DESIGNATED PARTY RELEASE TO ALLOW HIS WIFE, Billy Bush, TO HAVE ACCESS TO HIS MEDICAL RECORDS/INFORMATION. Billy Bush, March 21, 2010 9:42 AM    Current Outpatient Prescriptions on File Prior to Visit  Medication  Sig Dispense Refill  . aspirin 325 MG EC tablet Take 325 mg by mouth daily.        . carvedilol (COREG) 12.5 MG tablet Take 1 tablet (12.5 mg total) by mouth 2 (two) times daily with a meal.  180 tablet  3  . co-enzyme Q-10 30 MG capsule Take 30 mg by mouth daily.        . furosemide (LASIX) 80 MG tablet Take 80 mg by mouth daily.        . Glucosamine HCl 1500 MG TABS Take 1 tablet by mouth daily.        . insulin NPH (HUMULIN N PEN) 100 UNIT/ML injection Inject 5 Units into the skin at bedtime.  10 mL  11  . Multiple Vitamin (MULTIVITAMIN) tablet Take 1 tablet by mouth daily.        Marland Kitchen NITROSTAT 0.4 MG SL tablet DISSOLVE 1 TABLET UNDER TOUNGUE AT ONSET OF CHEST PAIN, MAY REPEAT EVERY 5 MINUTES FOR UP TO 3 DOSES  25 tablet  1  . ONE TOUCH ULTRA TEST test strip TEST 2 TIMES A DAY  200 each  2  . PLAVIX 75 MG tablet TAKE 1 TABLET BY MOUTH ONCE DAILY  90 tablet  2  . ramipril (ALTACE) 5 MG capsule Take 1 capsule (5 mg total) by mouth daily.  90 capsule  3  . spironolactone (ALDACTONE) 25 MG tablet Take 25 mg by mouth daily.          No Known Allergies  No family history on file.  BP 122/72  Pulse 62  Temp(Src) 98 F (36.7 C) (Oral)  Ht 5\' 10"  (1.778 m)  Wt 204 lb 6 oz (92.704 kg)  BMI 29.32 kg/m2  SpO2 99%  Review of Systems Denies loc    Objective:   Physical Exam VITAL SIGNS:  See vs page GENERAL: no distress SKIN:  Insulin injection sites at the anterior abdomen are normal  Lab Results  Component Value Date   HGBA1C 6.7* 10/03/2011      Assessment & Plan:  DM, well-controlled

## 2011-10-12 ENCOUNTER — Other Ambulatory Visit: Payer: Self-pay | Admitting: Endocrinology

## 2011-10-13 DIAGNOSIS — Z79899 Other long term (current) drug therapy: Secondary | ICD-10-CM | POA: Insufficient documentation

## 2011-10-13 DIAGNOSIS — Z125 Encounter for screening for malignant neoplasm of prostate: Secondary | ICD-10-CM | POA: Insufficient documentation

## 2011-11-24 ENCOUNTER — Encounter: Payer: Self-pay | Admitting: Internal Medicine

## 2011-12-11 ENCOUNTER — Encounter: Payer: BC Managed Care – PPO | Admitting: *Deleted

## 2011-12-13 ENCOUNTER — Other Ambulatory Visit: Payer: Self-pay | Admitting: Cardiology

## 2011-12-14 ENCOUNTER — Encounter: Payer: Self-pay | Admitting: Internal Medicine

## 2011-12-14 ENCOUNTER — Ambulatory Visit (INDEPENDENT_AMBULATORY_CARE_PROVIDER_SITE_OTHER): Payer: BC Managed Care – PPO | Admitting: *Deleted

## 2011-12-14 DIAGNOSIS — I428 Other cardiomyopathies: Secondary | ICD-10-CM

## 2011-12-14 DIAGNOSIS — Z95810 Presence of automatic (implantable) cardiac defibrillator: Secondary | ICD-10-CM

## 2011-12-14 LAB — ICD DEVICE OBSERVATION
DEV-0020ICD: NEGATIVE
FVT: 0
HV IMPEDENCE: 42 Ohm
RV LEAD AMPLITUDE: 9.7 mv
RV LEAD IMPEDENCE ICD: 362.5 Ohm
RV LEAD THRESHOLD: 0.5 V
TOT-0008: 0
TOT-0009: 1
TZAT-0004SLOWVT: 8
TZAT-0013SLOWVT: 4
TZAT-0018FASTVT: NEGATIVE
TZAT-0019FASTVT: 7.5 V
TZON-0003FASTVT: 300 ms
TZON-0004FASTVT: 16
TZON-0005FASTVT: 6
TZON-0010FASTVT: 80 ms
TZST-0001FASTVT: 2
TZST-0001FASTVT: 4
TZST-0001FASTVT: 5
TZST-0001SLOWVT: 2
TZST-0001SLOWVT: 3
TZST-0001SLOWVT: 5
TZST-0003FASTVT: 36 J
TZST-0003SLOWVT: 36 J
TZST-0003SLOWVT: 36 J

## 2011-12-14 NOTE — Progress Notes (Signed)
icd check in clinic  

## 2012-01-02 ENCOUNTER — Other Ambulatory Visit (INDEPENDENT_AMBULATORY_CARE_PROVIDER_SITE_OTHER): Payer: BC Managed Care – PPO

## 2012-01-02 DIAGNOSIS — E109 Type 1 diabetes mellitus without complications: Secondary | ICD-10-CM

## 2012-01-02 DIAGNOSIS — E785 Hyperlipidemia, unspecified: Secondary | ICD-10-CM

## 2012-01-02 DIAGNOSIS — Z125 Encounter for screening for malignant neoplasm of prostate: Secondary | ICD-10-CM

## 2012-01-02 DIAGNOSIS — Z79899 Other long term (current) drug therapy: Secondary | ICD-10-CM

## 2012-01-02 DIAGNOSIS — G609 Hereditary and idiopathic neuropathy, unspecified: Secondary | ICD-10-CM

## 2012-01-02 DIAGNOSIS — I10 Essential (primary) hypertension: Secondary | ICD-10-CM

## 2012-01-02 DIAGNOSIS — D509 Iron deficiency anemia, unspecified: Secondary | ICD-10-CM

## 2012-01-02 DIAGNOSIS — F329 Major depressive disorder, single episode, unspecified: Secondary | ICD-10-CM

## 2012-01-02 LAB — CBC WITH DIFFERENTIAL/PLATELET
Basophils Absolute: 0 10*3/uL (ref 0.0–0.1)
Eosinophils Absolute: 0.1 10*3/uL (ref 0.0–0.7)
HCT: 32.6 % — ABNORMAL LOW (ref 39.0–52.0)
Hemoglobin: 11.2 g/dL — ABNORMAL LOW (ref 13.0–17.0)
Lymphs Abs: 1 10*3/uL (ref 0.7–4.0)
MCHC: 34.3 g/dL (ref 30.0–36.0)
MCV: 93.8 fl (ref 78.0–100.0)
Neutro Abs: 4.5 10*3/uL (ref 1.4–7.7)
RDW: 13.9 % (ref 11.5–14.6)

## 2012-01-02 LAB — BASIC METABOLIC PANEL
CO2: 27 mEq/L (ref 19–32)
Glucose, Bld: 169 mg/dL — ABNORMAL HIGH (ref 70–99)
Potassium: 4.6 mEq/L (ref 3.5–5.1)
Sodium: 138 mEq/L (ref 135–145)

## 2012-01-02 LAB — PSA: PSA: 3.13 ng/mL (ref 0.10–4.00)

## 2012-01-02 LAB — VITAMIN B12: Vitamin B-12: 292 pg/mL (ref 211–911)

## 2012-01-02 LAB — HEPATIC FUNCTION PANEL
AST: 17 U/L (ref 0–37)
Albumin: 4.1 g/dL (ref 3.5–5.2)
Alkaline Phosphatase: 66 U/L (ref 39–117)

## 2012-01-02 LAB — URINALYSIS, ROUTINE W REFLEX MICROSCOPIC
Bilirubin Urine: NEGATIVE
Hgb urine dipstick: NEGATIVE
Leukocytes, UA: NEGATIVE
Nitrite: NEGATIVE
pH: 6 (ref 5.0–8.0)

## 2012-01-02 LAB — MICROALBUMIN / CREATININE URINE RATIO: Microalb, Ur: 0.4 mg/dL (ref 0.0–1.9)

## 2012-01-02 LAB — LIPID PANEL: VLDL: 19.2 mg/dL (ref 0.0–40.0)

## 2012-01-09 ENCOUNTER — Encounter: Payer: Self-pay | Admitting: Endocrinology

## 2012-01-09 ENCOUNTER — Ambulatory Visit (INDEPENDENT_AMBULATORY_CARE_PROVIDER_SITE_OTHER): Payer: BC Managed Care – PPO | Admitting: Endocrinology

## 2012-01-09 VITALS — BP 114/70 | HR 60 | Temp 97.0°F | Ht 67.0 in | Wt 205.1 lb

## 2012-01-09 DIAGNOSIS — Z0000 Encounter for general adult medical examination without abnormal findings: Secondary | ICD-10-CM

## 2012-01-09 DIAGNOSIS — D509 Iron deficiency anemia, unspecified: Secondary | ICD-10-CM

## 2012-01-09 DIAGNOSIS — Z23 Encounter for immunization: Secondary | ICD-10-CM

## 2012-01-09 MED ORDER — GLUCOSE BLOOD VI STRP: ORAL_STRIP | Status: DC

## 2012-01-09 MED ORDER — INSULIN LISPRO 100 UNIT/ML ~~LOC~~ SOLN: SUBCUTANEOUS | Status: DC

## 2012-01-09 NOTE — Progress Notes (Signed)
Subjective:    Patient ID: Billy Bush, male    DOB: 1941/05/07, 71 y.o.   MRN: [id number]  HPI here for regular wellness examination.  He's feeling pretty well in general, and says chronic med probs are stable, except as noted below  Cardiol: [facility] Opthal: [facility]  Past Medical History  Diagnosis Date  . Dyslipidemia   . Hypertension   . History of colonic polyps   . Thrombocytopenia   . Nonproliferative diabetic retinopathy NOS   . Impotence of organic origin   . Iron deficiency anemia, unspecified   . Special screening for malignant neoplasm of prostate   . Routine general medical examination at a health care facility   . Unspecified hereditary and idiopathic peripheral neuropathy   . Hyperlipidemia   . Diabetes mellitus   . Depressive disorder, not elsewhere classified   . Coronary artery disease   . CHF (congestive heart failure)   . Anxiety state, unspecified     Past Surgical History  Procedure Date  . Coronary artery bypass graft     X 3  . Cardiac catheterization 05/23/07  . Insert / replace / remove pacemaker 07/05/07    [employer]. JUDE SINGLE-CHAMBER DEFIBRILLATOR, [name], MD    History   Social History  . Marital Status: Married    Spouse Name: N/A    Number of Children: 4  . Years of Education: N/A   Occupational History  . PASTOR    Social History Main Topics  . Smoking status: Former Smoker    Types: Cigarettes    Quit date: 11/27/1996  . Smokeless tobacco: Not on file  . Alcohol Use: No  . Drug Use: No  . Sexually Active:    Other Topics Concern  . Not on file   Social History Narrative   CHURCH PASTORMARRIED4 BOYSFORMER SMOKER, QUIT 14 YRSALCOHOL USE -NONO ILLICIT DRUG USEICD-St. JUDEPATIENT SIGNED A DESIGNATED PARTY RELEASE TO ALLOW HIS WIFE, GLENDA Plaza, TO HAVE ACCESS TO HIS MEDICAL RECORDS/INFORMATION. [name], March 21, 2010 9:42 AM    Current Outpatient Prescriptions on File Prior to Visit  Medication Sig Dispense  Refill  . aspirin 325 MG EC tablet Take 325 mg by mouth daily.        . carvedilol (COREG) 12.5 MG tablet Take 1 tablet (12.5 mg total) by mouth 2 (two) times daily with a meal.  180 tablet  3  . co-enzyme Q-10 30 MG capsule Take 30 mg by mouth daily.        . furosemide (LASIX) 80 MG tablet TAKE 1 TABLET BY MOUTH EVERY DAY  90 tablet  2  . Glucosamine HCl 1500 MG TABS Take 1 tablet by mouth daily.        . insulin lispro (HUMALOG KWIKPEN) 100 UNIT/ML injection        . insulin NPH (HUMULIN N PEN) 100 UNIT/ML injection Inject 5 Units into the skin at bedtime.  10 mL  11  . Multiple Vitamin (MULTIVITAMIN) tablet Take 1 tablet by mouth daily.        Marland Kitchen NITROSTAT 0.4 MG SL tablet DISSOLVE 1 TABLET UNDER TOUNGUE AT ONSET OF CHEST PAIN, MAY REPEAT EVERY 5 MINUTES FOR UP TO 3 DOSES  25 tablet  1  . ONE TOUCH ULTRA TEST test strip TEST 2 TIMES A DAY  200 each  2  . PLAVIX 75 MG tablet TAKE 1 TABLET BY MOUTH ONCE DAILY  90 tablet  2  . ramipril (ALTACE) 5 MG capsule  TAKE 1 CAPSULE BY MOUTH DAILY  90 capsule  2  . spironolactone (ALDACTONE) 25 MG tablet TAKE 1 TABLET BY MOUTH EVERY DAY  90 tablet  2  . atorvastatin (LIPITOR) 80 MG tablet TAKE 1 TABLET AT BEDTIME  90 tablet  3    No Known Allergies  No family history on file.  BP 114/70  Pulse 60  Temp(Src) 97 F (36.1 C) (Oral)  Ht 5\' 7"  (1.702 m)  Wt 205 lb 2 oz (93.044 kg)  BMI 32.13 kg/m2  SpO2 99%     Review of Systems  Constitutional: Negative for fever and unexpected weight change.  HENT: Negative for hearing loss.   Eyes: Negative for visual disturbance.  Respiratory: Negative for shortness of breath.   Cardiovascular: Negative for chest pain.  Gastrointestinal: Negative for anal bleeding.  Genitourinary: Negative for hematuria and difficulty urinating.  Musculoskeletal: Negative for gait problem.  Skin: Negative for rash.  Neurological: Negative for syncope and numbness.  Hematological: Bruises/bleeds easily.    Psychiatric/Behavioral: Negative for dysphoric mood.  denies hypoglycemia     Objective:   Physical Exam VS: see vs page GEN: no distress HEAD: head: no deformity eyes: no periorbital swelling, no proptosis external nose and ears are normal mouth: no lesion seen NECK: supple, thyroid is not enlarged CHEST WALL: no deformity LUNGS: clear to auscultation BREASTS:  No gynecomastia CV: reg rate and rhythm, no murmur ABD: abdomen is soft, nontender.  no hepatosplenomegaly.  not distended.  no hernia RECTAL: normal external and internal exam.  heme neg. PROSTATE:  Normal size.  No nodule MUSCULOSKELETAL: muscle bulk and strength are grossly normal.  no obvious joint swelling.  gait is normal and steady no deformity.  EXT: no ulcer on the feet. feet are of normal temp. 1+ bilat leg edema  there are bilat varicosities, and rust-colored patches. there is a vein harvest scar at the left leg.  PULSES: dorsalis pedis intact bilat.  no carotid bruit NEURO:  cn 2-12 grossly intact.   readily moves all 4's.  sensation is intact to touch on the feet SKIN:  Normal texture and temperature.  No rash or suspicious lesion is visible.  Few ecchymoses on forearms and hands NODES:  None palpable at the neck PSYCH: alert, oriented x3.  Does not appear anxious nor depressed.       Assessment & Plan:  Wellness visit today, with problems stable, except as noted.

## 2012-01-09 NOTE — Patient Instructions (Addendum)
please consider these measures for your health:  minimize alcohol.  do not use tobacco products.  have a colonoscopy at least every 10 years from age 71.  keep firearms safely stored.  always use seat belts.  have working smoke alarms in your home.  see an eye doctor and dentist regularly.  never drive under the influence of alcohol or drugs (including prescription drugs).  those with fair skin should take precautions against the sun. please let me know what your wishes would be, if artificial life support measures should become necessary.  it is critically important to prevent falling down (keep floor areas well-lit, dry, and free of loose objects.  If you have a cane, walker, or wheelchair, you should use it, even for short trips around the house.  Also, try not to rush) Please come back for a follow-up appointment in 3 months.   (update: we discussed code status.  pt requests full code, but would not want to be started or maintained on artificial life-support measures if there was not a reasonable chance of recovery).

## 2012-02-15 ENCOUNTER — Other Ambulatory Visit: Payer: Self-pay | Admitting: *Deleted

## 2012-02-15 MED ORDER — ONETOUCH ULTRASOFT LANCETS MISC: Status: AC

## 2012-02-15 MED ORDER — INSULIN PEN NEEDLE 32G X 4 MM MISC: Status: AC

## 2012-02-15 NOTE — Telephone Encounter (Signed)
R'cd fax from [facility] for rx for Pen needles and Lancets.

## 2012-02-16 ENCOUNTER — Telehealth: Payer: Self-pay | Admitting: Endocrinology

## 2012-02-16 MED ORDER — GLUCOSE BLOOD VI STRP: ORAL_STRIP | Status: DC

## 2012-02-16 MED ORDER — INSULIN LISPRO 100 UNIT/ML ~~LOC~~ SOLN: SUBCUTANEOUS | Status: DC

## 2012-02-16 NOTE — Telephone Encounter (Signed)
Pt's wife needs rx for Humalog and test strips sent electronically to [facility]. [facility] lost the rx that pt had mailed in previously for test strips and lancets. Rx sent.

## 2012-02-16 NOTE — Telephone Encounter (Signed)
Wife says humalog and test strips were lost by [facility]----wife ph #  Work:  [phone]--

## 2012-03-14 ENCOUNTER — Other Ambulatory Visit: Payer: Self-pay | Admitting: Cardiology

## 2012-04-03 ENCOUNTER — Other Ambulatory Visit: Payer: Self-pay | Admitting: *Deleted

## 2012-04-03 DIAGNOSIS — E109 Type 1 diabetes mellitus without complications: Secondary | ICD-10-CM

## 2012-04-04 ENCOUNTER — Ambulatory Visit (INDEPENDENT_AMBULATORY_CARE_PROVIDER_SITE_OTHER): Payer: BC Managed Care – PPO | Admitting: *Deleted

## 2012-04-04 ENCOUNTER — Other Ambulatory Visit (INDEPENDENT_AMBULATORY_CARE_PROVIDER_SITE_OTHER): Payer: BC Managed Care – PPO

## 2012-04-04 ENCOUNTER — Encounter: Payer: Self-pay | Admitting: Internal Medicine

## 2012-04-04 DIAGNOSIS — I428 Other cardiomyopathies: Secondary | ICD-10-CM

## 2012-04-04 DIAGNOSIS — E109 Type 1 diabetes mellitus without complications: Secondary | ICD-10-CM

## 2012-04-04 LAB — ICD DEVICE OBSERVATION
BATTERY VOLTAGE: 2.9328 V
BRDY-0002RV: 40 {beats}/min
DEV-0020ICD: NEGATIVE
RV LEAD AMPLITUDE: 10.3 mv
RV LEAD IMPEDENCE ICD: 362.5 Ohm
TOT-0010: 21
TZAT-0001FASTVT: 1
TZAT-0013FASTVT: 2
TZAT-0018SLOWVT: NEGATIVE
TZAT-0019SLOWVT: 7.5 V
TZAT-0020FASTVT: 1 ms
TZAT-0020SLOWVT: 1 ms
TZON-0003SLOWVT: 340 ms
TZON-0005FASTVT: 6
TZON-0010FASTVT: 80 ms
TZON-0010SLOWVT: 80 ms
TZST-0001FASTVT: 2
TZST-0001SLOWVT: 2
TZST-0001SLOWVT: 4
TZST-0003FASTVT: 36 J
TZST-0003FASTVT: 36 J
TZST-0003FASTVT: 36 J
TZST-0003SLOWVT: 36 J
VF: 0

## 2012-04-04 NOTE — Progress Notes (Signed)
ICD check 

## 2012-04-05 ENCOUNTER — Telehealth: Payer: Self-pay | Admitting: *Deleted

## 2012-04-05 ENCOUNTER — Ambulatory Visit (INDEPENDENT_AMBULATORY_CARE_PROVIDER_SITE_OTHER): Payer: BC Managed Care – PPO | Admitting: Cardiology

## 2012-04-05 ENCOUNTER — Encounter: Payer: Self-pay | Admitting: Cardiology

## 2012-04-05 VITALS — BP 110/58 | HR 54 | Ht 67.0 in | Wt 194.0 lb

## 2012-04-05 DIAGNOSIS — Z95810 Presence of automatic (implantable) cardiac defibrillator: Secondary | ICD-10-CM

## 2012-04-05 DIAGNOSIS — I080 Rheumatic disorders of both mitral and aortic valves: Secondary | ICD-10-CM

## 2012-04-05 DIAGNOSIS — I5022 Chronic systolic (congestive) heart failure: Secondary | ICD-10-CM

## 2012-04-05 DIAGNOSIS — I10 Essential (primary) hypertension: Secondary | ICD-10-CM

## 2012-04-05 DIAGNOSIS — I25810 Atherosclerosis of coronary artery bypass graft(s) without angina pectoris: Secondary | ICD-10-CM

## 2012-04-05 DIAGNOSIS — I2510 Atherosclerotic heart disease of native coronary artery without angina pectoris: Secondary | ICD-10-CM

## 2012-04-05 DIAGNOSIS — E785 Hyperlipidemia, unspecified: Secondary | ICD-10-CM

## 2012-04-05 NOTE — Telephone Encounter (Signed)
LMOVM to follow-up in 6 months not 1 year. Mylo Red RN

## 2012-04-05 NOTE — Patient Instructions (Signed)
Your physician recommends that you continue on your current medications as directed. Please refer to the Current Medication list given to you today.  Your physician wants you to follow-up in: 1 year. You will receive a reminder letter in the mail two months in advance. If you don't receive a letter, please call our office to schedule the follow-up appointment.  

## 2012-04-05 NOTE — Assessment & Plan Note (Signed)
Stable. Continue current medical therapy. We'll see back in the office in 6 months.

## 2012-04-05 NOTE — Progress Notes (Signed)
HPI Billy Bush returns today for his complex cardiovascular issues. He is doing remarkably well largely due to his compliance. He has actually lost weight with a diet. He does not exercise much because of increased work load. Labs were stable at goal in February. He is followed in device clinic by Dr. Ladona Ridgel.  Past Medical History  Diagnosis Date  . Dyslipidemia   . Hypertension   . History of colonic polyps   . Thrombocytopenia   . Nonproliferative diabetic retinopathy NOS   . Impotence of organic origin   . Iron deficiency anemia, unspecified   . Special screening for malignant neoplasm of prostate   . Routine general medical examination at a health care facility   . Unspecified hereditary and idiopathic peripheral neuropathy   . Hyperlipidemia   . Diabetes mellitus   . Depressive disorder, not elsewhere classified   . Coronary artery disease   . CHF (congestive heart failure)   . Anxiety state, unspecified     Current Outpatient Prescriptions  Medication Sig Dispense Refill  . aspirin 325 MG EC tablet Take 325 mg by mouth daily.        Marland Kitchen atorvastatin (LIPITOR) 80 MG tablet TAKE 1 TABLET AT BEDTIME  90 tablet  3  . carvedilol (COREG) 12.5 MG tablet Take 1 tablet (12.5 mg total) by mouth 2 (two) times daily with a meal.  180 tablet  3  . CHONDROITIN SULFATE A PO Take 1,200 mg by mouth.      . clopidogrel (PLAVIX) 75 MG tablet TAKE 1 TABLET BY MOUTH ONCE DAILY  90 tablet  2  . co-enzyme Q-10 30 MG capsule Take 30 mg by mouth daily.        . furosemide (LASIX) 80 MG tablet TAKE 1 TABLET BY MOUTH EVERY DAY  90 tablet  2  . Glucosamine HCl 1500 MG TABS Take 1 tablet by mouth daily.        Marland Kitchen glucose blood (ONE TOUCH ULTRA TEST) test strip Use as directed twice daily dx 250.01  200 each  3  . insulin lispro (HUMALOG KWIKPEN) 100 UNIT/ML injection Inject into the skin 3x a day (just before each meal) 10-04-27 units  60 mL  3  . Insulin Pen Needle (BD PEN NEEDLE NANO U/F) 32G X 4 MM MISC  Use as directed four times daily  400 each  3  . Lancets (ONETOUCH ULTRASOFT) lancets Use as instructed twice daily dx 250.01  200 each  3  . Multiple Vitamin (MULTIVITAMIN) tablet Take 1 tablet by mouth daily.        Marland Kitchen NITROSTAT 0.4 MG SL tablet DISSOLVE 1 TABLET UNDER TOUNGUE AT ONSET OF CHEST PAIN, MAY REPEAT EVERY 5 MINUTES FOR UP TO 3 DOSES  25 tablet  1  . ramipril (ALTACE) 5 MG capsule TAKE 1 CAPSULE BY MOUTH DAILY  90 capsule  2  . spironolactone (ALDACTONE) 25 MG tablet TAKE 1 TABLET BY MOUTH EVERY DAY  90 tablet  2    No Known Allergies  No family history on file.  History   Social History  . Marital Status: Married    Spouse Name: N/A    Number of Children: 4  . Years of Education: N/A   Occupational History  . PASTOR    Social History Main Topics  . Smoking status: Former Smoker    Types: Cigarettes    Quit date: 11/27/1996  . Smokeless tobacco: Not on file  . Alcohol Use: No  .  Drug Use: No  . Sexually Active:    Other Topics Concern  . Not on file   Social History Narrative   [employer] PASTORMARRIED4 BOYSFORMER SMOKER, QUIT 14 YRSALCOHOL USE -NONO ILLICIT DRUG USEICD-St. JUDEPATIENT SIGNED A DESIGNATED PARTY RELEASE TO ALLOW HIS WIFE, GLENDA Bush, TO HAVE ACCESS TO HIS MEDICAL RECORDS/INFORMATION. [name], March 21, 2010 9:42 AM    ROS ALL NEGATIVE EXCEPT THOSE NOTED IN HPI  PE  General Appearance: well developed, well nourished in no acute distress HEENT: symmetrical face, PERRLA, good dentition  Neck: no JVD, thyromegaly, or adenopathy, trachea midline Chest: symmetric without deformity Cardiac: PMI non-displaced, RRR, normal S1, S2, no gallop or murmur Lung: clear to ausculation and percussion Vascular: all pulses full without bruits  Abdominal: nondistended, nontender, good bowel sounds, no HSM, no bruits Extremities: no cyanosis, clubbing or edema, no sign of DVT, no varicosities  Skin: normal color, no rashes Neuro: alert and oriented x 3,  non-focal Pysch: normal affect  EKG Sinus bradycardia, incomplete left bundle branch block. BMET    Component Value Date/Time   NA 138 01/02/2012 0904   K 4.6 01/02/2012 0904   CL 105 01/02/2012 0904   CO2 27 01/02/2012 0904   GLUCOSE 169* 01/02/2012 0904   GLUCOSE 186* 10/29/2006 0755   BUN 28* 01/02/2012 0904   CREATININE 1.2 01/02/2012 0904   CALCIUM 9.1 01/02/2012 0904   GFRNONAA 71.13 12/14/2009 0850   GFRAA 96 12/07/2008 1005    Lipid Panel     Component Value Date/Time   CHOL 141 01/02/2012 0904   TRIG 96.0 01/02/2012 0904   HDL 41.50 01/02/2012 0904   CHOLHDL 3 01/02/2012 0904   VLDL 19.2 01/02/2012 0904   LDLCALC 80 01/02/2012 0904    CBC    Component Value Date/Time   WBC 6.1 01/02/2012 0904   RBC 3.48* 01/02/2012 0904   HGB 11.2* 01/02/2012 0904   HCT 32.6* 01/02/2012 0904   PLT 134.0* 01/02/2012 0904   MCV 93.8 01/02/2012 0904   MCHC 34.3 01/02/2012 0904   RDW 13.9 01/02/2012 0904   LYMPHSABS 1.0 01/02/2012 0904   MONOABS 0.5 01/02/2012 0904   EOSABS 0.1 01/02/2012 0904   BASOSABS 0.0 01/02/2012 5784

## 2012-04-09 ENCOUNTER — Ambulatory Visit (INDEPENDENT_AMBULATORY_CARE_PROVIDER_SITE_OTHER): Payer: BC Managed Care – PPO | Admitting: Endocrinology

## 2012-04-09 ENCOUNTER — Encounter: Payer: Self-pay | Admitting: Endocrinology

## 2012-04-09 VITALS — BP 112/78 | HR 58 | Temp 97.1°F | Ht 67.0 in | Wt 196.0 lb

## 2012-04-09 DIAGNOSIS — E109 Type 1 diabetes mellitus without complications: Secondary | ICD-10-CM

## 2012-04-09 MED ORDER — CITALOPRAM HYDROBROMIDE 10 MG PO TABS: 10.0000 mg | ORAL_TABLET | Freq: Every day | ORAL | Status: DC

## 2012-04-09 NOTE — Patient Instructions (Addendum)
Please continue the same insulin. i have sent a prescription to your pharmacy, for a pill against depression. Please come back for a follow-up appointment in 4 months.

## 2012-04-09 NOTE — Progress Notes (Signed)
Subjective:    Patient ID: Billy Bush, male    DOB: [birth year], 71 y.o.   MRN: [id number]  HPI Pt returns for f/u of IDDM (dx'ed, complicated by CAD, peripheral sensory neuropathy and retinopathy).  no cbg record, but states cbg's are well-controlled.  There is no trend throughout the day.  pt states he feels well in general.   Pt says his mild chronic depression has recurred, precip by "all that is going on."  He has assoc anxiety. Past Medical History  Diagnosis Date  . Dyslipidemia   . Hypertension   . History of colonic polyps   . Thrombocytopenia   . Nonproliferative diabetic retinopathy NOS   . Impotence of organic origin   . Iron deficiency anemia, unspecified   . Special screening for malignant neoplasm of prostate   . Routine general medical examination at a health care facility   . Unspecified hereditary and idiopathic peripheral neuropathy   . Hyperlipidemia   . Diabetes mellitus   . Depressive disorder, not elsewhere classified   . Coronary artery disease   . CHF (congestive heart failure)   . Anxiety state, unspecified     Past Surgical History  Procedure Date  . Coronary artery bypass graft     X 3  . Cardiac catheterization 05/23/07  . Insert / replace / remove pacemaker 07/05/07    [employer]. JUDE SINGLE-CHAMBER DEFIBRILLATOR, [name], MD    History   Social History  . Marital Status: Married    Spouse Name: N/A    Number of Children: 4  . Years of Education: N/A   Occupational History  . PASTOR    Social History Main Topics  . Smoking status: Former Smoker    Types: Cigarettes    Quit date: 11/27/1996  . Smokeless tobacco: Not on file  . Alcohol Use: No  . Drug Use: No  . Sexually Active:    Other Topics Concern  . Not on file   Social History Narrative   CHURCH PASTORMARRIED4 BOYSFORMER SMOKER, QUIT 14 YRSALCOHOL USE -NONO ILLICIT DRUG USEICD-St. JUDEPATIENT SIGNED A DESIGNATED PARTY RELEASE TO ALLOW HIS WIFE, GLENDA Fildes, TO HAVE ACCESS  TO HIS MEDICAL RECORDS/INFORMATION. [name], March 21, 2010 9:42 AM    Current Outpatient Prescriptions on File Prior to Visit  Medication Sig Dispense Refill  . aspirin 325 MG EC tablet Take 325 mg by mouth daily.        Marland Kitchen atorvastatin (LIPITOR) 80 MG tablet TAKE 1 TABLET AT BEDTIME  90 tablet  3  . carvedilol (COREG) 12.5 MG tablet Take 1 tablet (12.5 mg total) by mouth 2 (two) times daily with a meal.  180 tablet  3  . CHONDROITIN SULFATE A PO Take 1,200 mg by mouth.      . clopidogrel (PLAVIX) 75 MG tablet TAKE 1 TABLET BY MOUTH ONCE DAILY  90 tablet  2  . co-enzyme Q-10 30 MG capsule Take 30 mg by mouth daily.        . furosemide (LASIX) 80 MG tablet TAKE 1 TABLET BY MOUTH EVERY DAY  90 tablet  2  . Glucosamine HCl 1500 MG TABS Take 1 tablet by mouth daily.        Marland Kitchen glucose blood (ONE TOUCH ULTRA TEST) test strip Use as directed twice daily dx 250.01  200 each  3  . insulin lispro (HUMALOG KWIKPEN) 100 UNIT/ML injection Inject into the skin 3x a day (just before each meal) 10-04-27 units  60 mL  3  . Insulin Pen Needle (BD PEN NEEDLE NANO U/F) 32G X 4 MM MISC Use as directed four times daily  400 each  3  . Lancets (ONETOUCH ULTRASOFT) lancets Use as instructed twice daily dx 250.01  200 each  3  . Multiple Vitamin (MULTIVITAMIN) tablet Take 1 tablet by mouth daily.        Marland Kitchen NITROSTAT 0.4 MG SL tablet DISSOLVE 1 TABLET UNDER TOUNGUE AT ONSET OF CHEST PAIN, MAY REPEAT EVERY 5 MINUTES FOR UP TO 3 DOSES  25 tablet  1  . ramipril (ALTACE) 5 MG capsule TAKE 1 CAPSULE BY MOUTH DAILY  90 capsule  2  . spironolactone (ALDACTONE) 25 MG tablet TAKE 1 TABLET BY MOUTH EVERY DAY  90 tablet  2    No Known Allergies  No family history on file.  BP 112/78  Pulse 58  Temp(Src) 97.1 F (36.2 C) (Oral)  Ht 5\' 7"  (1.702 m)  Wt 196 lb (88.905 kg)  BMI 30.70 kg/m2  SpO2 99%  Review of Systems denies hypoglycemia and insomnia    Objective:   Physical Exam VITAL SIGNS:  See vs  page GENERAL: no distress. SKIN:  Insulin injection sites at the anterior abdomen are normal.  Lab Results  Component Value Date   HGBA1C 6.6* 04/04/2012      Assessment & Plan:  IDDM.  well-controlled Depression, recurrent Anxiety, recurrent

## 2012-04-30 ENCOUNTER — Encounter: Payer: BC Managed Care – PPO | Admitting: Internal Medicine

## 2012-05-24 ENCOUNTER — Other Ambulatory Visit: Payer: Self-pay | Admitting: Cardiology

## 2012-05-24 MED ORDER — NITROGLYCERIN 0.4 MG SL SUBL: 0.4000 mg | SUBLINGUAL_TABLET | SUBLINGUAL | Status: AC | PRN

## 2012-05-24 NOTE — Telephone Encounter (Signed)
Fax Received. Refill Completed. Billy Bush (R.M.A)   

## 2012-07-05 ENCOUNTER — Encounter: Payer: Self-pay | Admitting: Internal Medicine

## 2012-07-05 ENCOUNTER — Ambulatory Visit (INDEPENDENT_AMBULATORY_CARE_PROVIDER_SITE_OTHER): Payer: BC Managed Care – PPO | Admitting: Internal Medicine

## 2012-07-05 ENCOUNTER — Telehealth: Payer: Self-pay | Admitting: Internal Medicine

## 2012-07-05 VITALS — BP 108/64 | HR 56 | Ht 67.0 in | Wt 190.0 lb

## 2012-07-05 DIAGNOSIS — I2510 Atherosclerotic heart disease of native coronary artery without angina pectoris: Secondary | ICD-10-CM

## 2012-07-05 DIAGNOSIS — I447 Left bundle-branch block, unspecified: Secondary | ICD-10-CM

## 2012-07-05 DIAGNOSIS — Z95810 Presence of automatic (implantable) cardiac defibrillator: Secondary | ICD-10-CM

## 2012-07-05 DIAGNOSIS — I5022 Chronic systolic (congestive) heart failure: Secondary | ICD-10-CM

## 2012-07-05 LAB — ICD DEVICE OBSERVATION
DEV-0020ICD: NEGATIVE
DEVICE MODEL ICD: 484050
FVT: 0
PACEART VT: 0
TOT-0008: 0
TOT-0009: 1
TZAT-0001FASTVT: 1
TZAT-0001SLOWVT: 1
TZAT-0004FASTVT: 8
TZAT-0004SLOWVT: 8
TZAT-0012FASTVT: 200 ms
TZAT-0012SLOWVT: 200 ms
TZAT-0013SLOWVT: 4
TZAT-0019FASTVT: 7.5 V
TZON-0003FASTVT: 300 ms
TZON-0004FASTVT: 16
TZON-0005SLOWVT: 6
TZST-0001FASTVT: 3
TZST-0001FASTVT: 4
TZST-0001FASTVT: 5
TZST-0001SLOWVT: 3
TZST-0001SLOWVT: 4
TZST-0001SLOWVT: 5
TZST-0003FASTVT: 36 J
TZST-0003FASTVT: 36 J
TZST-0003SLOWVT: 25 J
TZST-0003SLOWVT: 36 J
TZST-0003SLOWVT: 36 J
VENTRICULAR PACING ICD: 0.84 pct

## 2012-07-05 NOTE — Assessment & Plan Note (Signed)
Interrogation of his [employer] ICD demonstrates one episode of ventricular fibrillation, which terminated spontaneously, just prior to ICD shock. This occurred in the middle of night and the patient was unaware.

## 2012-07-05 NOTE — Patient Instructions (Addendum)
Your physician recommends that you schedule a follow-up appointment in: 3 months in the device clinic and 12 months with Dr Taylor  

## 2012-07-05 NOTE — Assessment & Plan Note (Signed)
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 He will continue his current medications and maintain a low-sodium diet.

## 2012-07-05 NOTE — Telephone Encounter (Signed)
She just wanted to make sure Dr Ladona Ridgel was aware he was taking low dose Celexa.  I told her it was listed on his medication list from today and we would follow up with him in Nov when he sees Dr Daleen Squibb

## 2012-07-05 NOTE — Telephone Encounter (Signed)
New msg Pt's wife called about meds he is taking. Please call

## 2012-07-05 NOTE — Progress Notes (Signed)
HPI Billy Bush returns today for followup. 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 He denies chest pain, shortness of breath, or syncope. He remains active as a Education officer, environmental. No Known Allergies   Current Outpatient Prescriptions  Medication Sig Dispense Refill  . aspirin 325 MG EC tablet Take 325 mg by mouth daily.        Marland Kitchen atorvastatin (LIPITOR) 80 MG tablet TAKE 1 TABLET AT BEDTIME  90 tablet  3  . carvedilol (COREG) 12.5 MG tablet Take 1 tablet (12.5 mg total) by mouth 2 (two) times daily with a meal.  180 tablet  3  . CHONDROITIN SULFATE A PO Take 1,200 mg by mouth.      . citalopram (CELEXA) 10 MG tablet Take 1 tablet (10 mg total) by mouth daily.  30 tablet  11  . clopidogrel (PLAVIX) 75 MG tablet TAKE 1 TABLET BY MOUTH ONCE DAILY  90 tablet  2  . co-enzyme Q-10 30 MG capsule Take 30 mg by mouth daily.        . furosemide (LASIX) 80 MG tablet TAKE 1 TABLET BY MOUTH EVERY DAY  90 tablet  2  . Glucosamine HCl 1500 MG TABS Take 1 tablet by mouth daily.        Marland Kitchen glucose blood (ONE TOUCH ULTRA TEST) test strip Use as directed twice daily dx 250.01  200 each  3  . insulin lispro (HUMALOG KWIKPEN) 100 UNIT/ML injection Inject into the skin 3x a day (just before each meal) 10-04-27 units  60 mL  3  . Insulin Pen Needle (BD PEN NEEDLE NANO U/F) 32G X 4 MM MISC Use as directed four times daily  400 each  3  . Lancets (ONETOUCH ULTRASOFT) lancets Use as instructed twice daily dx 250.01  200 each  3  . Multiple Vitamin (MULTIVITAMIN) tablet Take 1 tablet by mouth daily.        . nitroGLYCERIN (NITROSTAT) 0.4 MG SL tablet Place 1 tablet (0.4 mg total) under the tongue every 5 (five) minutes as needed for chest pain.  25 tablet  3  . ramipril (ALTACE) 5 MG capsule TAKE 1 CAPSULE BY MOUTH DAILY  90 capsule  2  . spironolactone (ALDACTONE) 25 MG tablet TAKE 1 TABLET BY MOUTH EVERY DAY  90 tablet  2      Past Medical History  Diagnosis Date  . Dyslipidemia   . Hypertension   . History of colonic polyps   . Thrombocytopenia   . Nonproliferative diabetic retinopathy NOS   . Impotence of organic origin   . Iron deficiency anemia, unspecified   . Special screening for malignant neoplasm of prostate   . Routine general medical examination at a health care facility   . Unspecified hereditary and idiopathic peripheral neuropathy   . Hyperlipidemia   . Diabetes mellitus   . Depressive disorder, not elsewhere classified   . Coronary artery disease   . CHF (congestive heart failure)   . Anxiety state, unspecified     ROS:   All systems reviewed and negative except as noted in the HPI.   Past Surgical History  Procedure Date  . Coronary artery bypass graft     X 3  . Cardiac catheterization 05/23/07  . Insert / replace / remove pacemaker 07/05/07    [employer]. JUDE SINGLE-CHAMBER DEFIBRILLATOR, Billy Bunting, MD     No family history on file.  History   Social History  . Marital Status: Married    Spouse Name: N/A    Number of Children: 4  . Years of Education: N/A   Occupational History  . PASTOR    Social History Main Topics  . Smoking status: Former Smoker    Types: Cigarettes    Quit date: 11/27/1996  . Smokeless tobacco: Never Used  . Alcohol Use: No  . Drug Use: No  . Sexually Active: Not on file   Other Topics Concern  . Not on file   Social History Narrative   CHURCH PASTORMARRIED4 BOYSFORMER SMOKER, QUIT 14 YRSALCOHOL USE -NONO ILLICIT DRUG USEICD-St. JUDEPATIENT SIGNED A DESIGNATED PARTY RELEASE TO ALLOW HIS WIFE, Billy Bush, TO HAVE ACCESS TO HIS MEDICAL RECORDS/INFORMATION. Billy Bush, March 21, 2010 9:42 AM     BP 108/64  Pulse 56  Ht 5\' 7"  (1.702 m)  Wt 190 lb (86.183 kg)  BMI 29.76 kg/m2  SpO2 98%  Physical Exam:  Well appearing 71 year old man, NAD HEENT: Unremarkable Neck:  No JVD, no thyromegally Lungs:  Clear with no wheezes,  rales, or rhonchi. HEART:  Regular rate rhythm, no murmurs, no rubs, no clicks Abd:  soft, positive bowel sounds, no organomegally, no rebound, no guarding Ext:  2 plus pulses, no edema, no cyanosis, no clubbing Skin:  No rashes no nodules Neuro:  CN II through XII intact, motor grossly intact  DEVICE  Normal device function.  See PaceArt for details.   Assess/Plan:

## 2012-07-05 NOTE — Assessment & Plan Note (Signed)
He denies anginal symptoms. He remains active exercising on a regular basis. He will continue his current medical therapy.

## 2012-07-31 ENCOUNTER — Telehealth: Payer: Self-pay | Admitting: Endocrinology

## 2012-07-31 MED ORDER — CITALOPRAM HYDROBROMIDE 10 MG PO TABS: 10.0000 mg | ORAL_TABLET | Freq: Every day | ORAL | Status: DC

## 2012-07-31 NOTE — Telephone Encounter (Signed)
Rx sent to [facility], pt's spouse informed.

## 2012-07-31 NOTE — Telephone Encounter (Signed)
Caller: Glenda/Spouse; Patient Name: Billy Bush; PCP: Romero Belling (Adults only); Best Callback Phone Number: [phone].  Call regarding:  has appointment on 08/13/12 to monitor diabetes and typically he comes in the week prior to check A1C; asks to confirm that this has been ordered and which day does he need to come for the labs.  Patient is on Citalopram 10 since may 2013, which has been being filled for 30 day at [facility]  on [address].  He seems to be doing well, asks if Rx can be sent to [facility] for 90 day supply instead.  She states she has requested this change through [facility] on 07/31/12. Last filled approximately 2 weeks ago and it will take that long to get the new one from [facility].  Note to office per Medication Questions protocol.   Last order for Citalopram copied from EMR:  Medication  citalopram (CELEXA) 10 MG tablet [30264] citalopram (CELEXA) 10 MG tablet [[id number]]   Order Details     Dose: 10 mg Route: Oral Frequency: Daily   Dispense Quantity:  30 tablet Refills:  11 Fills Remaining:  11         Sig: Take 1 tablet (10 mg total) by mouth daily.               Written Date:  04/09/12 Expiration Date:  04/09/13        Start Date:  04/09/12 End Date:  04/09/13        Prescribed by:  -- Authorized by:  Romero Belling, MD Ordering User:  Romero Belling, MD

## 2012-07-31 NOTE — Telephone Encounter (Signed)
Okay to send 90 day supply of Citalopram to mail order company? Please advise.

## 2012-07-31 NOTE — Telephone Encounter (Signed)
ok 

## 2012-08-06 ENCOUNTER — Other Ambulatory Visit: Payer: Self-pay | Admitting: Cardiology

## 2012-08-06 ENCOUNTER — Other Ambulatory Visit (INDEPENDENT_AMBULATORY_CARE_PROVIDER_SITE_OTHER): Payer: BC Managed Care – PPO

## 2012-08-06 DIAGNOSIS — E109 Type 1 diabetes mellitus without complications: Secondary | ICD-10-CM

## 2012-08-07 ENCOUNTER — Other Ambulatory Visit: Payer: Self-pay | Admitting: Cardiology

## 2012-08-08 ENCOUNTER — Other Ambulatory Visit: Payer: Self-pay | Admitting: Cardiology

## 2012-08-13 ENCOUNTER — Ambulatory Visit (INDEPENDENT_AMBULATORY_CARE_PROVIDER_SITE_OTHER): Payer: BC Managed Care – PPO | Admitting: Endocrinology

## 2012-08-13 ENCOUNTER — Encounter: Payer: Self-pay | Admitting: Endocrinology

## 2012-08-13 VITALS — BP 114/74 | HR 58 | Temp 97.1°F | Ht 68.0 in | Wt 194.0 lb

## 2012-08-13 DIAGNOSIS — E109 Type 1 diabetes mellitus without complications: Secondary | ICD-10-CM

## 2012-08-13 NOTE — Progress Notes (Signed)
Subjective:    Patient ID: Billy Bush, male    DOB: [birth year], 70 y.o.   MRN: [id number]  HPI Pt returns for f/u of IDDM (dx'ed 2002, complicated by CAD, peripheral sensory neuropathy and retinopathy).  no cbg record, but states cbg's are well-controlled.  There is no trend throughout the day. pt states he feels well in general.  Past Medical History  Diagnosis Date  . Dyslipidemia   . Hypertension   . History of colonic polyps   . Thrombocytopenia   . Nonproliferative diabetic retinopathy NOS   . Impotence of organic origin   . Iron deficiency anemia, unspecified   . Special screening for malignant neoplasm of prostate   . Routine general medical examination at a health care facility   . Unspecified hereditary and idiopathic peripheral neuropathy   . Hyperlipidemia   . Diabetes mellitus   . Depressive disorder, not elsewhere classified   . Coronary artery disease   . CHF (congestive heart failure)   . Anxiety state, unspecified     Past Surgical History  Procedure Date  . Coronary artery bypass graft     X 3  . Cardiac catheterization 05/23/07  . Insert / replace / remove pacemaker 07/05/07    [employer]. JUDE SINGLE-CHAMBER DEFIBRILLATOR, Billy Bunting, MD    History   Social History  . Marital Status: Married    Spouse Name: N/A    Number of Children: 4  . Years of Education: N/A   Occupational History  . PASTOR    Social History Main Topics  . Smoking status: Former Smoker    Types: Cigarettes    Quit date: 11/27/1996  . Smokeless tobacco: Never Used  . Alcohol Use: No  . Drug Use: No  . Sexually Active: Not on file   Other Topics Concern  . Not on file   Social History Narrative   CHURCH PASTORMARRIED4 BOYSFORMER SMOKER, QUIT 14 YRSALCOHOL USE -NONO ILLICIT DRUG USEICD-St. JUDEPATIENT SIGNED A DESIGNATED PARTY RELEASE TO ALLOW HIS WIFE, Billy Bush, TO HAVE ACCESS TO HIS MEDICAL RECORDS/INFORMATION. Billy Bush, March 21, 2010 9:42 AM    Current  Outpatient Prescriptions on File Prior to Visit  Medication Sig Dispense Refill  . aspirin 325 MG EC tablet Take 325 mg by mouth daily.        Marland Kitchen atorvastatin (LIPITOR) 80 MG tablet TAKE 1 TABLET AT BEDTIME  90 tablet  3  . carvedilol (COREG) 12.5 MG tablet TAKE 1 TABLET (12.5MG  TOTAL ) BY MOUTH 2 TIMES DAILY WITH A MEAL  180 tablet  2  . CHONDROITIN SULFATE A PO Take 1,200 mg by mouth.      . citalopram (CELEXA) 10 MG tablet Take 1 tablet (10 mg total) by mouth daily.  90 tablet  2  . clopidogrel (PLAVIX) 75 MG tablet TAKE 1 TABLET BY MOUTH ONCE DAILY  90 tablet  2  . co-enzyme Q-10 30 MG capsule Take 30 mg by mouth daily.        . furosemide (LASIX) 80 MG tablet TAKE 1 TABLET BY MOUTH EVERY DAY  90 tablet  2  . Glucosamine HCl 1500 MG TABS Take 1 tablet by mouth daily.        Marland Kitchen glucose blood (ONE TOUCH ULTRA TEST) test strip Use as directed twice daily dx 250.01  200 each  3  . Insulin Pen Needle (BD PEN NEEDLE NANO U/F) 32G X 4 MM MISC Use as directed four times daily  400 each  3  . Lancets (ONETOUCH ULTRASOFT) lancets Use as instructed twice daily dx 250.01  200 each  3  . Multiple Vitamin (MULTIVITAMIN) tablet Take 1 tablet by mouth daily.        . nitroGLYCERIN (NITROSTAT) 0.4 MG SL tablet Place 1 tablet (0.4 mg total) under the tongue every 5 (five) minutes as needed for chest pain.  25 tablet  3  . ramipril (ALTACE) 5 MG capsule TAKE 1 CAPSULE BY MOUTH DAILY  90 capsule  3  . spironolactone (ALDACTONE) 25 MG tablet TAKE 1 TABLET BY MOUTH EVERY DAY  90 tablet  2  . DISCONTD: insulin lispro (HUMALOG KWIKPEN) 100 UNIT/ML injection Inject into the skin 3x a day (just before each meal) 10-04-27 units  60 mL  3    No Known Allergies  No family history on file.  BP 114/74  Pulse 58  Temp 97.1 F (36.2 C) (Oral)  Ht 5\' 8"  (1.727 m)  Wt 194 lb (87.998 kg)  BMI 29.50 kg/m2  SpO2 99% Review of Systems denies hypoglycemia    Objective:   Physical Exam Pulses: dorsalis pedis intact  bilat.   Feet: no deformity.  no ulcer on the feet.  feet are of normal color and temp.  no edema Neuro: sensation is intact to touch on the feet  Lab Results  Component Value Date   HGBA1C 6.0 08/06/2012       Assessment & Plan:  DM is overcontrolled.

## 2012-08-13 NOTE — Patient Instructions (Addendum)
Decrease humalog to 3x a day (just before each meal) 09-01-25 units.  However, if exercise is upcoming, take just 1/2 of your humalog. Please come back for a regular physical after 01/08/13.  check your blood sugar twice a day.  vary the time of day when you check, between before the 3 meals, and at bedtime.  also check if you have symptoms of your blood sugar being too high or too low.  please keep a record of the readings and bring it to your next appointment here.  please call us sooner if your blood sugar goes below 70, or if you have a lot of readings over 200.

## 2012-08-22 ENCOUNTER — Telehealth: Payer: Self-pay | Admitting: *Deleted

## 2012-08-22 DIAGNOSIS — Z0389 Encounter for observation for other suspected diseases and conditions ruled out: Secondary | ICD-10-CM

## 2012-08-22 DIAGNOSIS — E109 Type 1 diabetes mellitus without complications: Secondary | ICD-10-CM

## 2012-08-22 DIAGNOSIS — D509 Iron deficiency anemia, unspecified: Secondary | ICD-10-CM

## 2012-08-22 DIAGNOSIS — Z0000 Encounter for general adult medical examination without abnormal findings: Secondary | ICD-10-CM

## 2012-08-22 NOTE — Telephone Encounter (Signed)
CPX labs placed into Epic for upcoming CPX appointment.  

## 2012-08-22 NOTE — Telephone Encounter (Signed)
Message copied by Carin Primrose on Thu Aug 22, 2012 10:09 AM ------      Message from: Burnett Harry      Created: Wed Aug 14, 2012  3:34 PM      Regarding: cpe 01/10/2013       Thanks, pt would also like for his A1C to be checked

## 2012-09-25 ENCOUNTER — Encounter: Payer: Self-pay | Admitting: *Deleted

## 2012-10-03 ENCOUNTER — Encounter: Payer: Self-pay | Admitting: Cardiology

## 2012-10-03 ENCOUNTER — Ambulatory Visit: Payer: BC Managed Care – PPO | Admitting: Cardiology

## 2012-10-03 ENCOUNTER — Ambulatory Visit (INDEPENDENT_AMBULATORY_CARE_PROVIDER_SITE_OTHER): Payer: BC Managed Care – PPO | Admitting: *Deleted

## 2012-10-03 ENCOUNTER — Encounter: Payer: Self-pay | Admitting: Internal Medicine

## 2012-10-03 ENCOUNTER — Ambulatory Visit (INDEPENDENT_AMBULATORY_CARE_PROVIDER_SITE_OTHER): Payer: BC Managed Care – PPO | Admitting: Cardiology

## 2012-10-03 VITALS — BP 116/64 | HR 68 | Ht 68.0 in | Wt 188.0 lb

## 2012-10-03 DIAGNOSIS — I5022 Chronic systolic (congestive) heart failure: Secondary | ICD-10-CM

## 2012-10-03 DIAGNOSIS — I447 Left bundle-branch block, unspecified: Secondary | ICD-10-CM

## 2012-10-03 DIAGNOSIS — I10 Essential (primary) hypertension: Secondary | ICD-10-CM

## 2012-10-03 DIAGNOSIS — I2510 Atherosclerotic heart disease of native coronary artery without angina pectoris: Secondary | ICD-10-CM

## 2012-10-03 DIAGNOSIS — I080 Rheumatic disorders of both mitral and aortic valves: Secondary | ICD-10-CM

## 2012-10-03 DIAGNOSIS — I25810 Atherosclerosis of coronary artery bypass graft(s) without angina pectoris: Secondary | ICD-10-CM

## 2012-10-03 LAB — ICD DEVICE OBSERVATION
BRDY-0002RV: 40 {beats}/min
DEVICE MODEL ICD: 484050
FVT: 0
TOT-0007: 2
TZAT-0001FASTVT: 1
TZAT-0001SLOWVT: 1
TZAT-0004SLOWVT: 8
TZAT-0012FASTVT: 200 ms
TZAT-0013FASTVT: 2
TZON-0003SLOWVT: 340 ms
TZON-0010SLOWVT: 80 ms
TZST-0001FASTVT: 2
TZST-0001FASTVT: 5
TZST-0001SLOWVT: 2
TZST-0001SLOWVT: 3
TZST-0001SLOWVT: 5
TZST-0003FASTVT: 36 J
TZST-0003FASTVT: 36 J
TZST-0003FASTVT: 36 J
TZST-0003FASTVT: 36 J
TZST-0003SLOWVT: 25 J
TZST-0003SLOWVT: 36 J
VENTRICULAR PACING ICD: 0.45 pct
VF: 0

## 2012-10-03 NOTE — Assessment & Plan Note (Signed)
Stable. Continue current medical therapy. Return the office in 6 months. Other problems stable as well.

## 2012-10-03 NOTE — Patient Instructions (Signed)
Your physician recommends that you continue on your current medications as directed. Please refer to the Current Medication list given to you today.  Your physician wants you to follow-up in: 6 months with Dr. Wall. You will receive a reminder letter in the mail two months in advance. If you don't receive a letter, please call our office to schedule the follow-up appointment.  

## 2012-10-03 NOTE — Progress Notes (Signed)
ICD check 

## 2012-10-03 NOTE — Patient Instructions (Addendum)
Return office visit 01/08/13 @9 :00am.

## 2012-10-03 NOTE — Progress Notes (Signed)
HPI Mr. Cisnero returns today for the valuation management his complex cardiovascular history. Since last visit he had a significant pulse but no shock. He is followed by Dr. Ladona Ridgel.  He denies orthopnea, PND, edema, palpitations, presyncope or syncope, or angina. Very compliant with medications. INR therapeutic today.  Past Medical History  Diagnosis Date  . Dyslipidemia   . Hypertension   . History of colonic polyps   . Thrombocytopenia   . Nonproliferative diabetic retinopathy NOS(362.03)   . Impotence of organic origin   . Iron deficiency anemia, unspecified   . Special screening for malignant neoplasm of prostate   . Routine general medical examination at a health care facility   . Unspecified hereditary and idiopathic peripheral neuropathy   . Hyperlipidemia   . Diabetes mellitus   . Depressive disorder, not elsewhere classified   . Coronary artery disease   . CHF (congestive heart failure)   . Anxiety state, unspecified     Current Outpatient Prescriptions  Medication Sig Dispense Refill  . aspirin 325 MG EC tablet Take 325 mg by mouth daily.        Marland Kitchen atorvastatin (LIPITOR) 80 MG tablet TAKE 1 TABLET AT BEDTIME  90 tablet  3  . carvedilol (COREG) 12.5 MG tablet TAKE 1 TABLET (12.5MG  TOTAL ) BY MOUTH 2 TIMES DAILY WITH A MEAL  180 tablet  2  . CHONDROITIN SULFATE A PO Take 1,200 mg by mouth.      . citalopram (CELEXA) 10 MG tablet Take 1 tablet (10 mg total) by mouth daily.  90 tablet  2  . clopidogrel (PLAVIX) 75 MG tablet TAKE 1 TABLET BY MOUTH ONCE DAILY  90 tablet  2  . co-enzyme Q-10 30 MG capsule Take 30 mg by mouth daily.        . furosemide (LASIX) 80 MG tablet TAKE 1 TABLET BY MOUTH EVERY DAY  90 tablet  2  . Glucosamine HCl 1500 MG TABS Take 1 tablet by mouth daily.        Marland Kitchen glucose blood (ONE TOUCH ULTRA TEST) test strip Use as directed twice daily dx 250.01  200 each  3  . insulin lispro (HUMALOG) 100 UNIT/ML injection Inject into the skin 3x a day (just before  each meal) 11-04-27 units      . Insulin Pen Needle (BD PEN NEEDLE NANO U/F) 32G X 4 MM MISC Use as directed four times daily  400 each  3  . Lancets (ONETOUCH ULTRASOFT) lancets Use as instructed twice daily dx 250.01  200 each  3  . Multiple Vitamin (MULTIVITAMIN) tablet Take 1 tablet by mouth daily.        . nitroGLYCERIN (NITROSTAT) 0.4 MG SL tablet Place 1 tablet (0.4 mg total) under the tongue every 5 (five) minutes as needed for chest pain.  25 tablet  3  . ramipril (ALTACE) 5 MG capsule TAKE 1 CAPSULE BY MOUTH DAILY  90 capsule  3  . spironolactone (ALDACTONE) 25 MG tablet TAKE 1 TABLET BY MOUTH EVERY DAY  90 tablet  2    No Known Allergies  No family history on file.  History   Social History  . Marital Status: Married    Spouse Name: N/A    Number of Children: 4  . Years of Education: N/A   Occupational History  . PASTOR    Social History Main Topics  . Smoking status: Former Smoker    Types: Cigarettes    Quit date: 11/27/1996  .  Smokeless tobacco: Never Used  . Alcohol Use: No  . Drug Use: No  . Sexually Active: Not on file   Other Topics Concern  . Not on file   Social History Narrative   CHURCH PASTORMARRIED4 BOYSFORMER SMOKER, QUIT 14 YRSALCOHOL USE -NONO ILLICIT DRUG USEICD-St. JUDEPATIENT SIGNED A DESIGNATED PARTY RELEASE TO ALLOW HIS WIFE, GLENDA Menees, TO HAVE ACCESS TO HIS MEDICAL RECORDS/INFORMATION. [name], March 21, 2010 9:42 AM    ROS ALL NEGATIVE EXCEPT THOSE NOTED IN HPI  PE  General Appearance: well developed, well nourished in no acute distress HEENT: symmetrical face, PERRLA, good dentition  Neck: no JVD, thyromegaly, or adenopathy, trachea midline Chest: symmetric without deformity Cardiac: PMI displaced, irregular rate and rhythm, normal S1, S2, no gallop or murmur Lung: clear to ausculation and percussion Vascular: all pulses full without bruits  Abdominal: nondistended, nontender, good bowel sounds, no HSM, no  bruits Extremities: no cyanosis, clubbing or edema, no sign of DVT, no varicosities  Skin: normal color, no rashes Neuro: alert and oriented x 3, non-focal Pysch: normal affect  EKG  BMET    Component Value Date/Time   NA 138 01/02/2012 0904   K 4.6 01/02/2012 0904   CL 105 01/02/2012 0904   CO2 27 01/02/2012 0904   GLUCOSE 169* 01/02/2012 0904   GLUCOSE 186* 10/29/2006 0755   BUN 28* 01/02/2012 0904   CREATININE 1.2 01/02/2012 0904   CALCIUM 9.1 01/02/2012 0904   GFRNONAA 71.13 12/14/2009 0850   GFRAA 96 12/07/2008 1005    Lipid Panel     Component Value Date/Time   CHOL 141 01/02/2012 0904   TRIG 96.0 01/02/2012 0904   HDL 41.50 01/02/2012 0904   CHOLHDL 3 01/02/2012 0904   VLDL 19.2 01/02/2012 0904   LDLCALC 80 01/02/2012 0904    CBC    Component Value Date/Time   WBC 6.1 01/02/2012 0904   RBC 3.48* 01/02/2012 0904   HGB 11.2* 01/02/2012 0904   HCT 32.6* 01/02/2012 0904   PLT 134.0* 01/02/2012 0904   MCV 93.8 01/02/2012 0904   MCHC 34.3 01/02/2012 0904   RDW 13.9 01/02/2012 0904   LYMPHSABS 1.0 01/02/2012 0904   MONOABS 0.5 01/02/2012 0904   EOSABS 0.1 01/02/2012 0904   BASOSABS 0.0 01/02/2012 0904

## 2012-10-13 ENCOUNTER — Other Ambulatory Visit: Payer: Self-pay | Admitting: Endocrinology

## 2012-10-14 ENCOUNTER — Other Ambulatory Visit: Payer: Self-pay | Admitting: *Deleted

## 2012-10-14 MED ORDER — SPIRONOLACTONE 25 MG PO TABS: 25.0000 mg | ORAL_TABLET | Freq: Every day | ORAL | Status: DC

## 2012-10-14 MED ORDER — FUROSEMIDE 80 MG PO TABS: 80.0000 mg | ORAL_TABLET | Freq: Every day | ORAL | Status: DC

## 2012-10-18 ENCOUNTER — Other Ambulatory Visit: Payer: Self-pay | Admitting: *Deleted

## 2012-10-18 MED ORDER — INSULIN LISPRO 100 UNIT/ML ~~LOC~~ SOLN: SUBCUTANEOUS | Status: DC

## 2012-10-18 NOTE — Telephone Encounter (Signed)
Ok, please refill prn 

## 2012-10-18 NOTE — Telephone Encounter (Signed)
Refill faxed to [facility] [address], for humalog insulin kwick pen so patient may use the voucher for 2 free boxes.

## 2012-10-18 NOTE — Telephone Encounter (Signed)
PATIENT WIFE CALLED  REQUEST Rx FOR HER HUSBAND HUMALOG KWIK PEN BE PRINTED AND FAXED TO THE LOCAL PHARMACY [facility] , [address]  SO THAT THEY MAY USE THE VOUCHER YOU GAVE THEM FOR 2 FREE BOXES . PLEASE ADVISE. SUE

## 2013-01-03 ENCOUNTER — Ambulatory Visit: Payer: Medicare Other

## 2013-01-03 LAB — MICROALBUMIN / CREATININE URINE RATIO: Microalb Creat Ratio: 0.5 mg/g (ref 0.0–30.0)

## 2013-01-03 LAB — URINALYSIS, ROUTINE W REFLEX MICROSCOPIC
Bilirubin Urine: NEGATIVE
Hgb urine dipstick: NEGATIVE
Ketones, ur: NEGATIVE
Leukocytes, UA: NEGATIVE
Nitrite: NEGATIVE
Specific Gravity, Urine: 1.015
Total Protein, Urine: NEGATIVE
Urine Glucose: NEGATIVE
Urobilinogen, UA: 0.2
pH: 6 (ref 5.0–8.0)

## 2013-01-03 LAB — IBC PANEL
Iron: 59 ug/dL (ref 42–165)
Saturation Ratios: 18.3 % — ABNORMAL LOW (ref 20.0–50.0)
Transferrin: 229.7 mg/dL (ref 212.0–360.0)

## 2013-01-03 LAB — CBC WITH DIFFERENTIAL/PLATELET
Eosinophils Relative: 2 % (ref 0.0–5.0)
Lymphocytes Relative: 16.8 % (ref 12.0–46.0)
MCV: 92.2 fl (ref 78.0–100.0)
Monocytes Absolute: 0.5 10*3/uL (ref 0.1–1.0)
Monocytes Relative: 7.3 % (ref 3.0–12.0)
Neutrophils Relative %: 73.5 % (ref 43.0–77.0)
Platelets: 157 10*3/uL (ref 150.0–400.0)
WBC: 6.4 10*3/uL (ref 4.5–10.5)

## 2013-01-03 LAB — HEPATIC FUNCTION PANEL
ALT: 14 U/L (ref 0–53)
AST: 19 U/L (ref 0–37)
Albumin: 4.2 g/dL (ref 3.5–5.2)
Alkaline Phosphatase: 61 U/L (ref 39–117)
Bilirubin, Direct: 0.2 mg/dL (ref 0.0–0.3)
Total Bilirubin: 1 mg/dL (ref 0.3–1.2)
Total Protein: 6.3 g/dL (ref 6.0–8.3)

## 2013-01-03 LAB — BASIC METABOLIC PANEL
BUN: 23 mg/dL (ref 6–23)
Chloride: 104 mEq/L (ref 96–112)
Glucose, Bld: 107 mg/dL — ABNORMAL HIGH (ref 70–99)
Potassium: 4.8 mEq/L (ref 3.5–5.1)

## 2013-01-03 LAB — LIPID PANEL
Cholesterol: 141 mg/dL (ref 0–200)
HDL: 43.2 mg/dL
LDL Cholesterol: 81 mg/dL (ref 0–99)
Total CHOL/HDL Ratio: 3
Triglycerides: 84 mg/dL (ref 0.0–149.0)
VLDL: 16.8 mg/dL (ref 0.0–40.0)

## 2013-01-03 LAB — PSA: PSA: 4.54 ng/mL — ABNORMAL HIGH (ref 0.10–4.00)

## 2013-01-08 ENCOUNTER — Other Ambulatory Visit: Payer: Self-pay | Admitting: Internal Medicine

## 2013-01-08 ENCOUNTER — Encounter: Payer: Self-pay | Admitting: Internal Medicine

## 2013-01-08 ENCOUNTER — Ambulatory Visit (INDEPENDENT_AMBULATORY_CARE_PROVIDER_SITE_OTHER): Payer: Medicare Other | Admitting: *Deleted

## 2013-01-08 DIAGNOSIS — I428 Other cardiomyopathies: Secondary | ICD-10-CM

## 2013-01-08 LAB — ICD DEVICE OBSERVATION
CHARGE TIME: 13.1 s
DEV-0020ICD: NEGATIVE
RV LEAD AMPLITUDE: 9.9 mv
RV LEAD IMPEDENCE ICD: 362.5 Ohm
TOT-0007: 2
TOT-0008: 0
TOT-0009: 1
TOT-0010: 31
TZAT-0004SLOWVT: 8
TZAT-0012FASTVT: 200 ms
TZAT-0012SLOWVT: 200 ms
TZAT-0013FASTVT: 2
TZAT-0013SLOWVT: 4
TZAT-0018FASTVT: NEGATIVE
TZAT-0018SLOWVT: NEGATIVE
TZAT-0019SLOWVT: 7.5 V
TZAT-0020FASTVT: 1 ms
TZON-0003FASTVT: 300 ms
TZON-0003SLOWVT: 340 ms
TZON-0004SLOWVT: 20
TZON-0005FASTVT: 6
TZON-0010SLOWVT: 80 ms
TZST-0001FASTVT: 2
TZST-0001SLOWVT: 3
TZST-0001SLOWVT: 5
TZST-0003FASTVT: 36 J
TZST-0003FASTVT: 36 J
TZST-0003FASTVT: 36 J
TZST-0003SLOWVT: 36 J
TZST-0003SLOWVT: 36 J

## 2013-01-08 NOTE — Progress Notes (Signed)
ICD check 

## 2013-01-10 ENCOUNTER — Encounter (INDEPENDENT_AMBULATORY_CARE_PROVIDER_SITE_OTHER): Payer: Medicare Other | Admitting: Endocrinology

## 2013-01-10 ENCOUNTER — Encounter: Payer: Self-pay | Admitting: Endocrinology

## 2013-01-10 DIAGNOSIS — D696 Thrombocytopenia, unspecified: Secondary | ICD-10-CM

## 2013-01-10 DIAGNOSIS — Z0389 Encounter for observation for other suspected diseases and conditions ruled out: Secondary | ICD-10-CM

## 2013-01-10 DIAGNOSIS — D509 Iron deficiency anemia, unspecified: Secondary | ICD-10-CM

## 2013-01-10 DIAGNOSIS — Z0000 Encounter for general adult medical examination without abnormal findings: Secondary | ICD-10-CM

## 2013-01-10 DIAGNOSIS — E109 Type 1 diabetes mellitus without complications: Secondary | ICD-10-CM

## 2013-02-04 ENCOUNTER — Encounter: Payer: Medicare Other | Admitting: Endocrinology

## 2013-02-12 ENCOUNTER — Encounter: Payer: Self-pay | Admitting: Endocrinology

## 2013-02-12 ENCOUNTER — Ambulatory Visit (INDEPENDENT_AMBULATORY_CARE_PROVIDER_SITE_OTHER): Payer: Medicare Other | Admitting: Endocrinology

## 2013-02-12 VITALS — BP 134/78 | HR 90 | Wt 192.0 lb

## 2013-02-12 DIAGNOSIS — R972 Elevated prostate specific antigen [PSA]: Secondary | ICD-10-CM

## 2013-02-12 NOTE — Patient Instructions (Addendum)
Take iron, 1 pill per day. Refer to a urology specialist.  you will receive a phone call, about a day and time for an appointment Please come back for a follow-up appointment in 3 months. please consider these measures for your health:  minimize alcohol.  do not use tobacco products.  have a colonoscopy at least every 10 years from age 70.  keep firearms safely stored.  always use seat belts.  have working smoke alarms in your home.  see an eye doctor and dentist regularly.  never drive under the influence of alcohol or drugs (including prescription drugs).  those with fair skin should take precautions against the sun. it is critically important to prevent falling down (keep floor areas well-lit, dry, and free of loose objects.  If you have a cane, walker, or wheelchair, you should use it, even for short trips around the house.  Also, try not to rush)

## 2013-02-12 NOTE — Progress Notes (Signed)
Subjective:    Patient ID: Billy Bush, male    DOB: 09/23/1941, 72 y.o.   MRN: [id number]  HPI here for regular wellness examination.  He's feeling pretty well in general, and says chronic med probs are stable, except as noted below Past Medical History  Diagnosis Date  . Dyslipidemia   . Hypertension   . History of colonic polyps   . Thrombocytopenia   . Nonproliferative diabetic retinopathy NOS(362.03)   . Impotence of organic origin   . Iron deficiency anemia, unspecified   . Special screening for malignant neoplasm of prostate   . Routine general medical examination at a health care facility   . Unspecified hereditary and idiopathic peripheral neuropathy   . Hyperlipidemia   . Diabetes mellitus   . Depressive disorder, not elsewhere classified   . Coronary artery disease   . CHF (congestive heart failure)   . Anxiety state, unspecified     Past Surgical History  Procedure Laterality Date  . Coronary artery bypass graft      X 3  . Cardiac catheterization  05/23/07  . Insert / replace / remove pacemaker  07/05/07    [employer]DE SINGLE-CHAMBER DEFIBRILLATOR, [name], MD    History   Social History  . Marital Status: Married    Spouse Name: N/A    Number of Children: 4  . Years of Education: N/A   Occupational History  . PASTOR    Social History Main Topics  . Smoking status: Former Smoker    Types: Cigarettes    Quit date: 11/27/1996  . Smokeless tobacco: Never Used  . Alcohol Use: No  . Drug Use: No  . Sexually Active: Not on file   Other Topics Concern  . Not on file   Social History Narrative   [employer]   MARRIED   4 BOYS   FORMER SMOKER, QUIT 14 YRS   ALCOHOL USE -NO   NO ILLICIT DRUG USE         ICD-St. [employer]UDE      PATIENT SIGNED A DESIGNATED PARTY RELEASE TO ALLOW HIS WIFE, GLENDA Rockers, TO HAVE ACCESS TO HIS MEDICAL RECORDS/INFORMATION.    [name], March 21, 2010 9:42 AM    Current Outpatient Prescriptions on File Prior to  Visit  Medication Sig Dispense Refill  . aspirin 325 MG EC tablet Take 325 mg by mouth daily.        Marland Kitchen atorvastatin (LIPITOR) 80 MG tablet TAKE 1 TABLET AT BEDTIME  90 tablet  0  . carvedilol (COREG) 12.5 MG tablet TAKE 1 TABLET (12.5MG  TOTAL ) BY MOUTH 2 TIMES DAILY WITH A MEAL  180 tablet  2  . CHONDROITIN SULFATE A PO Take 1,200 mg by mouth.      . citalopram (CELEXA) 10 MG tablet Take 1 tablet (10 mg total) by mouth daily.  90 tablet  2  . clopidogrel (PLAVIX) 75 MG tablet TAKE 1 TABLET BY MOUTH ONCE DAILY  90 tablet  2  . co-enzyme Q-10 30 MG capsule Take 30 mg by mouth daily.        . furosemide (LASIX) 80 MG tablet Take 1 tablet (80 mg total) by mouth daily.  90 tablet  1  . Glucosamine HCl 1500 MG TABS Take 1 tablet by mouth daily.        Marland Kitchen glucose blood (ONE TOUCH ULTRA TEST) test strip Use as directed twice daily dx 250.01  200 each  3  . insulin lispro (  HUMALOG) 100 UNIT/ML injection Inject into the skin 3x a day (just before each meal) 11-04-27 units  15 mL  2  . Insulin Pen Needle (BD PEN NEEDLE NANO U/F) 32G X 4 MM MISC Use as directed four times daily  400 each  3  . Multiple Vitamin (MULTIVITAMIN) tablet Take 1 tablet by mouth daily.        . nitroGLYCERIN (NITROSTAT) 0.4 MG SL tablet Place 1 tablet (0.4 mg total) under the tongue every 5 (five) minutes as needed for chest pain.  25 tablet  3  . ramipril (ALTACE) 5 MG capsule TAKE 1 CAPSULE BY MOUTH DAILY  90 capsule  3  . spironolactone (ALDACTONE) 25 MG tablet Take 1 tablet (25 mg total) by mouth daily.  90 tablet  1   No current facility-administered medications on file prior to visit.    No Known Allergies  No family history on file.  BP 134/78  Pulse 90  Wt 192 lb (87.091 kg)  BMI 29.2 kg/m2  SpO2 97%     Review of Systems  Constitutional: Negative for fever.  HENT: Negative for hearing loss.   Eyes: Negative for visual disturbance.  Respiratory: Negative for shortness of breath.   Cardiovascular: Negative  for chest pain.  Gastrointestinal: Negative for anal bleeding.  Endocrine: Negative for cold intolerance.  Genitourinary: Negative for dysuria.  Musculoskeletal: Negative for back pain.  Skin: Negative for rash.  Allergic/Immunologic: Negative for environmental allergies.  Neurological: Negative for numbness.  Hematological: Bruises/bleeds easily.  Psychiatric/Behavioral: Negative for dysphoric mood.       Objective:   Physical Exam VS: see vs page GEN: no distress HEAD: head: no deformity eyes: no periorbital swelling, no proptosis external nose and ears are normal mouth: no lesion seen NECK: supple, thyroid is not enlarged CHEST WALL: no deformity LUNGS: clear to auscultation BREASTS:  No gynecomastia CV: reg rate and rhythm, no murmur ABD: abdomen is soft, nontender.  no hepatosplenomegaly.  not distended.  no hernia MUSCULOSKELETAL: muscle bulk and strength are grossly normal.  no obvious joint swelling.  gait is normal and steady PULSES: dorsalis pedis intact bilat.  no carotid bruit.   NEURO:  cn 2-12 grossly intact.   readily moves all 4's.  sensation is intact to touch on the feet.   SKIN:  Normal texture and temperature.  No rash or suspicious lesion is visible.   NODES:  None palpable at the neck PSYCH: alert, oriented x3.  Does not appear anxious nor depressed.     Assessment & Plan:  Wellness visit today, with problems stable, except as noted.  we discussed code status.  pt requests full code, but would not want to be started or maintained on artificial life-support measures if there was not a reasonable chance of recovery    SEPARATE EVALUATION FOLLOWS--EACH PROBLEM HERE IS NEW, NOT RESPONDING TO TREATMENT, OR POSES SIGNIFICANT RISK TO THE PATIENT'S HEALTH: HISTORY OF THE PRESENT ILLNESS:  Elevated psa is recently noted.  Denies decreased urinary stream at the urethra, or assoc hematuria. fe-deficiency anemia: he has had this for several years, and it has been  mild.  pt does not take fe tabs PAST MEDICAL HISTORY reviewed and up to date today REVIEW OF SYSTEMS: Denies and weight change syncope PHYSICAL EXAMINATION: VITAL SIGNS:  See vs page GENERAL: no distress EXTEMITIES: no deformity.  no ulcer on the feet.  feet are of normal color and temp.  Trace bilat leg edema.  There is bilateral  onychomycosis and varicosities.  There is rust-colored discoloration of the legs. LAB/XRAY RESULTS: Lab Results  Component Value Date   WBC 6.4 01/03/2013   HGB 11.3* 01/03/2013   HCT 33.2* 01/03/2013   PLT 157.0 01/03/2013   GLUCOSE 107* 01/03/2013   CHOL 141 01/03/2013   TRIG 84.0 01/03/2013   HDL 43.20 01/03/2013   LDLCALC 81 01/03/2013   ALT 14 01/03/2013   AST 19 01/03/2013   NA 138 01/03/2013   K 4.8 01/03/2013   CL 104 01/03/2013   CREATININE 1.1 01/03/2013   BUN 23 01/03/2013   CO2 28 01/03/2013   TSH 2.48 01/03/2013   PSA 4.54* 01/03/2013   INR 1.0 RATIO 07/02/2007   HGBA1C 6.6* 01/03/2013   MICROALBUR 0.3 01/03/2013  IMPRESSION: Anemia, needs increased rx Elevated psa, new PLAN: See instruction page

## 2013-02-17 ENCOUNTER — Encounter: Payer: Self-pay | Admitting: Endocrinology

## 2013-02-17 NOTE — Telephone Encounter (Signed)
Please refer this to pcc

## 2013-02-19 ENCOUNTER — Other Ambulatory Visit: Payer: Self-pay

## 2013-02-20 ENCOUNTER — Encounter: Payer: Self-pay | Admitting: Endocrinology

## 2013-02-21 ENCOUNTER — Other Ambulatory Visit: Payer: Self-pay

## 2013-02-21 MED ORDER — CITALOPRAM HYDROBROMIDE 10 MG PO TABS: 10.0000 mg | ORAL_TABLET | Freq: Every day | ORAL | Status: DC

## 2013-04-01 ENCOUNTER — Ambulatory Visit: Payer: Medicare Other | Admitting: Nurse Practitioner

## 2013-04-18 ENCOUNTER — Ambulatory Visit (INDEPENDENT_AMBULATORY_CARE_PROVIDER_SITE_OTHER): Payer: Medicare Other | Admitting: Physician Assistant

## 2013-04-18 ENCOUNTER — Ambulatory Visit (INDEPENDENT_AMBULATORY_CARE_PROVIDER_SITE_OTHER): Payer: Medicare Other | Admitting: *Deleted

## 2013-04-18 ENCOUNTER — Encounter: Payer: Self-pay | Admitting: Physician Assistant

## 2013-04-18 VITALS — BP 110/60 | HR 55 | Ht 67.0 in | Wt 188.0 lb

## 2013-04-18 DIAGNOSIS — I5022 Chronic systolic (congestive) heart failure: Secondary | ICD-10-CM

## 2013-04-18 DIAGNOSIS — E785 Hyperlipidemia, unspecified: Secondary | ICD-10-CM

## 2013-04-18 DIAGNOSIS — I2510 Atherosclerotic heart disease of native coronary artery without angina pectoris: Secondary | ICD-10-CM

## 2013-04-18 DIAGNOSIS — I2589 Other forms of chronic ischemic heart disease: Secondary | ICD-10-CM

## 2013-04-18 DIAGNOSIS — I10 Essential (primary) hypertension: Secondary | ICD-10-CM

## 2013-04-18 DIAGNOSIS — Z95810 Presence of automatic (implantable) cardiac defibrillator: Secondary | ICD-10-CM

## 2013-04-18 LAB — ICD DEVICE OBSERVATION
BATTERY VOLTAGE: 2.6019 V
DEV-0020ICD: NEGATIVE
HV IMPEDENCE: 43 Ohm
PACEART VT: 0
TOT-0008: 0
TOT-0009: 1
TOT-0010: 35
TZAT-0004FASTVT: 8
TZAT-0012FASTVT: 200 ms
TZAT-0012SLOWVT: 200 ms
TZAT-0013FASTVT: 2
TZAT-0013SLOWVT: 4
TZAT-0018FASTVT: NEGATIVE
TZAT-0018SLOWVT: NEGATIVE
TZAT-0019FASTVT: 7.5 V
TZAT-0020FASTVT: 1 ms
TZAT-0020SLOWVT: 1 ms
TZON-0003FASTVT: 300 ms
TZON-0003SLOWVT: 340 ms
TZON-0004FASTVT: 16
TZON-0004SLOWVT: 20
TZON-0005FASTVT: 6
TZON-0005SLOWVT: 6
TZST-0001FASTVT: 3
TZST-0001FASTVT: 4
TZST-0001FASTVT: 5
TZST-0001SLOWVT: 4
TZST-0001SLOWVT: 5
TZST-0003FASTVT: 36 J
TZST-0003SLOWVT: 36 J
TZST-0003SLOWVT: 36 J
VENTRICULAR PACING ICD: 0.62 pct

## 2013-04-18 NOTE — Progress Notes (Signed)
ICD check in clinic 

## 2013-04-18 NOTE — Progress Notes (Signed)
[address] [place], [place]  [address] Phone: [phone] Fax:  [phone]  Date:  04/18/2013   ID:  Billy Bush, DOB 1941/09/18, MRN [id number]  PCP:  Romero Belling, MD  Cardiologist:  Dr. Valera Castle   Electrophysiologist:  Dr. [name]    History of Present Illness: Billy Bush is a 72 y.o. male who returns for follow up.  He has a history of CAD, status post CABG and mitral valve annuloplasty in 1997, status post Cypher DES to the left main 04/2007, ischemic cardiomyopathy, status post AICD, systolic CHF, DM2, HTN, HL.  LHC 04/2007: Distal left main 80% treated with PCI, LIMA-LAD patent, SVG-OM patent, SVG-PDA patent, EF 30% with inferior HK.  Echo 09/2011: EF 30-35%, apical AK, basal inferior AK, mild AI, moderate MR, moderate LAE.  Last seen by Dr. Daleen Squibb 09/2012.  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  He has been planting a box garden at home.  Still visits parishioners at the hospital.  The patient denies chest pain, significant shortness of breath, syncope, orthopnea, PND or significant pedal edema.   Labs (2/14):  K 4.8, Cr 1.1, ALT 14, LDL 81, Hgb 11.3, TSH 2.48  Wt Readings from Last 3 Encounters:  04/18/13 188 lb (85.276 kg)  02/12/13 192 lb (87.091 kg)  10/03/12 188 lb (85.276 kg)     Past Medical History  Diagnosis Date  . Dyslipidemia   . Hypertension   . History of colonic polyps   . Thrombocytopenia   . Nonproliferative diabetic retinopathy NOS(362.03)   . Impotence of organic origin   . Iron deficiency anemia, unspecified   . Unspecified hereditary and idiopathic peripheral neuropathy   . Diabetes mellitus   . Depressive disorder, not elsewhere classified   . Coronary artery disease     a. s/p CABG 1997; b. LHC 04/2007: Distal left main 80% treated with PCI (Cypher DES), LIMA-LAD patent, SVG-OM patent, SVG-PDA patent, EF 30% with inferior HK  . Chronic systolic heart failure     a. Echo 09/2011: EF 30-35%, apical AK, basal  inferior AK, mild AI, moderate MR, moderate LAE  . Anxiety state, unspecified   . Ischemic cardiomyopathy   . S/P ICD (internal cardiac defibrillator) procedure     Current Outpatient Prescriptions  Medication Sig Dispense Refill  . aspirin 325 MG EC tablet Take 325 mg by mouth daily.        Marland Kitchen atorvastatin (LIPITOR) 80 MG tablet TAKE 1 TABLET AT BEDTIME  90 tablet  0  . carvedilol (COREG) 12.5 MG tablet TAKE 1 TABLET (12.5MG  TOTAL ) BY MOUTH 2 TIMES DAILY WITH A MEAL  180 tablet  2  . CHONDROITIN SULFATE A PO Take 1,200 mg by mouth.      . citalopram (CELEXA) 10 MG tablet Take 1 tablet (10 mg total) by mouth daily.  90 tablet  2  . clopidogrel (PLAVIX) 75 MG tablet TAKE 1 TABLET BY MOUTH ONCE DAILY  90 tablet  2  . co-enzyme Q-10 30 MG capsule Take 30 mg by mouth daily.        . furosemide (LASIX) 80 MG tablet Take 1 tablet (80 mg total) by mouth daily.  90 tablet  1  . Glucosamine HCl 1500 MG TABS Take 1 tablet by mouth daily.        Marland Kitchen glucose blood (ONE TOUCH ULTRA TEST) test strip Use as directed twice daily dx 250.01  200 each  3  . insulin  lispro (HUMALOG) 100 UNIT/ML injection Inject into the skin 3x a day (just before each meal) 11-04-27 units  15 mL  2  . Insulin Pen Needle (BD PEN NEEDLE NANO U/F) 32G X 4 MM MISC Use as directed four times daily  400 each  3  . Multiple Vitamin (MULTIVITAMIN) tablet Take 1 tablet by mouth daily.        . nitroGLYCERIN (NITROSTAT) 0.4 MG SL tablet Place 1 tablet (0.4 mg total) under the tongue every 5 (five) minutes as needed for chest pain.  25 tablet  3  . ramipril (ALTACE) 5 MG capsule TAKE 1 CAPSULE BY MOUTH DAILY  90 capsule  3  . spironolactone (ALDACTONE) 25 MG tablet Take 1 tablet (25 mg total) by mouth daily.  90 tablet  1   No current facility-administered medications for this visit.    Allergies:   No Known Allergies  Social History:  The patient  reports that he quit smoking about 16 years ago. His smoking use included Cigarettes. He  smoked 0.00 packs per day. He has never used smokeless tobacco. He reports that he does not drink alcohol or use illicit drugs.   ROS:  Please see the history of present illness.    All other systems reviewed and negative.   PHYSICAL EXAM: VS:  BP 110/60  Pulse 55  Ht 5\' 7"  (1.702 m)  Wt 188 lb (85.276 kg)  BMI 29.44 kg/m2 Well nourished, well developed, in no acute distress HEENT: normal Neck: no JVD Cardiac:  normal S1, S2; RRR; no murmur Lungs:  clear to auscultation bilaterally, no wheezing, rhonchi or rales Abd: soft, nontender, no hepatomegaly Ext: no edema Skin: warm and dry Neuro:  CNs 2-12 intact, no focal abnormalities noted  EKG:  Sinus bradycardia, HR 55, normal axis, lateral T wave inversions, no change from prior trace     ASSESSMENT AND PLAN:  1. CAD: Doing well without angina. Continue aspirin, Plavix and statin. 2. Chronic Systolic CHF:  Stable volume. Continue current therapy. Recent potassium and creatinine stable. 3. Ischemic Cardiomyopathy:  Continue beta blocker, ACE inhibitor and spironolactone. 4. Status Post AICD: Device interrogated today. No therapies delivered since last assessed. Followup with Dr. Ladona Ridgel as planned. 5. Hypertension: Controlled. 6. Hyperlipidemia: Continue statin. 7. Diabetes mellitus:  Followed by primary care. 8. Disposition: Discussed with Dr. Daleen Squibb today. Patient will be followed in the future by Dr. Shirlee Latch. Arrange follow up in 6 months  Signed, Tereso Newcomer, PA-C  04/18/2013 10:08 AM

## 2013-04-18 NOTE — Patient Instructions (Addendum)
MAKE SURE TO FOLLOW UP IN AUGUST FOR YOUR DEVICE CHECK  NO CHANGES WERE MADE TODAY  Your physician wants you to follow-up in: 6 MONTHS WITH DR. Shirlee Latch. You will receive a reminder letter in the mail two months in advance. If you don't receive a letter, please call our office to schedule the follow-up appointment.

## 2013-05-01 ENCOUNTER — Encounter: Payer: Self-pay | Admitting: Internal Medicine

## 2013-06-05 ENCOUNTER — Other Ambulatory Visit: Payer: Self-pay | Admitting: *Deleted

## 2013-06-05 MED ORDER — CLOPIDOGREL BISULFATE 75 MG PO TABS: ORAL_TABLET | ORAL | Status: DC

## 2013-06-09 ENCOUNTER — Other Ambulatory Visit: Payer: Self-pay | Admitting: *Deleted

## 2013-06-09 ENCOUNTER — Telehealth: Payer: Self-pay | Admitting: Gastroenterology

## 2013-06-09 MED ORDER — CARVEDILOL 12.5 MG PO TABS: ORAL_TABLET | ORAL | Status: DC

## 2013-06-09 MED ORDER — FUROSEMIDE 80 MG PO TABS: 80.0000 mg | ORAL_TABLET | Freq: Every day | ORAL | Status: DC

## 2013-06-09 NOTE — Telephone Encounter (Signed)
Spoke with patient's wife and scheduled patient on 06/12/13 at 1:30 PM. Patient could not come in AM due to another appointment.

## 2013-06-12 ENCOUNTER — Encounter: Payer: Self-pay | Admitting: Gastroenterology

## 2013-06-12 ENCOUNTER — Ambulatory Visit (INDEPENDENT_AMBULATORY_CARE_PROVIDER_SITE_OTHER): Payer: Medicare Other | Admitting: Physician Assistant

## 2013-06-12 ENCOUNTER — Encounter: Payer: Self-pay | Admitting: Physician Assistant

## 2013-06-12 VITALS — BP 110/60 | HR 60 | Ht 68.5 in | Wt 179.2 lb

## 2013-06-12 DIAGNOSIS — R1314 Dysphagia, pharyngoesophageal phase: Secondary | ICD-10-CM

## 2013-06-12 DIAGNOSIS — R110 Nausea: Secondary | ICD-10-CM

## 2013-06-12 DIAGNOSIS — R634 Abnormal weight loss: Secondary | ICD-10-CM

## 2013-06-12 MED ORDER — ONDANSETRON HCL 4 MG PO TABS: ORAL_TABLET | ORAL | Status: DC

## 2013-06-12 MED ORDER — PANTOPRAZOLE SODIUM 40 MG PO TBEC: 40.0000 mg | DELAYED_RELEASE_TABLET | Freq: Every day | ORAL | Status: DC

## 2013-06-12 NOTE — Progress Notes (Signed)
Subjective:    Patient ID: Billy Bush, male    DOB: [birth year], 72 y.o.   MRN: [id number]  HPI  Billy Bush is a pleasant 72 year old white male known to Dr. Arlyce Dice from prior colonoscopies. He last had colonoscopy in February of 10 and this was a negative exam. He does have prior history of adenomatous colon polyps. He has not had any prior EGD. Patient has severe cardiac disease with coronary artery disease he is status post CABG and had a drug-eluting stent placed in 2008. He's also had a mitral valve annuloplasty, has history of chronic congestive heart failure ischemic cardiomyopathy with EF of 30-35% and is status post defibrillator placement. He also has insulin-dependent diabetes peripheral neuropathy. He is maintained on chronic Plavix and aspirin He comes in today with new complaint of dysphagia which has been present over the past 5 months with gradual progression. According to his wife he has been having more trouble over the past month primarily with bread and meat. He is eating less and his weight is down about 10 pounds. He is not having any episodes of regurgitation but says his food feels as if it's very slow to go down through his esophagus into his stomach. He denies any heartburn or indigestion no chest pain. He has been having some nausea and queasiness over the past month as well and has not actually been vomiting. He has no complaints of abdominal pain or changes in his bowel habits no melena or hematochezia. His not been started on any new medications.    Review of Systems  Constitutional: Positive for unexpected weight change.  HENT: Positive for trouble swallowing.   Eyes: Negative.   Respiratory: Negative.   Cardiovascular: Negative.   Gastrointestinal: Positive for nausea.  Endocrine: Negative.   Genitourinary: Negative.   Musculoskeletal: Negative.   Skin: Negative.   Allergic/Immunologic: Negative.   Neurological: Negative.   Hematological: Negative.    Psychiatric/Behavioral: Negative.    Outpatient Prescriptions Prior to Visit  Medication Sig Dispense Refill  . aspirin 325 MG EC tablet Take 325 mg by mouth daily.        Marland Kitchen atorvastatin (LIPITOR) 80 MG tablet TAKE 1 TABLET AT BEDTIME  90 tablet  0  . carvedilol (COREG) 12.5 MG tablet TAKE 1 TABLET (12.5MG  TOTAL ) BY MOUTH 2 TIMES DAILY WITH A MEAL  180 tablet  3  . citalopram (CELEXA) 10 MG tablet Take 1 tablet (10 mg total) by mouth daily.  90 tablet  2  . clopidogrel (PLAVIX) 75 MG tablet TAKE 1 TABLET BY MOUTH ONCE DAILY  90 tablet  1  . co-enzyme Q-10 30 MG capsule Take 30 mg by mouth daily.        . furosemide (LASIX) 80 MG tablet Take 1 tablet (80 mg total) by mouth daily.  90 tablet  3  . glucose blood (ONE TOUCH ULTRA TEST) test strip Use as directed twice daily dx 250.01  200 each  3  . insulin lispro (HUMALOG) 100 UNIT/ML injection Inject into the skin 3x a day (just before each meal) 11-04-27 units  15 mL  2  . Insulin Pen Needle (BD PEN NEEDLE NANO U/F) 32G X 4 MM MISC Use as directed four times daily  400 each  3  . Multiple Vitamin (MULTIVITAMIN) tablet Take 1 tablet by mouth daily.        . nitroGLYCERIN (NITROSTAT) 0.4 MG SL tablet Place 1 tablet (0.4 mg total) under the tongue every 5 (five) minutes  as needed for chest pain.  25 tablet  3  . ramipril (ALTACE) 5 MG capsule TAKE 1 CAPSULE BY MOUTH DAILY  90 capsule  3  . spironolactone (ALDACTONE) 25 MG tablet Take 1 tablet (25 mg total) by mouth daily.  90 tablet  1  . CHONDROITIN SULFATE A PO Take 1,200 mg by mouth.      . Glucosamine HCl 1500 MG TABS Take 1 tablet by mouth daily.         No facility-administered medications prior to visit.   No Known Allergies Patient Active Problem List   Diagnosis Date Noted  . Abnormal PSA 02/12/2013  . Encounter for long-term (current) use of other medications 10/13/2011  . Prostate cancer screening 10/13/2011  . LBBB (left bundle branch block) 03/09/2011  . MITRAL REGURGITATION,  1-2 PLUS 12/17/2009  . CAD, ARTERY BYPASS GRAFT 12/17/2009  . SYSTOLIC HEART FAILURE, CHRONIC 12/17/2009  . ICD-St.Jude 04/15/2009  . HYPERTENSION 01/05/2009  . ANEMIA, IRON DEFICIENCY 12/07/2008  . THROMBOCYTOPENIA 12/07/2008  . NONPROLIFERATIVE DIABETIC RETINOPATHY NOS 12/07/2008  . 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 DIABETES MELLITUS, TYPE I 08/05/2007  . Other and Unspecified Hyperlipidemia 08/05/2007  . ANXIETY 08/05/2007  . DEPRESSION 08/05/2007  . PERIPHERAL NEUROPATHY 08/05/2007  . CORONARY ARTERY DISEASE 08/05/2007  . COLONIC POLYPS 10/06/2003   History  Substance Use Topics  . Smoking status: Former Smoker    Types: Cigarettes    Quit date: 11/27/1996  . Smokeless tobacco: Never Used  . Alcohol Use: No   family history is not on file.     Objective:   Physical Exam  Well-developed older white male in no acute distress, very pleasant accompanied by his wife blood pressure 110/60 pulse 60 height 5 foot 8 weight 179. HEENT; nontraumatic normocephalic EOMI PERRLA sclera anicteric, Supple; no JVD, Cardiovascular; regular rate and rhythm with S1-S2 there is a soft systolic murmur, he has a defibrillator in the left chest wall, Pulmonary; clear bilaterally, Abdomen; soft nondistended basically nontender there is no palpable mass or hepatosplenomegaly bowel sounds are present no bruit heard, Rectal exam not done, Extremities; no clubbing cyanosis or edema skin warm and dry, Psych; mood and affect normal and appropriate       Assessment & Plan:  #22  72 year old white male with 5 month history of somewhat progressive solid food dysphagia now with one month history of nausea and queasiness without vomiting Patient has not had any prior upper GI issues and has no complaints of heartburn or indigestion Will need to rule out esophageal stricture or occult malignancy #2 chronic antiplatelet therapy with Plavix and aspirin #3 ischemic cardiomyopathy with EF of 30-35% #4  status post defibrillator placement #5 coronary artery disease status post CABG and stent placement 2008 #6 insulin-dependent diabetes mellitus with peripheral neuropathy #7 remote history of adenomatous colon polyps negative colonoscopy 2010.  Plan; will start Protonix 40 mg by mouth daily was to take this away from his Plavix Start Zofran 4 mg every 6 hours as needed for nausea he is encouraged to take this one half hour before eating. Will schedule for upper endoscopy with possible esophageal dilation with Dr. Arlyce Dice. This will be scheduled at [facility] long given his decreased ejection fraction. Patient will need to come off of Plavix for 5 days prior to the procedure and we'll obtain consent from his cardiologist Dr. Jearld Pies If the EGD is unrevealing will need further imaging with CT scan of the abdomen and pelvis

## 2013-06-12 NOTE — Patient Instructions (Addendum)
We have scheduled the Endoscopy with Dr. Arlyce Dice at the hospital Endoscopy unit. You have been scheduled for an endoscopy with propofol. Please follow written instructions given to you at your visit today.  We sent prescriptions for Zofran and Protonix [facility] [address]  We will call you once we hear from Dr. Shirlee Latch regarding the plavix.

## 2013-06-13 ENCOUNTER — Other Ambulatory Visit: Payer: Self-pay | Admitting: *Deleted

## 2013-06-13 ENCOUNTER — Telehealth: Payer: Self-pay | Admitting: Internal Medicine

## 2013-06-13 MED ORDER — CLOPIDOGREL BISULFATE 75 MG PO TABS: ORAL_TABLET | ORAL | Status: DC

## 2013-06-13 MED ORDER — FUROSEMIDE 80 MG PO TABS: 80.0000 mg | ORAL_TABLET | Freq: Every day | ORAL | Status: DC

## 2013-06-13 NOTE — Telephone Encounter (Signed)
Left pt a detail message that is fine for him to take Zofran medication per Weston Brass PH-D ( no interaction with the medication  pt is currently taking)

## 2013-06-13 NOTE — Telephone Encounter (Signed)
New Prob     Wife calling to see if pt should be concerned about any possible drug interactions with ONDANSETRON HCL. Please call.

## 2013-06-13 NOTE — Progress Notes (Signed)
Reviewed and agree with management. Jacinda Kanady D. Myna Freimark, M.D., FACG  

## 2013-06-16 ENCOUNTER — Telehealth: Payer: Self-pay | Admitting: *Deleted

## 2013-06-16 NOTE — Telephone Encounter (Signed)
Message copied by Derry Skill on Mon Jun 16, 2013 10:59 AM ------      Message from: Laurey Morale      Created: Fri Jun 13, 2013 12:21 PM      Regarding: RE: Plavix clearance for Endoscopy       He can hold Plavix 5 days prior to endoscopy and restart afterwards.       ----- Message -----         From: Derry Skill, CMA         Sent: 06/12/2013   2:34 PM           To: Laurey Morale, MD      Subject: Plavix clearance for Endoscopy                           06/12/2013                        RE: Billy Bush      DOB: [birth year]      MRN: [id number]                  Dear Dr. Marca Ancona,                   We have scheduled the above patient for an endoscopic procedure. Our records show that he is on anticoagulation therapy.             Please advise as to how long the patient may come off his therapy of Plavix prior to the procedure, which is scheduled for 07-08-2013.            Please fax back/ or route the completed form to [facility] CMA at [phone].             Sincerely,                        Amy Esterwood                     ------

## 2013-06-16 NOTE — Telephone Encounter (Signed)
LM for patient that we heard from Dr.  Marca Ancona and he can stop the Plavix on 07-03-2013 and resume it on 07-09-2013.  I left my name and number if he has any questions.

## 2013-07-02 ENCOUNTER — Telehealth: Payer: Self-pay | Admitting: Cardiology

## 2013-07-02 DIAGNOSIS — I2510 Atherosclerotic heart disease of native coronary artery without angina pectoris: Secondary | ICD-10-CM

## 2013-07-02 DIAGNOSIS — I2589 Other forms of chronic ischemic heart disease: Secondary | ICD-10-CM

## 2013-07-02 NOTE — Telephone Encounter (Signed)
New Problem   86/42 BP  53 Heart rate   Woke up very light headed// Unable to stand or walk. Pt would like for a nurse to advise him on what he should do.

## 2013-07-02 NOTE — Telephone Encounter (Signed)
Pt's wife states pt lightheaded yesterday morning. Pt lightheaded this morning. A short time ago  BP was 86/42 pulse 53. Pt has been off Plavix for endo 07/08/13 due to problems swallowing. Pt has also taken dramamine for the last 3 days for nausea. Pt has not been eating normally due to problems swallowing.

## 2013-07-02 NOTE — Telephone Encounter (Signed)
Reviewed with Tereso Newcomer, PA,c. He recommended pt hold spironolactone and lasix x 1 day, then resume lasix 1/2 daily and spironolactone 1/2 daily, decrease coreg to 12.5mg  (1/2) tablet bid, BMET on 07/04/13. If patient passes out or symptoms get worse pt should report to ED. Pt's wife advised, verbalized understanding.

## 2013-07-04 ENCOUNTER — Other Ambulatory Visit (INDEPENDENT_AMBULATORY_CARE_PROVIDER_SITE_OTHER): Payer: Medicare Other

## 2013-07-04 DIAGNOSIS — I2510 Atherosclerotic heart disease of native coronary artery without angina pectoris: Secondary | ICD-10-CM

## 2013-07-04 DIAGNOSIS — I2589 Other forms of chronic ischemic heart disease: Secondary | ICD-10-CM

## 2013-07-04 LAB — BASIC METABOLIC PANEL
Calcium: 8.9 mg/dL (ref 8.4–10.5)
Chloride: 104 mEq/L (ref 96–112)
Creatinine, Ser: 1 mg/dL (ref 0.4–1.5)

## 2013-07-08 ENCOUNTER — Other Ambulatory Visit: Payer: Self-pay | Admitting: Gastroenterology

## 2013-07-08 ENCOUNTER — Encounter (HOSPITAL_COMMUNITY)
Admission: RE | Disposition: A | Discharge status: Home / Self Care | Payer: Self-pay | Source: Ambulatory Visit | Attending: Gastroenterology

## 2013-07-08 ENCOUNTER — Telehealth: Payer: Self-pay

## 2013-07-08 ENCOUNTER — Encounter (HOSPITAL_COMMUNITY): Payer: Self-pay

## 2013-07-08 ENCOUNTER — Telehealth: Payer: Self-pay | Admitting: Oncology

## 2013-07-08 ENCOUNTER — Ambulatory Visit (HOSPITAL_COMMUNITY)
Admission: RE | Admit: 2013-07-08 | Discharge: 2013-07-08 | Disposition: A | Discharge status: Home / Self Care | Payer: Medicare Other | Source: Ambulatory Visit | Attending: Gastroenterology | Admitting: Gastroenterology

## 2013-07-08 DIAGNOSIS — D379 Neoplasm of uncertain behavior of digestive organ, unspecified: Secondary | ICD-10-CM

## 2013-07-08 DIAGNOSIS — R110 Nausea: Secondary | ICD-10-CM

## 2013-07-08 DIAGNOSIS — Z7902 Long term (current) use of antithrombotics/antiplatelets: Secondary | ICD-10-CM | POA: Insufficient documentation

## 2013-07-08 DIAGNOSIS — Z860100 Personal history of colon polyps, unspecified: Secondary | ICD-10-CM | POA: Insufficient documentation

## 2013-07-08 DIAGNOSIS — Z951 Presence of aortocoronary bypass graft: Secondary | ICD-10-CM | POA: Insufficient documentation

## 2013-07-08 DIAGNOSIS — C159 Malignant neoplasm of esophagus, unspecified: Secondary | ICD-10-CM

## 2013-07-08 DIAGNOSIS — R1314 Dysphagia, pharyngoesophageal phase: Secondary | ICD-10-CM

## 2013-07-08 DIAGNOSIS — R634 Abnormal weight loss: Secondary | ICD-10-CM

## 2013-07-08 DIAGNOSIS — C7B8 Other secondary neuroendocrine tumors: Secondary | ICD-10-CM | POA: Insufficient documentation

## 2013-07-08 DIAGNOSIS — Z794 Long term (current) use of insulin: Secondary | ICD-10-CM | POA: Insufficient documentation

## 2013-07-08 DIAGNOSIS — E1142 Type 2 diabetes mellitus with diabetic polyneuropathy: Secondary | ICD-10-CM | POA: Insufficient documentation

## 2013-07-08 DIAGNOSIS — C7A00 Malignant carcinoid tumor of unspecified site: Secondary | ICD-10-CM | POA: Insufficient documentation

## 2013-07-08 DIAGNOSIS — R1310 Dysphagia, unspecified: Secondary | ICD-10-CM | POA: Insufficient documentation

## 2013-07-08 DIAGNOSIS — Z7982 Long term (current) use of aspirin: Secondary | ICD-10-CM | POA: Insufficient documentation

## 2013-07-08 DIAGNOSIS — E1149 Type 2 diabetes mellitus with other diabetic neurological complication: Secondary | ICD-10-CM | POA: Insufficient documentation

## 2013-07-08 DIAGNOSIS — I2510 Atherosclerotic heart disease of native coronary artery without angina pectoris: Secondary | ICD-10-CM | POA: Insufficient documentation

## 2013-07-08 HISTORY — PX: ESOPHAGOGASTRODUODENOSCOPY: SHX5428

## 2013-07-08 LAB — GLUCOSE, CAPILLARY: Glucose-Capillary: 134 mg/dL — ABNORMAL HIGH (ref 70–99)

## 2013-07-08 SURGERY — EGD (ESOPHAGOGASTRODUODENOSCOPY)
Anesthesia: Moderate Sedation

## 2013-07-08 MED ORDER — SODIUM CHLORIDE 0.9 % IV SOLN: INTRAVENOUS | Status: DC

## 2013-07-08 MED ORDER — BUTAMBEN-TETRACAINE-BENZOCAINE 2-2-14 % EX AERO
INHALATION_SPRAY | CUTANEOUS | Status: DC | PRN
  Administered 2013-07-08: 2 via TOPICAL

## 2013-07-08 MED ORDER — FENTANYL CITRATE 0.05 MG/ML IJ SOLN
INTRAMUSCULAR | Status: AC
  Filled 2013-07-08: qty 2

## 2013-07-08 MED ORDER — MIDAZOLAM HCL 10 MG/2ML IJ SOLN
INTRAMUSCULAR | Status: AC
  Filled 2013-07-08: qty 2

## 2013-07-08 MED ORDER — FENTANYL CITRATE 0.05 MG/ML IJ SOLN
INTRAMUSCULAR | Status: DC | PRN
  Administered 2013-07-08 (×2): 25 ug via INTRAVENOUS

## 2013-07-08 MED ORDER — MIDAZOLAM HCL 10 MG/2ML IJ SOLN
INTRAMUSCULAR | Status: DC | PRN
  Administered 2013-07-08: 1 mg via INTRAVENOUS
  Administered 2013-07-08: 2 mg via INTRAVENOUS
  Administered 2013-07-08 (×2): 1 mg via INTRAVENOUS

## 2013-07-08 NOTE — Interval H&P Note (Signed)
History and Physical Interval Note:  07/08/2013 12:32 PM  Billy Bush  has presented today for surgery, with the diagnosis of Weight loss [783.21] Nausea [787.02] Dysphagia [787.20]  The various methods of treatment have been discussed with the patient and family. After consideration of risks, benefits and other options for treatment, the patient has consented to  Procedure(s): ESOPHAGOGASTRODUODENOSCOPY (EGD) with possible Dilation. (N/A) as a surgical intervention .  The patient's history has been reviewed, patient examined, no change in status, stable for surgery.  I have reviewed the patient's chart and labs.  Questions were answered to the patient's satisfaction.     The recent H&P (dated *06/12/13**) was reviewed, the patient was examined and there is no change in the patients condition since that H&P was completed.   [name]  07/08/2013, 12:32 PM   [name]

## 2013-07-08 NOTE — Telephone Encounter (Signed)
   Louis Meckel, MD ','<More Detail >>       Louis Meckel, MD        Sent: Tue July 08, 2013 2:08 PM     To: Lily Lovings, RN                          Message    Pura Spice    ----- Message -----   From: Lily Lovings, RN   Sent: 07/08/2013 1:14 PM   To: Louis Meckel, MD      What is the patients name?         ----- Message -----   From: Louis Meckel, MD   Sent: 07/08/2013 12:57 PM   To: Lily Lovings, RN      This patient has advanced esophageal CA. Please schedule CT of the chest, abdomen, and pelvis and OV with Dr. Mancel Bale    Referral made to Dr. Truett Perna at [facility]. Pt scheduled for CT of CAP at [facility] CT 07/11/13. Pt to be NPO after 7am, drink 1st bottle of contrast at 8am, second bottle of contrast at 9am. Pt to arrive there at 10:45am and CT at 11am. Pt aware of appt dates and times.

## 2013-07-08 NOTE — H&P (View-Only) (Signed)
Reviewed and agree with management. Isela Stantz D. Rozlyn Yerby, M.D., FACG  

## 2013-07-08 NOTE — Telephone Encounter (Signed)
PT SCHEDULED PER GINA D.  °

## 2013-07-08 NOTE — Op Note (Signed)
[facility] [address] [place], [address]   ENDOSCOPY PROCEDURE REPORT  PATIENT: Billy Bush, Billy Bush  MR#: [id number] BIRTHDATE: 10-05-1941 , 71  yrs. old GENDER: Male ENDOSCOPIST: Louis Meckel, MD REFERRED BY:  Minus Breeding, M.D. , Mancel Bale, MD PROCEDURE DATE:  07/08/2013 PROCEDURE:  EGD w/ biopsy ASA CLASS:     Class III INDICATIONS:  Dysphagia. MEDICATIONS: These medications were titrated to patient response per physician's verbal order, Versed 5 mg IV, Propofol (Diprivan), and Fentanyl 50 mcg IV TOPICAL ANESTHETIC: Cetacaine Spray  DESCRIPTION OF PROCEDURE: After the risks benefits and alternatives of the procedure were thoroughly explained, informed consent was obtained.  The Pentax Gastroscope [id number] endoscope was introduced through the mouth and advanced to the third portion of the duodenum. Without limitations.  The instrument was slowly withdrawn as the mucosa was fully examined.      Beginning at 35 cm from the incisors there was an exophytic, friable, partially circumferential mass that extended 10 cm to the GE junction.  The 9.45mm gastroscope passed with mild resistance. Multiple biopsies were taken.   The remainder of the upper endoscopy exam was otherwise normal.  Retroflexed views revealed no abnormalities.     The scope was then withdrawn from the patient and the procedure completed.  COMPLICATIONS: There were no complications. ENDOSCOPIC IMPRESSION: 1.   esophageal carcinoma  RECOMMENDATIONS: staging CT of the abdomen, pelvis, and chest Await biopsy report REPEAT EXAM:  eSigned:  Louis Meckel, MD 07/08/2013 12:55 PM   CC:

## 2013-07-09 ENCOUNTER — Telehealth: Payer: Self-pay | Admitting: *Deleted

## 2013-07-09 ENCOUNTER — Encounter (HOSPITAL_COMMUNITY): Payer: Self-pay | Admitting: Gastroenterology

## 2013-07-09 NOTE — Telephone Encounter (Signed)
Spoke with patient and wife by phone.  Confirmed appointments with Dr. Mitzi Hansen and Dr. Truett Perna.  Contact names and phone numbers provided.

## 2013-07-10 NOTE — Telephone Encounter (Signed)
Pt scheduled to see Dr. Truett Perna 07/21/13@2pm .

## 2013-07-11 ENCOUNTER — Ambulatory Visit (INDEPENDENT_AMBULATORY_CARE_PROVIDER_SITE_OTHER)
Admission: RE | Admit: 2013-07-11 | Discharge: 2013-07-11 | Disposition: A | Discharge status: Home / Self Care | Payer: Medicare Other | Source: Ambulatory Visit | Attending: Gastroenterology | Admitting: Gastroenterology

## 2013-07-11 DIAGNOSIS — C159 Malignant neoplasm of esophagus, unspecified: Secondary | ICD-10-CM

## 2013-07-11 MED ORDER — IOHEXOL 300 MG/ML  SOLN
100.0000 mL | Freq: Once | INTRAMUSCULAR | Status: AC | PRN
  Administered 2013-07-11: 100 mL via INTRAVENOUS

## 2013-07-14 ENCOUNTER — Telehealth: Payer: Self-pay | Admitting: *Deleted

## 2013-07-14 ENCOUNTER — Telehealth: Payer: Self-pay | Admitting: Gastroenterology

## 2013-07-14 NOTE — Telephone Encounter (Signed)
Message copied by Tarri Fuller on Mon Jul 14, 2013 10:34 AM ------      Message from: [place], [place] T      Created: Sat Jul 12, 2013 10:57 PM       Potassium and kidney function ok      Continue with current treatment plan.      Tereso Newcomer, PA-C        07/12/2013 10:57 PM ------

## 2013-07-14 NOTE — Telephone Encounter (Signed)
lmom labs ok, no changes to be made 

## 2013-07-14 NOTE — Telephone Encounter (Signed)
i spoke with his wife about the CT scan reports.  They understand there are lymphnodes near the tumor that are also probably involved with cancer.

## 2013-07-14 NOTE — Telephone Encounter (Signed)
Kaplan pt, mass found on EGD last week. CT of CAP was done. Pts wife is calling for the results. Dr. Christella Hartigan as doc of the day please advise.

## 2013-07-15 ENCOUNTER — Encounter: Payer: Self-pay | Admitting: Radiation Oncology

## 2013-07-15 NOTE — Progress Notes (Signed)
GI Location of Tumor / Histology: esophageal carcinoma  Patient presented with a five month history of dysphagia, primarily with bread and meat.  Biopsies of esophagus (if applicable) revealed: Esophagus, biopsy - HIGH GRADE NEUROENDOCRINE CARCINOMA.  Past/Anticipated interventions by surgeon, if any: Per Dr. Arlyce Dice, did not think surgery is an option.  Past/Anticipated interventions by medical oncology, if any: Appointment with Dr. Truett Perna on 07/21/2013.  Weight changes, if any: 15 lb weight loss in last 6 months  Bowel/Bladder complaints, if any: no  Nausea / Vomiting, if any: feels like he is going to vomit all the time.  He feels like he is going to gag.  Pain issues, if any:  no  SAFETY ISSUES:  Prior radiation? no  Pacemaker/ICD? Yes placed by Dr. Ladona Ridgel  Possible current pregnancy? no  Is the patient on methotrexate? no  Current Complaints / other details:  Billy Bush here with his wife and 2 sons for consult.  He denies pain.  He states that he has felt like he is going to throw up all the time.  He is able to swallow most foods especially salty foods.  He does have trouble swallowing meats.

## 2013-07-16 ENCOUNTER — Ambulatory Visit
Admission: RE | Admit: 2013-07-16 | Discharge: 2013-07-16 | Disposition: A | Discharge status: Home / Self Care | Payer: Medicare Other | Source: Ambulatory Visit | Attending: Radiation Oncology | Admitting: Radiation Oncology

## 2013-07-16 ENCOUNTER — Encounter: Payer: Self-pay | Admitting: Radiation Oncology

## 2013-07-16 VITALS — BP 110/56 | HR 58 | Temp 98.0°F | Ht 67.0 in | Wt 172.0 lb

## 2013-07-16 DIAGNOSIS — I10 Essential (primary) hypertension: Secondary | ICD-10-CM | POA: Insufficient documentation

## 2013-07-16 DIAGNOSIS — Z951 Presence of aortocoronary bypass graft: Secondary | ICD-10-CM | POA: Insufficient documentation

## 2013-07-16 DIAGNOSIS — E11329 Type 2 diabetes mellitus with mild nonproliferative diabetic retinopathy without macular edema: Secondary | ICD-10-CM | POA: Insufficient documentation

## 2013-07-16 DIAGNOSIS — Z87891 Personal history of nicotine dependence: Secondary | ICD-10-CM | POA: Insufficient documentation

## 2013-07-16 DIAGNOSIS — Z794 Long term (current) use of insulin: Secondary | ICD-10-CM | POA: Insufficient documentation

## 2013-07-16 DIAGNOSIS — Z7982 Long term (current) use of aspirin: Secondary | ICD-10-CM | POA: Insufficient documentation

## 2013-07-16 DIAGNOSIS — E785 Hyperlipidemia, unspecified: Secondary | ICD-10-CM | POA: Insufficient documentation

## 2013-07-16 DIAGNOSIS — C159 Malignant neoplasm of esophagus, unspecified: Secondary | ICD-10-CM

## 2013-07-16 DIAGNOSIS — Z79899 Other long term (current) drug therapy: Secondary | ICD-10-CM | POA: Insufficient documentation

## 2013-07-16 DIAGNOSIS — C155 Malignant neoplasm of lower third of esophagus: Secondary | ICD-10-CM

## 2013-07-16 DIAGNOSIS — I2510 Atherosclerotic heart disease of native coronary artery without angina pectoris: Secondary | ICD-10-CM | POA: Insufficient documentation

## 2013-07-16 DIAGNOSIS — E1139 Type 2 diabetes mellitus with other diabetic ophthalmic complication: Secondary | ICD-10-CM | POA: Insufficient documentation

## 2013-07-16 HISTORY — DX: Malignant neoplasm of esophagus, unspecified: C15.9

## 2013-07-16 HISTORY — DX: Elevated prostate specific antigen (PSA): R97.20

## 2013-07-16 MED ORDER — LORAZEPAM 1 MG PO TABS: 0.5000 mg | ORAL_TABLET | Freq: Four times a day (QID) | ORAL | Status: DC | PRN

## 2013-07-16 NOTE — Progress Notes (Signed)
Please see the Nurse Progress Note in the MD Initial Consult Encounter for this patient. 

## 2013-07-17 ENCOUNTER — Telehealth: Payer: Self-pay | Admitting: *Deleted

## 2013-07-17 DIAGNOSIS — C155 Malignant neoplasm of lower third of esophagus: Secondary | ICD-10-CM | POA: Insufficient documentation

## 2013-07-17 NOTE — Telephone Encounter (Signed)
CALLED PATIENT TO INFORM OF TEST FOR 07-24-13, SPOKE WITH PATIENT 'S WIFE AND THEY SHE IS AWARE OF THIS TEST.

## 2013-07-17 NOTE — Progress Notes (Signed)
Radiation Oncology         [phone] ________________________________  Name: Billy Bush MRN: [id number]  Date: 07/16/2013  DOB: 1941/02/26  WU:[id number], Billy Signs, MD  Ladene Artist, MD     REFERRING PHYSICIAN: Ladene Artist, MD   DIAGNOSIS: The encounter diagnosis was Esophageal carcinoma.   HISTORY OF PRESENT ILLNESS::[name] is a 72 y.o. male who is seen for an initial consultation visit. The patient indicates that he has had a history of some dysphasia for approximately 5 months. This has been most notable with bread and meat. The patient however has continued to be able to repeat a wide range of foods although he does need to chew foods such as meats more carefully than in the past. The patient has also had some nausea which has developed over this time period. This has been an issue he states prior to daily even more than after eating. The patient states that he has lost approximately 15 pounds over the last 6 months.   These symptoms prompted further workup including an upper endoscopy. An exophytic, friable mass was seen that extended 10 cm to the GE junction beginning at 35 mm. Biopsies were taken and these returned positive for high grade neuroendocrine carcinoma. 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  The patient has undergone further workup including a CT scan of the chest, abdomen and pelvis. Within the chest a distal esophageal mass was seen compatible with a primary esophageal neoplasm. The tumor extends to the GE junction. Upper abdominal/retroperitoneal lymphadenopathy was also seen. The largest measured 3.8 cm in the gastrohepatic region. No evidence of further distant disease was  Seen.  The patient presents today for evaluation for this recent diagnosis.   PREVIOUS RADIATION THERAPY: No   PAST MEDICAL HISTORY:  has a past medical history of Dyslipidemia;  Hypertension; History of colonic polyps; Thrombocytopenia; Nonproliferative diabetic retinopathy NOS(362.03); Impotence of organic origin; Iron deficiency anemia, unspecified; Unspecified hereditary and idiopathic peripheral neuropathy; Diabetes mellitus; Depressive disorder, not elsewhere classified; Coronary artery disease; Chronic systolic heart failure; Anxiety state, unspecified; Ischemic cardiomyopathy; S/P ICD (internal cardiac defibrillator) procedure; Esophageal cancer; and Elevated PSA.     PAST SURGICAL HISTORY: Past Surgical History  Procedure Laterality Date  . Coronary artery bypass graft      X 3  . Cardiac catheterization  05/23/07  . Insert / replace / remove pacemaker  07/05/07    [employer] SINGLE-CHAMBER DEFIBRILLATOR, [name], MD  . Esophagogastroduodenoscopy N/A 07/08/2013    Procedure: ESOPHAGOGASTRODUODENOSCOPY (EGD) with possible Dilation.;  Surgeon: Louis Meckel, MD;  Location: [facility] ENDOSCOPY;  Service: Endoscopy;  Laterality: N/A;     FAMILY HISTORY: family history is not on file.   SOCIAL HISTORY:  reports that he quit smoking about 18 years ago. His smoking use included Cigarettes. He smoked 0.00 packs per day for 2 years. He has never used smokeless tobacco. He reports that he does not drink alcohol or use illicit drugs.   ALLERGIES: Review of patient's allergies indicates no known allergies.   MEDICATIONS:  Current Outpatient Prescriptions  Medication Sig Dispense Refill  . aspirin 325 MG EC tablet Take 325 mg by mouth daily.        Marland Kitchen atorvastatin (LIPITOR) 80 MG tablet TAKE 1 TABLET AT BEDTIME  90 tablet  0  . carvedilol (COREG) 12.5 MG tablet 2 (two) times daily with a meal.       .  citalopram (CELEXA) 10 MG tablet Take 1 tablet (10 mg total) by mouth daily.  90 tablet  2  . clopidogrel (PLAVIX) 75 MG tablet TAKE 1 TABLET BY MOUTH ONCE DAILY  90 tablet  2  . co-enzyme Q-10 30 MG capsule Take 30 mg by mouth daily.        . furosemide (LASIX) 80 MG  tablet       . insulin lispro (HUMALOG) 100 UNIT/ML injection Inject into the skin 3x a day (just before each meal) 11-04-27 units  15 mL  2  . Insulin Pen Needle (BD PEN NEEDLE NANO U/F) 32G X 4 MM MISC Use as directed four times daily  400 each  3  . Multiple Vitamin (MULTIVITAMIN) tablet Take 1 tablet by mouth daily.        . nitroGLYCERIN (NITROSTAT) 0.4 MG SL tablet Place 1 tablet (0.4 mg total) under the tongue every 5 (five) minutes as needed for chest pain.  25 tablet  3  . ramipril (ALTACE) 5 MG capsule TAKE 1 CAPSULE BY MOUTH DAILY  90 capsule  3  . spironolactone (ALDACTONE) 25 MG tablet 1/2 tablet daily      . glucose blood (ONE TOUCH ULTRA TEST) test strip Use as directed twice daily dx 250.01  200 each  3  . LORazepam (ATIVAN) 1 MG tablet Take 0.5 tablets (0.5 mg total) by mouth every 6 (six) hours as needed (nausea).  30 tablet  0  . ondansetron (ZOFRAN) 4 MG tablet Take 1 tab 30 min before meals.  60 tablet  1  . pantoprazole (PROTONIX) 40 MG tablet Take 1 tablet (40 mg total) by mouth daily.  90 tablet  3   No current facility-administered medications for this encounter.     REVIEW OF SYSTEMS:  A 15 point review of systems is documented in the electronic medical record. This was obtained by the nursing staff. However, I reviewed this with the patient to discuss relevant findings and make appropriate changes.  Pertinent items are noted in HPI.    PHYSICAL EXAM:  height is 5\' 7"  (1.702 m) and weight is 172 lb (78.019 kg). His temperature is 98 F (36.7 C). His blood pressure is 110/56 and his pulse is 58.   General: Well-developed, in no acute distress,  HEENT: Normocephalic, atraumatic; oral cavity clear Neck: Supple without any lymphadenopathy Cardiovascular: Regular rate and rhythm Respiratory: Clear to auscultation bilaterally GI: Soft, nontender, normal bowel sounds Extremities: No edema present Neuro: No focal deficits     LABORATORY DATA:  Lab Results    Component Value Date   WBC 6.4 01/03/2013   HGB 11.3* 01/03/2013   HCT 33.2* 01/03/2013   MCV 92.2 01/03/2013   PLT 157.0 01/03/2013   Lab Results  Component Value Date   NA 135 07/04/2013   K 4.7 07/04/2013   CL 104 07/04/2013   CO2 25 07/04/2013   Lab Results  Component Value Date   ALT 14 01/03/2013   AST 19 01/03/2013   ALKPHOS 61 01/03/2013   BILITOT 1.0 01/03/2013      RADIOGRAPHY: Ct Chest W Contrast  07/11/2013   *RADIOLOGY REPORT*  Clinical Data:  Esophageal mass, evaluate for metastases  CT CHEST, ABDOMEN AND PELVIS WITHOUT CONTRAST  Technique:  Multidetector CT imaging of the chest, abdomen and pelvis was performed following the standard protocol without IV contrast.  Comparison:  None.  CT CHEST  Findings:  7 mm nodule in the medial right upper lobe (  series 4/image 28).  Adjacent minimal nodularity (series 4/image 27). Additional minimal nodularity/scarring in the right middle lobe and lingula.  Trace bilateral pleural effusions.  No pneumothorax.  1.8 cm right thyroid nodule (series 2/image 4).  The heart is normal in size.  No pericardial effusion.  Coronary atherosclerosis. Postsurgical changes related to prior CABG. Atherosclerotic calcifications of the aortic arch.  Left subclavian ICD.  9 mm short-axis right paratracheal node (series 2/image 22), within normal limits.  No suspicious hilar or axillary lymphadenopathy.  Concentric distal esophageal mass, measuring up to 2.2 cm in thickness (series 2/image 44), compatible with primary esophageal neoplasm.  Tumor extends to the GE junction but does not definitely involve the gastric cardia (series 2/image 48).  Degenerative changes of the thoracic spine.  IMPRESSION: Distal esophageal mass, compatible with primary esophageal neoplasm, as described above.  Tumor extends to the GE junction but does not definitely involve the gastric cardia.  7 mm nodule in the medial right lower lobe with adjacent minimal nodularity, indeterminate, possibly  infectious/inflammatory. Attention on follow-up is suggested.  Trace bilateral pleural effusions.  CT ABDOMEN AND PELVIS  Findings:  Liver, spleen, pancreas, and adrenal glands are within normal limits.  Layering gallstones (series 2/image 61), without associated inflammatory changes.  No intrahepatic or extrahepatic ductal dilatation.  Suspected vascular calcifications in the right kidney (series 2/images 63 and 68).  Kidneys are otherwise unremarkable.  No hydronephrosis.  No evidence of bowel obstruction.  Normal appendix.  Atherosclerotic calcifications of the abdominal aorta and branch vessels.  No abdominopelvic ascites.  Upper abdominal/retroperitoneal lymphadenopathy, suspicious for nodal metastases, including: --2.2 x 2.6 cm gastrohepatic node (series 2/image 52) --3.8 x 3.2 cm gastrohepatic node (series 2/image 55) --1.8 x 1.3 cm left celiac axis node (series 2/image 57) --1.1 x 0.7 cm left para-aortic node (series 2/image 62)  Prostate is notable for mild enlargement of the central gland which indents the base of bladder (series 2/image 116).  Bladder is within normal limits.  Degenerative changes of the lumbar spine.  IMPRESSION: Upper abdominal/retroperitoneal lymphadenopathy, as described above, suspicious for nodal metastases.  Cholelithiasis, without associated inflammatory changes.   Original Report Authenticated By: Charline Bills, M.D.   Ct Abdomen Pelvis W Contrast  07/11/2013   *RADIOLOGY REPORT*  Clinical Data:  Esophageal mass, evaluate for metastases  CT CHEST, ABDOMEN AND PELVIS WITHOUT CONTRAST  Technique:  Multidetector CT imaging of the chest, abdomen and pelvis was performed following the standard protocol without IV contrast.  Comparison:  None.  CT CHEST  Findings:  7 mm nodule in the medial right upper lobe (series 4/image 28).  Adjacent minimal nodularity (series 4/image 27). Additional minimal nodularity/scarring in the right middle lobe and lingula.  Trace bilateral pleural  effusions.  No pneumothorax.  1.8 cm right thyroid nodule (series 2/image 4).  The heart is normal in size.  No pericardial effusion.  Coronary atherosclerosis. Postsurgical changes related to prior CABG. Atherosclerotic calcifications of the aortic arch.  Left subclavian ICD.  9 mm short-axis right paratracheal node (series 2/image 22), within normal limits.  No suspicious hilar or axillary lymphadenopathy.  Concentric distal esophageal mass, measuring up to 2.2 cm in thickness (series 2/image 44), compatible with primary esophageal neoplasm.  Tumor extends to the GE junction but does not definitely involve the gastric cardia (series 2/image 48).  Degenerative changes of the thoracic spine.  IMPRESSION: Distal esophageal mass, compatible with primary esophageal neoplasm, as described above.  Tumor extends to the GE junction but does  not definitely involve the gastric cardia.  7 mm nodule in the medial right lower lobe with adjacent minimal nodularity, indeterminate, possibly infectious/inflammatory. Attention on follow-up is suggested.  Trace bilateral pleural effusions.  CT ABDOMEN AND PELVIS  Findings:  Liver, spleen, pancreas, and adrenal glands are within normal limits.  Layering gallstones (series 2/image 61), without associated inflammatory changes.  No intrahepatic or extrahepatic ductal dilatation.  Suspected vascular calcifications in the right kidney (series 2/images 63 and 68).  Kidneys are otherwise unremarkable.  No hydronephrosis.  No evidence of bowel obstruction.  Normal appendix.  Atherosclerotic calcifications of the abdominal aorta and branch vessels.  No abdominopelvic ascites.  Upper abdominal/retroperitoneal lymphadenopathy, suspicious for nodal metastases, including: --2.2 x 2.6 cm gastrohepatic node (series 2/image 52) --3.8 x 3.2 cm gastrohepatic node (series 2/image 55) --1.8 x 1.3 cm left celiac axis node (series 2/image 57) --1.1 x 0.7 cm left para-aortic node (series 2/image 62)   Prostate is notable for mild enlargement of the central gland which indents the base of bladder (series 2/image 116).  Bladder is within normal limits.  Degenerative changes of the lumbar spine.  IMPRESSION: Upper abdominal/retroperitoneal lymphadenopathy, as described above, suspicious for nodal metastases.  Cholelithiasis, without associated inflammatory changes.   Original Report Authenticated By: Charline Bills, M.D.       IMPRESSION: The patient has a recent diagnosis of esophageal cancer involving the distal esophagus. The pathology returned positive for a high-grade neuroendocrine carcinoma.  In terms of further workup, I have ordered a PET scan for further staging. The patient did have significant regional lymphadenopathy seen on his CT imaging.  I believe that the patient would likely benefit from an initial course of chemoradiotherapy. The patient does have some significant comorbidities including cardiac issues in the not sure that subsequent surgical resection will be a viable option for him. I discussed with the patient that this is a very individualized decision depending on the initial presentation, the response to treatment, as well as the patient's health status. The patient can be further discussed I believe at GI conference and surgical referral can be made if necessary.  The patient I believe therefore initially would benefit from chemoradiotherapy based on the current information. He is scheduled to be seen by Dr. Truett Perna next week. I would anticipate a 5-1/2-6 week course of treatment. We discussed the expected benefit of such a treatment as well as the possible side effects and risks as well. All of his questions were answered and he was accompanied by multiple family members today.   PLAN: We will proceed with a PET scan and I will go ahead and also order a CT simulation in anticipation of radiation treatment. He will see Dr. Truett Perna next week and we will also discussed his  case at multidisciplinary GI conference    I spent 60 minutes minutes face to face with the patient and more than 50% of that time was spent in counseling and/or coordination of care.    ________________________________   Radene Gunning, MD, PhD

## 2013-07-17 NOTE — Telephone Encounter (Signed)
CALLED PATIENT TO INFORM OF TEST BEING MOVED TO 07-23-13 AT 12 PM, SPOKE WITH PATIENT'S WIFE AND SHE IS AWARE OF THIS TEST

## 2013-07-21 ENCOUNTER — Ambulatory Visit (HOSPITAL_BASED_OUTPATIENT_CLINIC_OR_DEPARTMENT_OTHER): Payer: Medicare Other | Admitting: Oncology

## 2013-07-21 ENCOUNTER — Encounter: Payer: Self-pay | Admitting: Oncology

## 2013-07-21 ENCOUNTER — Telehealth: Payer: Self-pay

## 2013-07-21 ENCOUNTER — Ambulatory Visit: Payer: Medicare Other

## 2013-07-21 VITALS — BP 119/68 | HR 61 | Temp 97.1°F | Resp 18 | Ht 67.0 in | Wt 169.0 lb

## 2013-07-21 DIAGNOSIS — C159 Malignant neoplasm of esophagus, unspecified: Secondary | ICD-10-CM

## 2013-07-21 NOTE — Progress Notes (Signed)
Met with Pura Spice and family. Explained role of nurse navigator. Educational information provided on esophageal cancer along with [facility] resource sheet and phone numbers.Referral made to dietician for diet education. [facility] resources provided to patient, including SW service information.  Contact names and phone numbers were provided.  No barriers to care identified.  Will continue to follow as needed.

## 2013-07-21 NOTE — Progress Notes (Signed)
[facility] New Patient Consult   Referring MD: Kendarious Gudino 72 y.o.  Jan 10, 1941    Reason for Referral: Esophagus cancer     HPI: He saw Dr. Arlyce Dice on 06/12/2013 with a complaint of dysphagia. He also reported nausea, but no vomiting. He was taken for an upper endoscopy on 07/08/2013. Beginning at 35 cm from the incisors a mass was noted that extended 10 cm to the GE junction. Multiple biopsies were obtained. The remainder of the endoscopy was normal.  The pathology ([id number]) revealed a high-grade neuroendocrine carcinoma. She malignant cells with a high N./C. ratio were identified. The cells were strongly positive for CDX-2 and negative for TTF-1. The cells were positive for synaptophysin and chromogranin. 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  CTs of the chest, abdomen, and pelvis on 07/11/2013 revealed a distal esophagus mass with tumor extending to the GE junction. A 7 mm nodule in the medial right lower lobe with minimal adjacent nodularity, possibly infectious/inflammatory. The liver and adrenal glands appeared normal.  No ascites. upper abdominal/retroperitoneal lymphadenopathy suspicious for nodal metastases. Mildly enlarged prostate.   He saw Dr. Mitzi Hansen and is scheduled to undergo radiation simulation on Apr 24 2013.  Past Medical History  Diagnosis Date  . Dyslipidemia   . Hypertension   . History of colonic polyps   . Thrombocytopenia   . Nonproliferative diabetic retinopathy NOS(362.03)   . Impotence of organic origin   . Iron deficiency anemia, unspecified   . Unspecified hereditary and idiopathic peripheral neuropathy   . Diabetes mellitus   . Depressive disorder, not elsewhere classified   . Coronary artery disease     a. s/p CABG 1997; b. LHC 04/2007: Distal left main 80% treated with PCI (Cypher DES), LIMA-LAD patent, SVG-OM patent,  SVG-PDA patent, EF 30% with inferior HK  . Chronic systolic heart failure     a. Echo 09/2011: EF 30-35%, apical AK, basal inferior AK, mild AI, moderate MR, moderate LAE  . Anxiety state, unspecified   . Ischemic cardiomyopathy   . S/P ICD (internal cardiac defibrillator) procedure   . Esophageal cancer  07/08/2013   . Elevated PSA     Past Surgical History  Procedure Laterality Date  . Coronary artery bypass graft   1997     X 3  . Cardiac catheterization  05/23/07  . Insert / replace / remove pacemaker  07/05/07    [employer]DE SINGLE-CHAMBER DEFIBRILLATOR, [name], MD  . Esophagogastroduodenoscopy N/A 07/08/2013    Procedure: ESOPHAGOGASTRODUODENOSCOPY (EGD) with possible Dilation.;  Surgeon: Louis Meckel, MD;  Location: [facility];  Service: Endoscopy;  Laterality: N/A;    Family history: One brother, no family history of cancer   Current outpatient prescriptions:aspirin 325 MG EC tablet, Take 325 mg by mouth daily.  , Disp: , Rfl: ;  atorvastatin (LIPITOR) 80 MG tablet, TAKE 1 TABLET AT BEDTIME, Disp: 90 tablet, Rfl: 0;  carvedilol (COREG) 12.5 MG tablet, 2 (two) times daily with a meal. , Disp: , Rfl: ;  citalopram (CELEXA) 10 MG tablet, Take 1 tablet (10 mg total) by mouth daily., Disp: 90 tablet, Rfl: 2 clopidogrel (PLAVIX) 75 MG tablet, TAKE 1 TABLET BY MOUTH ONCE DAILY, Disp: 90 tablet, Rfl: 2;  co-enzyme Q-10 30 MG capsule, Take 30 mg by mouth daily.  , Disp: , Rfl: ;  furosemide (LASIX) 80 MG tablet, Take 80 mg by mouth daily. ,  Disp: , Rfl: ;  glucose blood (ONE TOUCH ULTRA TEST) test strip, Use as directed twice daily dx 250.01, Disp: 200 each, Rfl: 3 insulin lispro (HUMALOG) 100 UNIT/ML injection, Inject into the skin 3x a day (just before each meal) 11-04-27 units, Disp: 15 mL, Rfl: 2;  Insulin Pen Needle (BD PEN NEEDLE NANO U/F) 32G X 4 MM MISC, Use as directed four times daily, Disp: 400 each, Rfl: 3;  Multiple Vitamin (MULTIVITAMIN) tablet, Take 1 tablet by mouth daily.   , Disp: , Rfl:  pantoprazole (PROTONIX) 40 MG tablet, Take 1 tablet (40 mg total) by mouth daily., Disp: 90 tablet, Rfl: 3;  ramipril (ALTACE) 5 MG capsule, TAKE 1 CAPSULE BY MOUTH DAILY, Disp: 90 capsule, Rfl: 3;  spironolactone (ALDACTONE) 25 MG tablet, 1/2 tablet daily, Disp: , Rfl: ;  LORazepam (ATIVAN) 1 MG tablet, Take 0.5 tablets (0.5 mg total) by mouth every 6 (six) hours as needed (nausea)., Disp: 30 tablet, Rfl: 0 nitroGLYCERIN (NITROSTAT) 0.4 MG SL tablet, Place 1 tablet (0.4 mg total) under the tongue every 5 (five) minutes as needed for chest pain., Disp: 25 tablet, Rfl: 3;  ondansetron (ZOFRAN) 4 MG tablet, Take 1 tab 30 min before meals., Disp: 60 tablet, Rfl: 1  Allergies: No Known Allergies  Social History: He lives in [place]. He is a retired Optician, dispensing. He does not use cigarettes or alcohol. He quit smoking cigarettes many years ago. He was transfused with the bypass surgery in 1997. No risk factor for hepatitis.   ROS:   Positives include: 15 pound weight loss, nausea, hiccups after taking Ativan, solid dysphagia  A complete ROS was otherwise negative.  Physical Exam:  Blood pressure 119/68, pulse 61, temperature 97.1 F (36.2 C), temperature source Oral, resp. rate 18, height 5\' 7"  (1.702 m), weight 169 lb (76.658 kg).  HEENT: Upper denture plate, oropharynx without visible mass, neck without mass Lungs: Distant breath sounds, no respiratory distress Cardiac: Regular rate and rhythm Abdomen: No hepatosplenomegaly, nontender, no mass GU: Testes without mass  Vascular: No leg edema Lymph nodes: No cervical, supra-clavicular, axillary, or inguinal nodes Neurologic: Alert and oriented, the motor exam appears intact in the upper and lower extremity Skin: Purpura over the dorsum of the hand/arms. Brown discoloration at the pretibial area bilaterally Musculoskeletal: No spine tenderness   LAB:   CMP      Component Value Date/Time   NA 135 07/04/2013 0932   K 4.7  07/04/2013 0932   CL 104 07/04/2013 0932   CO2 25 07/04/2013 0932   GLUCOSE 134* 07/04/2013 0932   GLUCOSE 186* 10/29/2006 0755   BUN 14 07/04/2013 0932   CREATININE 1.0 07/04/2013 0932   CALCIUM 8.9 07/04/2013 0932   PROT 6.3 01/03/2013 0859   ALBUMIN 4.2 01/03/2013 0859   AST 19 01/03/2013 0859   ALT 14 01/03/2013 0859   ALKPHOS 61 01/03/2013 0859   BILITOT 1.0 01/03/2013 0859   GFRNONAA 71.13 12/14/2009 0850   GFRAA 96 12/07/2008 1005     Radiology: As per history of present illness    Assessment/Plan:   1. High-grade neuroendocrine carcinoma of the esophagus, clinical stage III 2. Dysphagia secondary to #1 3. History of coronary artery disease 4. Congestive heart failure 5. Diabetes   Disposition:   Mr. Mould has been diagnosed with locally advanced esophagus cancer. I discussed the diagnosis and treatment options with Mr. Sweetin and his family. We discussed the uncommon tumor histology. His case will be presented at the GI tumor  conference on  07/23/2013 for review of the histology and treatment recommendations. He does not appear to be a surgical candidate based on comorbid conditions.  My initial impression is recommend concurrent chemotherapy and radiation. If he is proven to have distant metastatic disease on the staging PET scan then we can consider a palliative dose of radiation.  I recommend Taxol/carboplatin to be given on a weekly schedule concurrent with radiation. We reviewed the potential toxicities associated with this regimen including the chance for nausea/vomiting, alopecia, mucositis, diarrhea, and hematologic toxicity. We discussed the neuropathy, allergic reaction, and bone pain associated with Taxol. We discussed the chance of an allergic reaction with carboplatin.  We will followup on the staging PET scan. A first treatment with weekly Taxol/carboplatin will be scheduled for 08/07/2013. Mr. Elzey will attend a chemotherapy teaching class and meet with the cancer Center  nutritionist prior to beginning chemotherapy.    [place] 07/21/2013, 6:48 PM

## 2013-07-21 NOTE — Progress Notes (Signed)
Reports persistent sensation of needing to vomit.

## 2013-07-21 NOTE — Telephone Encounter (Signed)
gv and printed appt sched and avs for pt...emailed MW to add tx... °

## 2013-07-21 NOTE — Progress Notes (Signed)
Checked in new pt with no financial concerns. °

## 2013-07-22 ENCOUNTER — Encounter: Payer: Self-pay | Admitting: Internal Medicine

## 2013-07-22 ENCOUNTER — Ambulatory Visit (INDEPENDENT_AMBULATORY_CARE_PROVIDER_SITE_OTHER): Payer: Medicare Other | Admitting: Internal Medicine

## 2013-07-22 ENCOUNTER — Telehealth: Payer: Self-pay | Admitting: *Deleted

## 2013-07-22 VITALS — BP 115/53 | HR 61 | Ht 68.0 in | Wt 169.8 lb

## 2013-07-22 DIAGNOSIS — I5022 Chronic systolic (congestive) heart failure: Secondary | ICD-10-CM

## 2013-07-22 DIAGNOSIS — Z95810 Presence of automatic (implantable) cardiac defibrillator: Secondary | ICD-10-CM

## 2013-07-22 LAB — ICD DEVICE OBSERVATION
BRDY-0002RV: 40 {beats}/min
DEV-0020ICD: NEGATIVE
DEVICE MODEL ICD: 484050
RV LEAD AMPLITUDE: 10.4 mv
TOT-0010: 38
TZAT-0001FASTVT: 1
TZAT-0013FASTVT: 2
TZAT-0018SLOWVT: NEGATIVE
TZAT-0019FASTVT: 7.5 V
TZAT-0019SLOWVT: 7.5 V
TZAT-0020FASTVT: 1 ms
TZAT-0020SLOWVT: 1 ms
TZON-0003SLOWVT: 340 ms
TZON-0005FASTVT: 6
TZON-0005SLOWVT: 6
TZON-0010SLOWVT: 80 ms
TZST-0001FASTVT: 2
TZST-0001FASTVT: 4
TZST-0001SLOWVT: 2
TZST-0001SLOWVT: 4
TZST-0003FASTVT: 36 J
TZST-0003FASTVT: 36 J
TZST-0003FASTVT: 36 J
TZST-0003SLOWVT: 36 J
TZST-0003SLOWVT: 36 J

## 2013-07-22 NOTE — Telephone Encounter (Signed)
Per staff message and POF I have scheduled appts.  JMW  

## 2013-07-22 NOTE — Assessment & Plan Note (Signed)
His [employer] Jude ICD is working normally with no sustained ventricular arrhythmias. We'll plan to recheck in several months.

## 2013-07-22 NOTE — Patient Instructions (Addendum)
Your physician recommends that you schedule a follow-up appointment in: 3 MONTHS WITH DEVICE CLINIC  Your physician wants you to follow-up in: ONE YEAR WITH DR Court Joy will receive a reminder letter in the mail two months in advance. If you don't receive a letter, please call our office to schedule the follow-up appointment.

## 2013-07-22 NOTE — Assessment & Plan Note (Signed)
His heart failure symptoms are class II. He'll continue his current medical therapy, and I've encouraged the patient to maintain a low-sodium diet.

## 2013-07-22 NOTE — Progress Notes (Signed)
HPI [name] returns today for followup. He is a very pleasant 72 year old man with an ischemic cardiomyopathy, chronic class II systolic heart failure, status post bypass surgery, status post ICD implantation. Several months ago, he began to experience difficulty swallowing, and subsequent evaluation demonstrated a neural endocrine tumor of the esophagus. He is pending radiation and chemotherapy. He denies chest pain or shortness of breath. No syncope. No peripheral edema.  No Known Allergies   Current Outpatient Prescriptions  Medication Sig Dispense Refill  . aspirin 325 MG EC tablet Take 325 mg by mouth daily.        Marland Kitchen atorvastatin (LIPITOR) 80 MG tablet TAKE 1 TABLET AT BEDTIME  90 tablet  0  . carvedilol (COREG) 12.5 MG tablet 2 (two) times daily with a meal.       . citalopram (CELEXA) 10 MG tablet Take 1 tablet (10 mg total) by mouth daily.  90 tablet  2  . clopidogrel (PLAVIX) 75 MG tablet TAKE 1 TABLET BY MOUTH ONCE DAILY  90 tablet  2  . co-enzyme Q-10 30 MG capsule Take 30 mg by mouth daily.        . furosemide (LASIX) 80 MG tablet Take 80 mg by mouth daily.       Marland Kitchen glucose blood (ONE TOUCH ULTRA TEST) test strip Use as directed twice daily dx 250.01  200 each  3  . insulin lispro (HUMALOG) 100 UNIT/ML injection Inject into the skin 3x a day (just before each meal) 11-04-27 units  15 mL  2  . Insulin Pen Needle (BD PEN NEEDLE NANO U/F) 32G X 4 MM MISC Use as directed four times daily  400 each  3  . Multiple Vitamin (MULTIVITAMIN) tablet Take 1 tablet by mouth daily.        . nitroGLYCERIN (NITROSTAT) 0.4 MG SL tablet Place 1 tablet (0.4 mg total) under the tongue every 5 (five) minutes as needed for chest pain.  25 tablet  3  . pantoprazole (PROTONIX) 40 MG tablet Take 1 tablet (40 mg total) by mouth daily.  90 tablet  3  . ramipril (ALTACE) 5 MG capsule TAKE 1 CAPSULE BY MOUTH DAILY  90 capsule  3  . spironolactone (ALDACTONE) 25 MG tablet 1/2 tablet daily       No current  facility-administered medications for this visit.     Past Medical History  Diagnosis Date  . Dyslipidemia   . Hypertension   . History of colonic polyps   . Thrombocytopenia   . Nonproliferative diabetic retinopathy NOS(362.03)   . Impotence of organic origin   . Iron deficiency anemia, unspecified   . Unspecified hereditary and idiopathic peripheral neuropathy   . Diabetes mellitus   . Depressive disorder, not elsewhere classified   . Coronary artery disease     a. s/p CABG 1997; b. LHC 04/2007: Distal left main 80% treated with PCI (Cypher DES), LIMA-LAD patent, SVG-OM patent, SVG-PDA patent, EF 30% with inferior HK  . Chronic systolic heart failure     a. Echo 09/2011: EF 30-35%, apical AK, basal inferior AK, mild AI, moderate MR, moderate LAE  . Anxiety state, unspecified   . Ischemic cardiomyopathy   . S/P ICD (internal cardiac defibrillator) procedure   . Esophageal cancer   . Elevated PSA     ROS:   All systems reviewed and negative except as noted in the HPI.   Past Surgical History  Procedure Laterality Date  . Coronary artery bypass graft  X 3  . Cardiac catheterization  05/23/07  . Insert / replace / remove pacemaker  07/05/07    [employer]. JUDE SINGLE-CHAMBER DEFIBRILLATOR, [name], MD  . Esophagogastroduodenoscopy N/A 07/08/2013    Procedure: ESOPHAGOGASTRODUODENOSCOPY (EGD) with possible Dilation.;  Surgeon: Louis Meckel, MD;  Location: [facility];  Service: Endoscopy;  Laterality: N/A;     History reviewed. No pertinent family history.   History   Social History  . Marital Status: Married    Spouse Name: N/A    Number of Children: 4  . Years of Education: N/A   Occupational History  . PASTOR    Social History Main Topics  . Smoking status: Former Smoker -- 2 years    Types: Cigarettes    Quit date: 11/27/1994  . Smokeless tobacco: Never Used  . Alcohol Use: No  . Drug Use: No  . Sexual Activity: Not on file   Other Topics Concern   . Not on file   Social History Narrative   [employer]   MARRIED   4 BOYS   FORMER SMOKER, QUIT 14 YRS   ALCOHOL USE -NO   NO ILLICIT DRUG USE         ICD-St. JUDE      PATIENT SIGNED A DESIGNATED PARTY RELEASE TO ALLOW HIS WIFE, GLENDA Woelfel, TO HAVE ACCESS TO HIS MEDICAL RECORDS/INFORMATION.    [name], March 21, 2010 9:42 AM     BP 115/53  Pulse 61  Ht 5\' 8"  (1.727 m)  Wt 169 lb 12.8 oz (77.021 kg)  BMI 25.82 kg/m2  Physical Exam:  Well appearing 72 year old man,NAD HEENT: Unremarkable Neck:  No JVD, no thyromegally Lungs:  Clear with no wheezes, rales, or rhonchi. HEART:  Regular rate rhythm, no murmurs, no rubs, no clicks Abd:  soft, positive bowel sounds, no organomegally, no rebound, no guarding Ext:  2 plus pulses, no edema, no cyanosis, no clubbing Skin:  No rashes no nodules Neuro:  CN II through XII intact, motor grossly intact   DEVICE  Normal device function.  See PaceArt for details.   Assess/Plan:

## 2013-07-23 ENCOUNTER — Telehealth: Payer: Self-pay | Admitting: Dietician

## 2013-07-23 ENCOUNTER — Other Ambulatory Visit: Payer: Medicare Other

## 2013-07-23 ENCOUNTER — Ambulatory Visit: Payer: Medicare Other | Admitting: Radiation Oncology

## 2013-07-23 ENCOUNTER — Telehealth: Payer: Self-pay | Admitting: Endocrinology

## 2013-07-23 ENCOUNTER — Other Ambulatory Visit (HOSPITAL_BASED_OUTPATIENT_CLINIC_OR_DEPARTMENT_OTHER): Payer: Medicare Other | Admitting: Lab

## 2013-07-23 ENCOUNTER — Encounter (HOSPITAL_COMMUNITY)
Admission: RE | Admit: 2013-07-23 | Discharge: 2013-07-23 | Disposition: A | Discharge status: Home / Self Care | Payer: Medicare Other | Source: Ambulatory Visit | Attending: Radiation Oncology | Admitting: Radiation Oncology

## 2013-07-23 ENCOUNTER — Encounter: Payer: Self-pay | Admitting: Medical Oncology

## 2013-07-23 ENCOUNTER — Encounter: Payer: Self-pay | Admitting: *Deleted

## 2013-07-23 DIAGNOSIS — C155 Malignant neoplasm of lower third of esophagus: Secondary | ICD-10-CM

## 2013-07-23 DIAGNOSIS — C772 Secondary and unspecified malignant neoplasm of intra-abdominal lymph nodes: Secondary | ICD-10-CM | POA: Insufficient documentation

## 2013-07-23 DIAGNOSIS — R918 Other nonspecific abnormal finding of lung field: Secondary | ICD-10-CM | POA: Insufficient documentation

## 2013-07-23 DIAGNOSIS — C159 Malignant neoplasm of esophagus, unspecified: Secondary | ICD-10-CM

## 2013-07-23 LAB — COMPREHENSIVE METABOLIC PANEL (CC13)
AST: 15 U/L (ref 5–34)
Albumin: 3.8 g/dL (ref 3.5–5.0)
Alkaline Phosphatase: 77 U/L (ref 40–150)
BUN: 11.3 mg/dL (ref 7.0–26.0)
Calcium: 9.2 mg/dL (ref 8.4–10.4)
Chloride: 101 mEq/L (ref 98–109)
Glucose: 179 mg/dl — ABNORMAL HIGH (ref 70–140)
Potassium: 4.5 mEq/L (ref 3.5–5.1)
Sodium: 136 mEq/L (ref 136–145)
Total Protein: 6.7 g/dL (ref 6.4–8.3)

## 2013-07-23 LAB — CBC WITH DIFFERENTIAL/PLATELET
BASO%: 0.1 % (ref 0.0–2.0)
Basophils Absolute: 0 10*3/uL (ref 0.0–0.1)
EOS%: 1.1 % (ref 0.0–7.0)
HCT: 31.2 % — ABNORMAL LOW (ref 38.4–49.9)
HGB: 10 g/dL — ABNORMAL LOW (ref 13.0–17.1)
MONO#: 0.6 10*3/uL (ref 0.1–0.9)
NEUT%: 80.3 % — ABNORMAL HIGH (ref 39.0–75.0)
RDW: 13.5 % (ref 11.0–14.6)
WBC: 8.7 10*3/uL (ref 4.0–10.3)
lymph#: 1.1 10*3/uL (ref 0.9–3.3)

## 2013-07-23 LAB — GLUCOSE, CAPILLARY: Glucose-Capillary: 191 mg/dL — ABNORMAL HIGH (ref 70–99)

## 2013-07-23 MED ORDER — PROCHLORPERAZINE MALEATE 10 MG PO TABS: 10.0000 mg | ORAL_TABLET | Freq: Four times a day (QID) | ORAL | Status: DC | PRN

## 2013-07-23 MED ORDER — FLUDEOXYGLUCOSE F - 18 (FDG) INJECTION
20.4000 | Freq: Once | INTRAVENOUS | Status: AC | PRN
  Administered 2013-07-23: 20.4 via INTRAVENOUS

## 2013-07-23 NOTE — Telephone Encounter (Signed)
0

## 2013-07-24 ENCOUNTER — Other Ambulatory Visit (HOSPITAL_COMMUNITY): Payer: Medicare Other

## 2013-07-25 ENCOUNTER — Ambulatory Visit: Payer: Medicare Other | Admitting: Nutrition

## 2013-07-25 ENCOUNTER — Ambulatory Visit
Admission: RE | Admit: 2013-07-25 | Discharge: 2013-07-25 | Disposition: A | Discharge status: Home / Self Care | Payer: Medicare Other | Source: Ambulatory Visit | Attending: Radiation Oncology | Admitting: Radiation Oncology

## 2013-07-25 DIAGNOSIS — C155 Malignant neoplasm of lower third of esophagus: Secondary | ICD-10-CM

## 2013-07-25 DIAGNOSIS — Z510 Encounter for antineoplastic radiation therapy: Secondary | ICD-10-CM | POA: Insufficient documentation

## 2013-07-25 DIAGNOSIS — C159 Malignant neoplasm of esophagus, unspecified: Secondary | ICD-10-CM | POA: Insufficient documentation

## 2013-07-25 DIAGNOSIS — Z79899 Other long term (current) drug therapy: Secondary | ICD-10-CM | POA: Insufficient documentation

## 2013-07-25 NOTE — Progress Notes (Signed)
This patient is a 72 year old male diagnosed with Esophageal cancer.  Past medical history includes dyslipidemia, hypertension, iron deficiency, diabetes, depressive disorder, CAD, and anxiety.  Medications include Lipitor, CoQ10, Lasix, Humalog, multivitamin, Protonix, compazine and aldactone.  Labs include glucose 191.  Height: 68 inches. Weight: 169.8 pounds August 26. Usual body weight: 192 pounds March 2014. BMI: 25.82.    Patient reports that weight loss began as intentional weight loss.  However, continued when dysphasia developed.  He does have nausea occasionally.  He reports good control of blood sugar levels.  He monitors his salt intake.  Nutrition diagnosis: Predicted suboptimal energy intake related to diagnosis of esophageal cancer and associated treatments as evidenced by the history or presence of a condition for which research shows an increased incidence of suboptimal energy intake.  Intervention: Patient was educated to consume small, frequent meals with higher protein foods and adequate calories for weight maintenance.  Patient will continue to monitor carbohydrates and sodium in overall diet.  Patient was educated on foods to choose if he develops nausea.  He was encouraged to communicate with his physician if nausea medication is not helping him.  Briefly educated on food safety.  Also discussed the importance of soft, moist foods, and altering textures and temperatures of food as needed.  Monitoring, evaluation, goals: Patient will tolerate adequate calories and protein to minimize weight loss throughout treatment.  Visit: Thursday, September 11, during chemotherapy.

## 2013-07-30 ENCOUNTER — Telehealth: Payer: Self-pay | Admitting: Dietician

## 2013-08-03 ENCOUNTER — Other Ambulatory Visit: Payer: Self-pay | Admitting: Oncology

## 2013-08-06 ENCOUNTER — Ambulatory Visit
Admission: RE | Admit: 2013-08-06 | Discharge: 2013-08-06 | Disposition: A | Discharge status: Home / Self Care | Payer: Medicare Other | Source: Ambulatory Visit | Attending: Radiation Oncology | Admitting: Radiation Oncology

## 2013-08-06 DIAGNOSIS — C155 Malignant neoplasm of lower third of esophagus: Secondary | ICD-10-CM

## 2013-08-06 MED ORDER — BIAFINE EX EMUL
CUTANEOUS | Status: DC | PRN
  Administered 2013-08-06: 15:00:00 via TOPICAL

## 2013-08-06 NOTE — Progress Notes (Signed)
Billy Bush here with his son for post sim education.  He was given the Radiation Therapy and You book and discussed the potential side effects of treatment including fatigue, skin changes and throat changes.  He was given biafine cream and was instructed to use it twice a day after treatment and at bedtime.  He was also oriented to the clinic and discussed how under treat day works with Dr. Mitzi Hansen on Friday's.  He was instructed to contact nursing with any questions or concerns.

## 2013-08-07 ENCOUNTER — Other Ambulatory Visit: Payer: Self-pay | Admitting: Oncology

## 2013-08-07 ENCOUNTER — Telehealth: Payer: Self-pay | Admitting: *Deleted

## 2013-08-07 ENCOUNTER — Ambulatory Visit (HOSPITAL_BASED_OUTPATIENT_CLINIC_OR_DEPARTMENT_OTHER): Payer: Medicare Other | Admitting: Nurse Practitioner

## 2013-08-07 ENCOUNTER — Ambulatory Visit
Admission: RE | Admit: 2013-08-07 | Discharge: 2013-08-07 | Disposition: A | Discharge status: Home / Self Care | Payer: Medicare Other | Source: Ambulatory Visit | Attending: Radiation Oncology | Admitting: Radiation Oncology

## 2013-08-07 ENCOUNTER — Ambulatory Visit (HOSPITAL_BASED_OUTPATIENT_CLINIC_OR_DEPARTMENT_OTHER): Payer: Medicare Other

## 2013-08-07 ENCOUNTER — Telehealth: Payer: Self-pay | Admitting: Internal Medicine

## 2013-08-07 ENCOUNTER — Ambulatory Visit: Payer: Medicare Other | Admitting: Nutrition

## 2013-08-07 ENCOUNTER — Telehealth: Payer: Self-pay | Admitting: Oncology

## 2013-08-07 VITALS — BP 99/50 | HR 59 | Temp 97.9°F | Resp 20 | Ht 68.0 in | Wt 163.9 lb

## 2013-08-07 DIAGNOSIS — E119 Type 2 diabetes mellitus without complications: Secondary | ICD-10-CM

## 2013-08-07 DIAGNOSIS — C7A1 Malignant poorly differentiated neuroendocrine tumors: Secondary | ICD-10-CM

## 2013-08-07 DIAGNOSIS — R918 Other nonspecific abnormal finding of lung field: Secondary | ICD-10-CM

## 2013-08-07 DIAGNOSIS — I509 Heart failure, unspecified: Secondary | ICD-10-CM

## 2013-08-07 DIAGNOSIS — C159 Malignant neoplasm of esophagus, unspecified: Secondary | ICD-10-CM

## 2013-08-07 DIAGNOSIS — C155 Malignant neoplasm of lower third of esophagus: Secondary | ICD-10-CM

## 2013-08-07 DIAGNOSIS — R1310 Dysphagia, unspecified: Secondary | ICD-10-CM

## 2013-08-07 DIAGNOSIS — I959 Hypotension, unspecified: Secondary | ICD-10-CM

## 2013-08-07 DIAGNOSIS — R634 Abnormal weight loss: Secondary | ICD-10-CM

## 2013-08-07 DIAGNOSIS — Z5111 Encounter for antineoplastic chemotherapy: Secondary | ICD-10-CM

## 2013-08-07 MED ORDER — DEXAMETHASONE SODIUM PHOSPHATE 10 MG/ML IJ SOLN
10.0000 mg | Freq: Once | INTRAMUSCULAR | Status: AC
  Administered 2013-08-07: 10 mg via INTRAVENOUS

## 2013-08-07 MED ORDER — SODIUM CHLORIDE 0.9 % IV SOLN
Freq: Once | INTRAVENOUS | Status: AC
  Administered 2013-08-07: 12:00:00 via INTRAVENOUS

## 2013-08-07 MED ORDER — DEXAMETHASONE SODIUM PHOSPHATE 10 MG/ML IJ SOLN
INTRAMUSCULAR | Status: AC
  Administered 2013-08-07: 10 mg via INTRAVENOUS
  Filled 2013-08-07: qty 1

## 2013-08-07 MED ORDER — ONDANSETRON 8 MG/50ML IVPB (CHCC)
8.0000 mg | Freq: Once | INTRAVENOUS | Status: AC
  Administered 2013-08-07: 8 mg via INTRAVENOUS

## 2013-08-07 MED ORDER — SODIUM CHLORIDE 0.9 % IV SOLN
187.6000 mg | Freq: Once | INTRAVENOUS | Status: AC
  Administered 2013-08-07: 190 mg via INTRAVENOUS
  Filled 2013-08-07: qty 19

## 2013-08-07 MED ORDER — ONDANSETRON 8 MG/NS 50 ML IVPB
INTRAVENOUS | Status: AC
  Filled 2013-08-07: qty 8

## 2013-08-07 NOTE — Telephone Encounter (Signed)
New problem   Cancer center Dr. Truett Perna   C/O  Losing weight, mild hypotension . Discuss medication adjustment.

## 2013-08-07 NOTE — Telephone Encounter (Signed)
gv and printed appt sched and avs for pt for SEpt...emailed MW to add tx.  °

## 2013-08-07 NOTE — Patient Instructions (Addendum)
[facility] Discharge Instructions for Patients Receiving Chemotherapy  Today you received the following chemotherapy agents Carboplatin To help prevent nausea and vomiting after your treatment, we encourage you to take your nausea medication as prescribed.  If you develop nausea and vomiting that is not controlled by your nausea medication, call the clinic.   BELOW ARE SYMPTOMS THAT SHOULD BE REPORTED IMMEDIATELY:  *FEVER GREATER THAN 100.5 F  *CHILLS WITH OR WITHOUT FEVER  NAUSEA AND VOMITING THAT IS NOT CONTROLLED WITH YOUR NAUSEA MEDICATION  *UNUSUAL SHORTNESS OF BREATH  *UNUSUAL BRUISING OR BLEEDING  TENDERNESS IN MOUTH AND THROAT WITH OR WITHOUT PRESENCE OF ULCERS  *URINARY PROBLEMS  *BOWEL PROBLEMS  UNUSUAL RASH Items with * indicate a potential emergency and should be followed up as soon as possible.  Feel free to call the clinic you have any questions or concerns. The clinic phone number is [phone].    

## 2013-08-07 NOTE — Progress Notes (Addendum)
OFFICE PROGRESS NOTE  Interval history:  Mr. Goehring is a 72 year old man with locally advanced esophagus cancer. Staging PET scan on 07/23/2013 showed the esophageal mass with neoplastic range FDG uptake. There was no mediastinal or hilar lymphadenopathy or abnormal metabolic activity. Small pulmonary nodules demonstrated on the recent chest CT showed no abnormal metabolic activity. There was bulky gastrohepatic ligament and periportal lymphadenopathy along with borderline enlarged celiac axis lymph nodes with SUV max 6.4.  Mr. Tuccillo reports he is "tired". His wife recently had back surgery and he has been helping her with ADLs. He is tolerating liquids. He denies pain with swallowing. He continues to have dysphagia with solids. He continues to lose weight. He denies lightheadedness or dizziness. No shortness of breath or chest pain. No leg swelling. Bowels moving regularly.   Objective: Blood pressure 99/50, pulse 59, temperature 97.9 F (36.6 C), temperature source Oral, resp. rate 20, height 5\' 8"  (1.727 m), weight 163 lb 14.4 oz (74.345 kg).  Oropharynx is without thrush or ulceration. Mucous membranes appear moist. Lungs are clear. Regular cardiac rhythm. Abdomen is soft. No hepatomegaly. Extremities are without edema. Skin turgor is normal. Purpura over the dorsum of both hands/forearms.  Lab Results: Lab Results  Component Value Date   WBC 8.7 07/23/2013   HGB 10.0* 07/23/2013   HCT 31.2* 07/23/2013   MCV 90.2 07/23/2013   PLT 233 07/23/2013    Chemistry:    Chemistry      Component Value Date/Time   NA 136 07/23/2013 0932   NA 135 07/04/2013 0932   K 4.5 07/23/2013 0932   K 4.7 07/04/2013 0932   CL 104 07/04/2013 0932   CO2 24 07/23/2013 0932   CO2 25 07/04/2013 0932   BUN 11.3 07/23/2013 0932   BUN 14 07/04/2013 0932   CREATININE 1.1 07/23/2013 0932   CREATININE 1.0 07/04/2013 0932      Component Value Date/Time   CALCIUM 9.2 07/23/2013 0932   CALCIUM 8.9 07/04/2013 0932   ALKPHOS 77  07/23/2013 0932   ALKPHOS 61 01/03/2013 0859   AST 15 07/23/2013 0932   AST 19 01/03/2013 0859   ALT 10 07/23/2013 0932   ALT 14 01/03/2013 0859   BILITOT 1.32* 07/23/2013 0932   BILITOT 1.0 01/03/2013 0859       Studies/Results: Ct Chest W Contrast  07/23/2013   **ADDENDUM** CREATED: 07/23/2013 11:12:39  Dictation error in the original report.  The 7 mm pulmonary nodule is located in the medial right UPPER lobe, as noted in the findings section but incorrectly described in the impression.  Additionally, there are two 8-9 mm subcapsular liver lesions in the posterior right hepatic dome (series 2/image 47) and inferior right hepatic lobe (series 2/image 77) which are favored to reflect small hemangiomas.  This can be further assessed on pending PET-CT.  **END ADDENDUM** SIGNED BY: Charline Bills, M.D.  07/11/2013   *RADIOLOGY REPORT*  Clinical Data:  Esophageal mass, evaluate for metastases  CT CHEST, ABDOMEN AND PELVIS WITHOUT CONTRAST  Technique:  Multidetector CT imaging of the chest, abdomen and pelvis was performed following the standard protocol without IV contrast.  Comparison:  None.  CT CHEST  Findings:  7 mm nodule in the medial right upper lobe (series 4/image 28).  Adjacent minimal nodularity (series 4/image 27). Additional minimal nodularity/scarring in the right middle lobe and lingula.  Trace bilateral pleural effusions.  No pneumothorax.  1.8 cm right thyroid nodule (series 2/image 4).  The heart is normal in  size.  No pericardial effusion.  Coronary atherosclerosis. Postsurgical changes related to prior CABG. Atherosclerotic calcifications of the aortic arch.  Left subclavian ICD.  9 mm short-axis right paratracheal node (series 2/image 22), within normal limits.  No suspicious hilar or axillary lymphadenopathy.  Concentric distal esophageal mass, measuring up to 2.2 cm in thickness (series 2/image 44), compatible with primary esophageal neoplasm.  Tumor extends to the GE junction but does not  definitely involve the gastric cardia (series 2/image 48).  Degenerative changes of the thoracic spine.  IMPRESSION: Distal esophageal mass, compatible with primary esophageal neoplasm, as described above.  Tumor extends to the GE junction but does not definitely involve the gastric cardia.  7 mm nodule in the medial right lower lobe with adjacent minimal nodularity, indeterminate, possibly infectious/inflammatory. Attention on follow-up is suggested.  Trace bilateral pleural effusions.  CT ABDOMEN AND PELVIS  Findings:  Liver, spleen, pancreas, and adrenal glands are within normal limits.  Layering gallstones (series 2/image 61), without associated inflammatory changes.  No intrahepatic or extrahepatic ductal dilatation.  Suspected vascular calcifications in the right kidney (series 2/images 63 and 68).  Kidneys are otherwise unremarkable.  No hydronephrosis.  No evidence of bowel obstruction.  Normal appendix.  Atherosclerotic calcifications of the abdominal aorta and branch vessels.  No abdominopelvic ascites.  Upper abdominal/retroperitoneal lymphadenopathy, suspicious for nodal metastases, including: --2.2 x 2.6 cm gastrohepatic node (series 2/image 52) --3.8 x 3.2 cm gastrohepatic node (series 2/image 55) --1.8 x 1.3 cm left celiac axis node (series 2/image 57) --1.1 x 0.7 cm left para-aortic node (series 2/image 62)  Prostate is notable for mild enlargement of the central gland which indents the base of bladder (series 2/image 116).  Bladder is within normal limits.  Degenerative changes of the lumbar spine.  IMPRESSION: Upper abdominal/retroperitoneal lymphadenopathy, as described above, suspicious for nodal metastases.  Cholelithiasis, without associated inflammatory changes.  Original Report Authenticated By: Charline Bills, M.D.   Ct Abdomen Pelvis W Contrast  07/23/2013   **ADDENDUM** CREATED: 07/23/2013 11:12:39  Dictation error in the original report.  The 7 mm pulmonary nodule is located in the  medial right UPPER lobe, as noted in the findings section but incorrectly described in the impression.  Additionally, there are two 8-9 mm subcapsular liver lesions in the posterior right hepatic dome (series 2/image 47) and inferior right hepatic lobe (series 2/image 77) which are favored to reflect small hemangiomas.  This can be further assessed on pending PET-CT.  **END ADDENDUM** SIGNED BY: Charline Bills, M.D.  07/11/2013   *RADIOLOGY REPORT*  Clinical Data:  Esophageal mass, evaluate for metastases  CT CHEST, ABDOMEN AND PELVIS WITHOUT CONTRAST  Technique:  Multidetector CT imaging of the chest, abdomen and pelvis was performed following the standard protocol without IV contrast.  Comparison:  None.  CT CHEST  Findings:  7 mm nodule in the medial right upper lobe (series 4/image 28).  Adjacent minimal nodularity (series 4/image 27). Additional minimal nodularity/scarring in the right middle lobe and lingula.  Trace bilateral pleural effusions.  No pneumothorax.  1.8 cm right thyroid nodule (series 2/image 4).  The heart is normal in size.  No pericardial effusion.  Coronary atherosclerosis. Postsurgical changes related to prior CABG. Atherosclerotic calcifications of the aortic arch.  Left subclavian ICD.  9 mm short-axis right paratracheal node (series 2/image 22), within normal limits.  No suspicious hilar or axillary lymphadenopathy.  Concentric distal esophageal mass, measuring up to 2.2 cm in thickness (series 2/image 44), compatible with primary  esophageal neoplasm.  Tumor extends to the GE junction but does not definitely involve the gastric cardia (series 2/image 48).  Degenerative changes of the thoracic spine.  IMPRESSION: Distal esophageal mass, compatible with primary esophageal neoplasm, as described above.  Tumor extends to the GE junction but does not definitely involve the gastric cardia.  7 mm nodule in the medial right lower lobe with adjacent minimal nodularity, indeterminate, possibly  infectious/inflammatory. Attention on follow-up is suggested.  Trace bilateral pleural effusions.  CT ABDOMEN AND PELVIS  Findings:  Liver, spleen, pancreas, and adrenal glands are within normal limits.  Layering gallstones (series 2/image 61), without associated inflammatory changes.  No intrahepatic or extrahepatic ductal dilatation.  Suspected vascular calcifications in the right kidney (series 2/images 63 and 68).  Kidneys are otherwise unremarkable.  No hydronephrosis.  No evidence of bowel obstruction.  Normal appendix.  Atherosclerotic calcifications of the abdominal aorta and branch vessels.  No abdominopelvic ascites.  Upper abdominal/retroperitoneal lymphadenopathy, suspicious for nodal metastases, including: --2.2 x 2.6 cm gastrohepatic node (series 2/image 52) --3.8 x 3.2 cm gastrohepatic node (series 2/image 55) --1.8 x 1.3 cm left celiac axis node (series 2/image 57) --1.1 x 0.7 cm left para-aortic node (series 2/image 62)  Prostate is notable for mild enlargement of the central gland which indents the base of bladder (series 2/image 116).  Bladder is within normal limits.  Degenerative changes of the lumbar spine.  IMPRESSION: Upper abdominal/retroperitoneal lymphadenopathy, as described above, suspicious for nodal metastases.  Cholelithiasis, without associated inflammatory changes.  Original Report Authenticated By: Charline Bills, M.D.   Nm Pet Image Initial (pi) Skull Base To Thigh  07/23/2013   *RADIOLOGY REPORT*  Clinical Data: Initial treatment strategy for esophageal cancer.  NUCLEAR MEDICINE PET SKULL BASE TO THIGH  Fasting Blood Glucose:  191  Technique:  20.4 mCi F-18 FDG was injected intravenously. CT data was obtained and used for attenuation correction and anatomic localization only.  (This was not acquired as a diagnostic CT examination.) Additional exam technical data entered on technologist worksheet.  Comparison:  CT scan is 07/11/2013  Findings:  Neck: No hypermetabolic lymph  nodes in the neck.  Chest:  Diffuse FDG uptake in the large esophageal mass with SUV max of 6.4.  No mediastinal or hilar lymphadenopathy or abnormal metabolic activity.  Small pulmonary nodules demonstrated on the recent chest CT but no abnormal metabolic activity.  Abdomen/Pelvis:  As demonstrated on the CT scan there is bulky gastrohepatic ligament and periportal lymphadenopathy along with borderline enlarged celiac axis lymph nodes.  SUV Max is 6.4.  No other significant findings in the abdomen/pelvis.  Additional abdominal findings include gallstones, right renal calculus, advanced atherosclerotic calcifications involving the aorta and branch vessels and prostate gland enlargement.  Skeleton:  No focal hypermetabolic activity to suggest skeletal metastasis.  IMPRESSION:  1.  Large distal esophageal mass with neoplastic range FDG uptake. 2.  Metastatic upper abdominal lymphadenopathy. 3.  No definite hepatic metastatic disease. 4.  No adenopathy in the chest.  Indeterminate small pulmonary nodules.   Original Report Authenticated By: Rudie Meyer, M.D.    Medications: I have reviewed the patient's current medications.  Assessment/Plan:  1. High-grade neuroendocrine carcinoma of the esophagus, clinical stage III. Staging PET scan 07/23/2013 showed a large distal esophageal mass with neoplastic range FDG uptake, metastatic upper abdominal lymphadenopathy, indeterminate small pulmonary nodules. -initiation of radiation on 08/06/2013 -Cycle 1 of weekly carboplatin and 08/07/2013 2. Dysphagia secondary to #1. 3. History of coronary artery disease. 4.  Congestive heart failure. 5. Diabetes. 6. Weight loss. 7. Mild hypotension in the office today.  Disposition-Mr. Wymore appears to have a locally advanced high-grade neuroendocrine esophagus cancer. The recent PET scan findings were reviewed with him and his son.   He began radiation on 08/06/2013. At the time of his initial visit on 07/21/2013 Dr.  Truett Perna discussed concurrent weekly Taxol/carboplatin chemotherapy. Due to the neuroendocrine histology, Dr. Truett Perna recommends concurrent weekly chemotherapy with carboplatin alone. We again reviewed potential toxicities associated with carboplatin. He is agreeable to proceed. He will receive the first weekly carboplatin today.  Following completion of concurrent chemotherapy/radiation, chemotherapy with carboplatin/etoposide will be considered.  Mr. Stankovich continues to lose weight. He will meet with the [facility] dietitian today.   He is mildly hypotensive. He is on several cardiac medications. We will contact Dr. Olga Millers office for assistance with possible medication adjustments.    We will see Mr. Geesey in followup in 2 weeks.    Lonna Cobb ANP/GNP-BC  This was a she revisit with Lonna Cobb. Mr. Decarolis began chest radiation yesterday. He has been diagnosed with a high-grade neuroendocrine carcinoma of the esophagus.we discussed the staging PET scan results and treatment options. I discussed the case with my colleagues and Dr. Mitzi Hansen.  The plan is to begin weekly carboplatin chemotherapy concurrent with radiation. At the completion of radiation we will assess his response to therapy and consider consolidation with etoposide/carboplatin.  Mr. Stradling will return for an office visit in 2 weeks.  Mancel Bale, M.D.

## 2013-08-07 NOTE — Progress Notes (Signed)
Patient reports that it is getting very difficult for him to eat.  Taking much longer to chew and swallow food.  He continues to complain of nausea with food odors as well as food passing through his esophagus.  Weight has decreased to 163.9 pounds September 11 from 169.8 pounds August 26.  Nutrition diagnosis: Predicted suboptimal energy intake has evolved into inadequate oral intake related to difficulty swallowing as evidenced by a 6 pound weight loss in 2 weeks.  Intervention: Patient was educated to add oral nutrition supplements such as milk shakes 3 times a day between meals.  I've encouraged him to try Ensure Plus or boost plus as an alternative.  I've also recommended patient utilize [employer] essentials since he enjoys milk.  I educated him on strategies for improving taste.  I have stressed the importance of increased hydration.  Teach method used.  Fact sheets provided.  Patient encouraged to call me with questions or concerns.  Monitoring, evaluation, goals: Patient has been unable to tolerate enough calories and protein, and has had a 6 pound weight loss in 2 weeks.  Next visit: Thursday, September 18, during chemotherapy.

## 2013-08-07 NOTE — Telephone Encounter (Signed)
Per staff message and POF I have scheduled appts.  JMW  

## 2013-08-07 NOTE — Telephone Encounter (Signed)
Routed to Dr. Taylor

## 2013-08-08 ENCOUNTER — Ambulatory Visit
Admission: RE | Admit: 2013-08-08 | Discharge: 2013-08-08 | Disposition: A | Discharge status: Home / Self Care | Payer: Medicare Other | Source: Ambulatory Visit | Attending: Radiation Oncology | Admitting: Radiation Oncology

## 2013-08-08 ENCOUNTER — Encounter: Payer: Self-pay | Admitting: *Deleted

## 2013-08-08 ENCOUNTER — Telehealth: Payer: Self-pay | Admitting: *Deleted

## 2013-08-08 VITALS — BP 115/57 | HR 54 | Temp 97.3°F | Resp 20 | Wt 162.7 lb

## 2013-08-08 DIAGNOSIS — C155 Malignant neoplasm of lower third of esophagus: Secondary | ICD-10-CM

## 2013-08-08 NOTE — Telephone Encounter (Signed)
Only complaint is feeling restless and jittery from what he thinks is from the steroids. No nausea. Took Compazine at bedtime last night and again this am. No other problems. Has bottle of Ativan 1 mg from August-asking if OK to take this for his restlessness?  OK to take Ativan 0.5 to 1 mg every 6 hours prn restlessness/nausea. Call if this does not help.

## 2013-08-08 NOTE — Progress Notes (Signed)
Pt denies pain, does report loss of appetite, fatigue. Pt is caring for wife who had back surgery 2 weeks ago. Pt states he "feels like there is something in his throat which causes a feeling of nausea".  Pt taking Compazine every night and every morning and states he has not had nausea. Pt states he eats in very small bites and very slowly, does not have sensation of food not going down his esophagus.

## 2013-08-08 NOTE — Progress Notes (Signed)
Department of Radiation Oncology  Phone:  [phone] Fax:        [phone]  Weekly Treatment Note    Name: [name] Date: 08/08/2013 MRN: [id number] DOB: [birth year]   Current dose: 5.4 Gy  Current fraction: 3   MEDICATIONS: Current Outpatient Prescriptions  Medication Sig Dispense Refill  . aspirin 325 MG EC tablet Take 325 mg by mouth daily.        Marland Kitchen atorvastatin (LIPITOR) 80 MG tablet TAKE 1 TABLET AT BEDTIME  90 tablet  0  . carvedilol (COREG) 12.5 MG tablet 2 (two) times daily with a meal.       . citalopram (CELEXA) 10 MG tablet Take 1 tablet (10 mg total) by mouth daily.  90 tablet  2  . clopidogrel (PLAVIX) 75 MG tablet TAKE 1 TABLET BY MOUTH ONCE DAILY  90 tablet  2  . co-enzyme Q-10 30 MG capsule Take 30 mg by mouth daily.        Marland Kitchen emollient (BIAFINE) cream Apply 1 application topically daily. Apply  To affected skin areaafter rad txs daily and prn, just not 4 hours prior to rad txs      . furosemide (LASIX) 80 MG tablet Take 80 mg by mouth daily.       Marland Kitchen glucose blood (ONE TOUCH ULTRA TEST) test strip Use as directed twice daily dx 250.01  200 each  3  . ibuprofen (ADVIL,MOTRIN) 200 MG tablet Take 200 mg by mouth every 8 (eight) hours as needed for pain.      Marland Kitchen insulin lispro (HUMALOG) 100 UNIT/ML injection Inject into the skin 3x a day (just before each meal) 11-04-27 units  15 mL  2  . Insulin Pen Needle (BD PEN NEEDLE NANO U/F) 32G X 4 MM MISC Use as directed four times daily  400 each  3  . Multiple Vitamin (MULTIVITAMIN) tablet Take 1 tablet by mouth daily.        . nitroGLYCERIN (NITROSTAT) 0.4 MG SL tablet Place 1 tablet (0.4 mg total) under the tongue every 5 (five) minutes as needed for chest pain.  25 tablet  3  . pantoprazole (PROTONIX) 40 MG tablet Take 1 tablet (40 mg total) by mouth daily.  90 tablet  3  . prochlorperazine (COMPAZINE) 10 MG tablet Take 1 tablet (10 mg total) by mouth every 6 (six) hours as needed.  60 tablet  2  . ramipril  (ALTACE) 5 MG capsule TAKE 1 CAPSULE BY MOUTH DAILY  90 capsule  3  . spironolactone (ALDACTONE) 25 MG tablet 1/2 tablet daily       No current facility-administered medications for this encounter.     ALLERGIES: Review of patient's allergies indicates no known allergies.   LABORATORY DATA:  Lab Results  Component Value Date   WBC 8.7 07/23/2013   HGB 10.0* 07/23/2013   HCT 31.2* 07/23/2013   MCV 90.2 07/23/2013   PLT 233 07/23/2013   Lab Results  Component Value Date   NA 136 07/23/2013   K 4.5 07/23/2013   CL 104 07/04/2013   CO2 24 07/23/2013   Lab Results  Component Value Date   ALT 10 07/23/2013   AST 15 07/23/2013   ALKPHOS 77 07/23/2013   BILITOT 1.32* 07/23/2013     NARRATIVE: Billy Bush was seen today for weekly treatment management. The chart was checked and the patient's films were reviewed. The patient has begun his treatment. He has done well. No complaints so  far with his radiotherapy.  PHYSICAL EXAMINATION: weight is 162 lb 11.2 oz (73.8 kg). His oral temperature is 97.3 F (36.3 C). His blood pressure is 115/57 and his pulse is 54. His respiration is 20.        ASSESSMENT: The patient is doing satisfactorily with treatment.  PLAN: We will continue with the patient's radiation treatment as planned.

## 2013-08-08 NOTE — Progress Notes (Signed)
   Radiation Oncology         [phone] ________________________________  Name: Billy Bush MRN: [id number]  Date: 07/25/2013  DOB: [birth year]  SIMULATION AND TREATMENT PLANNING NOTE   CONSENT VERIFIED: yes   SET UP: Patient is set-up supine   IMMOBILIZATION: The following immobilization is used: vac-lock bag. This complex treatment device will be used on a daily basis during the patient's treatment.   Diagnosis: Esophageal cancer   NARRATIVE: The patient was brought to the CT Simulation planning suite. Identity was confirmed. All relevant records and images related to the planned course of therapy were reviewed. Then, the patient was positioned in a stable reproducible clinical set-up for radiation therapy using a customized Vac-lock bag. Skin markings were placed. The CT images were loaded into the planning software where the target and avoidance structures were contoured.The radiation prescription was entered and confirmed.   The patient will receive 50.4 Gy in 28 fractions.   Daily image guidance is ordered, and this will be used on a daily basis. This is necessary to ensure accurate and precise localization of the target in addition to accurate alignment of the normal tissue structures in this region.   Treatment planning then occurred.   I have requested : Intensity Modulated Radiotherapy (IMRT) is medically necessary for this case for the following reason: Dose homogeneity; the target is in close proximity to critical normal structures, including the heart , liver, spinal cord, lungs. IMRT is thus medically to appropriately treat the patient.   Special treatment procedure  The patient will receive chemotherapy during the course of radiation treatment. The patient may experience increased or overlapping toxicity due to this combined-modality approach and the patient will be monitored for such problems. This may include extra lab work as necessary. This therefore constitutes  a special treatment procedure.    ________________________________  Radene Gunning, MD, PhD

## 2013-08-11 ENCOUNTER — Ambulatory Visit
Admission: RE | Admit: 2013-08-11 | Discharge: 2013-08-11 | Disposition: A | Discharge status: Home / Self Care | Payer: Medicare Other | Source: Ambulatory Visit | Attending: Radiation Oncology | Admitting: Radiation Oncology

## 2013-08-12 ENCOUNTER — Ambulatory Visit
Admission: RE | Admit: 2013-08-12 | Discharge: 2013-08-12 | Disposition: A | Discharge status: Home / Self Care | Payer: Medicare Other | Source: Ambulatory Visit | Attending: Radiation Oncology | Admitting: Radiation Oncology

## 2013-08-12 ENCOUNTER — Other Ambulatory Visit: Payer: Self-pay | Admitting: Oncology

## 2013-08-13 ENCOUNTER — Ambulatory Visit
Admission: RE | Admit: 2013-08-13 | Discharge: 2013-08-13 | Disposition: A | Discharge status: Home / Self Care | Payer: Medicare Other | Source: Ambulatory Visit | Attending: Radiation Oncology | Admitting: Radiation Oncology

## 2013-08-14 ENCOUNTER — Ambulatory Visit
Admission: RE | Admit: 2013-08-14 | Discharge: 2013-08-14 | Disposition: A | Discharge status: Home / Self Care | Payer: Medicare Other | Source: Ambulatory Visit | Attending: Radiation Oncology | Admitting: Radiation Oncology

## 2013-08-14 ENCOUNTER — Ambulatory Visit (HOSPITAL_BASED_OUTPATIENT_CLINIC_OR_DEPARTMENT_OTHER): Payer: Medicare Other

## 2013-08-14 ENCOUNTER — Ambulatory Visit: Payer: Medicare Other | Admitting: Nutrition

## 2013-08-14 ENCOUNTER — Other Ambulatory Visit: Payer: Self-pay | Admitting: Oncology

## 2013-08-14 ENCOUNTER — Other Ambulatory Visit (HOSPITAL_BASED_OUTPATIENT_CLINIC_OR_DEPARTMENT_OTHER): Payer: Medicare Other | Admitting: Lab

## 2013-08-14 VITALS — BP 105/46 | HR 60 | Temp 97.6°F | Resp 20

## 2013-08-14 DIAGNOSIS — Z5111 Encounter for antineoplastic chemotherapy: Secondary | ICD-10-CM

## 2013-08-14 DIAGNOSIS — C7A1 Malignant poorly differentiated neuroendocrine tumors: Secondary | ICD-10-CM

## 2013-08-14 DIAGNOSIS — C159 Malignant neoplasm of esophagus, unspecified: Secondary | ICD-10-CM

## 2013-08-14 DIAGNOSIS — C155 Malignant neoplasm of lower third of esophagus: Secondary | ICD-10-CM

## 2013-08-14 LAB — CBC WITH DIFFERENTIAL/PLATELET
BASO%: 0.1 % (ref 0.0–2.0)
EOS%: 0.9 % (ref 0.0–7.0)
MCH: 28.7 pg (ref 27.2–33.4)
MCHC: 32.5 g/dL (ref 32.0–36.0)
MONO%: 7.8 % (ref 0.0–14.0)
RDW: 13.4 % (ref 11.0–14.6)
lymph#: 0.6 10*3/uL — ABNORMAL LOW (ref 0.9–3.3)

## 2013-08-14 MED ORDER — ONDANSETRON 8 MG/NS 50 ML IVPB
INTRAVENOUS | Status: AC
  Filled 2013-08-14: qty 8

## 2013-08-14 MED ORDER — DEXAMETHASONE SODIUM PHOSPHATE 10 MG/ML IJ SOLN
INTRAMUSCULAR | Status: AC
  Filled 2013-08-14: qty 1

## 2013-08-14 MED ORDER — CARBOPLATIN CHEMO INJECTION 450 MG/45ML
190.0000 mg | Freq: Once | INTRAVENOUS | Status: AC
  Administered 2013-08-14: 190 mg via INTRAVENOUS
  Filled 2013-08-14: qty 19

## 2013-08-14 MED ORDER — SODIUM CHLORIDE 0.9 % IV SOLN
Freq: Once | INTRAVENOUS | Status: AC
  Administered 2013-08-14: 10:00:00 via INTRAVENOUS

## 2013-08-14 MED ORDER — DEXAMETHASONE SODIUM PHOSPHATE 10 MG/ML IJ SOLN
10.0000 mg | Freq: Once | INTRAMUSCULAR | Status: AC
  Administered 2013-08-14: 10 mg via INTRAVENOUS

## 2013-08-14 MED ORDER — ONDANSETRON 8 MG/50ML IVPB (CHCC)
8.0000 mg | Freq: Once | INTRAVENOUS | Status: AC
  Administered 2013-08-14: 8 mg via INTRAVENOUS

## 2013-08-14 NOTE — Patient Instructions (Addendum)
[facility] Discharge Instructions for Patients Receiving Chemotherapy  Today you received the following chemotherapy agents Carboplatin To help prevent nausea and vomiting after your treatment, we encourage you to take your nausea medication as prescribed.  If you develop nausea and vomiting that is not controlled by your nausea medication, call the clinic.   BELOW ARE SYMPTOMS THAT SHOULD BE REPORTED IMMEDIATELY:  *FEVER GREATER THAN 100.5 F  *CHILLS WITH OR WITHOUT FEVER  NAUSEA AND VOMITING THAT IS NOT CONTROLLED WITH YOUR NAUSEA MEDICATION  *UNUSUAL SHORTNESS OF BREATH  *UNUSUAL BRUISING OR BLEEDING  TENDERNESS IN MOUTH AND THROAT WITH OR WITHOUT PRESENCE OF ULCERS  *URINARY PROBLEMS  *BOWEL PROBLEMS  UNUSUAL RASH Items with * indicate a potential emergency and should be followed up as soon as possible.  Feel free to call the clinic you have any questions or concerns. The clinic phone number is [phone].    

## 2013-08-14 NOTE — Progress Notes (Signed)
Patient reports continued nausea with food odors.  He continues to have difficulty swallowing.  He states he is trying very hard to eat and drink enough.  Weight has decreased to 162.7 pounds on September 12 from 169.8 pounds August 26.  Patient has tried oral nutrition supplements, but most of them are too sweet.  Nutrition diagnosis: Inadequate oral intake continues.  Intervention: I educated patient on a variety of oral nutrition supplements he could experiment with, and recipes he could use to improve flavor.  I have given him multiple samples of oral nutrition supplements.  We have discussed other foods that he may tolerate.  He understands to eat and drink continually throughout the day.  Teach back method used.  Patient expresses appreciation.  Monitoring, evaluation, goals: Patient is working to increase calories and protein, but has had weight loss.  Next visit: Thursday, September 25, during chemotherapy.

## 2013-08-15 ENCOUNTER — Ambulatory Visit
Admission: RE | Admit: 2013-08-15 | Discharge: 2013-08-15 | Disposition: A | Discharge status: Home / Self Care | Payer: Medicare Other | Source: Ambulatory Visit | Attending: Radiation Oncology | Admitting: Radiation Oncology

## 2013-08-15 VITALS — BP 88/45 | HR 58 | Temp 97.6°F | Ht 68.0 in | Wt 163.7 lb

## 2013-08-15 DIAGNOSIS — C155 Malignant neoplasm of lower third of esophagus: Secondary | ICD-10-CM

## 2013-08-15 MED ORDER — SUCRALFATE 1 G PO TABS: 1.0000 g | ORAL_TABLET | Freq: Four times a day (QID) | ORAL | Status: DC

## 2013-08-15 NOTE — Progress Notes (Signed)
Billy Bush here for weekly under treat visit.  He has had 8 fractions to his esophagus.  He denies pain.  He states he was able to eat [employer] last night and did not have the feeling in his throat that makes him feel like throwing up.  It is back this morning however.  He is wondering if there is something he can take to numb his throat.  He denies a sore throat.  The skin on his neck and upper chest is intact.  He is using biafine cream.  He denies fatigue.  He did have chemotherapy yesterday.  His bp today is 100/47 and hr is 57.  He says he does get dizzy when standing.  Orthostatic vitals done:  bp standing 88/45 and hr 58.

## 2013-08-15 NOTE — Progress Notes (Signed)
Department of Radiation Oncology  Phone:  [phone] Fax:        [phone]  Weekly Treatment Note    Name: Billy Bush Date: 08/15/2013 MRN: [id number] DOB: August 07, 1941   Current dose: 14.4 Gy  Current fraction: 8   MEDICATIONS: Current Outpatient Prescriptions  Medication Sig Dispense Refill  . aspirin 325 MG EC tablet Take 325 mg by mouth daily.        Marland Kitchen atorvastatin (LIPITOR) 80 MG tablet TAKE 1 TABLET AT BEDTIME  90 tablet  0  . carvedilol (COREG) 12.5 MG tablet 2 (two) times daily with a meal.       . citalopram (CELEXA) 10 MG tablet Take 1 tablet (10 mg total) by mouth daily.  90 tablet  2  . clopidogrel (PLAVIX) 75 MG tablet TAKE 1 TABLET BY MOUTH ONCE DAILY  90 tablet  2  . co-enzyme Q-10 30 MG capsule Take 30 mg by mouth daily.        Marland Kitchen emollient (BIAFINE) cream Apply 1 application topically daily. Apply  To affected skin areaafter rad txs daily and prn, just not 4 hours prior to rad txs      . furosemide (LASIX) 80 MG tablet Take 80 mg by mouth daily.       Marland Kitchen glucose blood (ONE TOUCH ULTRA TEST) test strip Use as directed twice daily dx 250.01  200 each  3  . ibuprofen (ADVIL,MOTRIN) 200 MG tablet Take 200 mg by mouth every 8 (eight) hours as needed for pain.      Marland Kitchen insulin lispro (HUMALOG) 100 UNIT/ML injection Inject into the skin 3x a day (just before each meal) 11-04-27 units  15 mL  2  . Insulin Pen Needle (BD PEN NEEDLE NANO U/F) 32G X 4 MM MISC Use as directed four times daily  400 each  3  . LORazepam (ATIVAN) 1 MG tablet Take 0.5-1 mg by mouth every 6 (six) hours as needed (nausea).      . Multiple Vitamin (MULTIVITAMIN) tablet Take 1 tablet by mouth daily.        . pantoprazole (PROTONIX) 40 MG tablet Take 1 tablet (40 mg total) by mouth daily.  90 tablet  3  . prochlorperazine (COMPAZINE) 10 MG tablet Take 1 tablet (10 mg total) by mouth every 6 (six) hours as needed.  60 tablet  2  . spironolactone (ALDACTONE) 25 MG tablet 1/2 tablet daily      .  nitroGLYCERIN (NITROSTAT) 0.4 MG SL tablet Place 1 tablet (0.4 mg total) under the tongue every 5 (five) minutes as needed for chest pain.  25 tablet  3  . ramipril (ALTACE) 5 MG capsule TAKE 1 CAPSULE BY MOUTH DAILY  90 capsule  3  . sucralfate (CARAFATE) 1 G tablet Take 1 tablet (1 g total) by mouth 4 (four) times daily.  120 tablet  2   No current facility-administered medications for this encounter.     ALLERGIES: Review of patient's allergies indicates no known allergies.   LABORATORY DATA:  Lab Results  Component Value Date   WBC 9.9 08/14/2013   HGB 9.2* 08/14/2013   HCT 28.3* 08/14/2013   MCV 88.2 08/14/2013   PLT 209 08/14/2013   Lab Results  Component Value Date   NA 136 07/23/2013   K 4.5 07/23/2013   CL 104 07/04/2013   CO2 24 07/23/2013   Lab Results  Component Value Date   ALT 10 07/23/2013   AST 15 07/23/2013  ALKPHOS 77 07/23/2013   BILITOT 1.32* 07/23/2013     NARRATIVE: KHAM ZUCKERMAN was seen today for weekly treatment management. The chart was checked and the patient's films were reviewed. The patient is doing better at this time. He states that he is swallowing better and went to [employer] last night and had a meal. He is very pleased about this and thankful. Some esophagitis emerging.  PHYSICAL EXAMINATION: height is 5\' 8"  (1.727 m) and weight is 163 lb 11.2 oz (74.254 kg). His temperature is 97.6 F (36.4 C). His blood pressure is 88/45 and his pulse is 58.        ASSESSMENT: The patient is doing satisfactorily with treatment.  PLAN: We will continue with the patient's radiation treatment as planned. I've called in the patient a prescription for Carafate.

## 2013-08-18 ENCOUNTER — Ambulatory Visit
Admission: RE | Admit: 2013-08-18 | Discharge: 2013-08-18 | Disposition: A | Discharge status: Home / Self Care | Payer: Medicare Other | Source: Ambulatory Visit | Attending: Radiation Oncology | Admitting: Radiation Oncology

## 2013-08-19 ENCOUNTER — Ambulatory Visit
Admission: RE | Admit: 2013-08-19 | Discharge: 2013-08-19 | Disposition: A | Discharge status: Home / Self Care | Payer: Medicare Other | Source: Ambulatory Visit | Attending: Radiation Oncology | Admitting: Radiation Oncology

## 2013-08-20 ENCOUNTER — Ambulatory Visit
Admission: RE | Admit: 2013-08-20 | Discharge: 2013-08-20 | Disposition: A | Discharge status: Home / Self Care | Payer: Medicare Other | Source: Ambulatory Visit | Attending: Radiation Oncology | Admitting: Radiation Oncology

## 2013-08-21 ENCOUNTER — Ambulatory Visit: Payer: Medicare Other | Admitting: Nutrition

## 2013-08-21 ENCOUNTER — Other Ambulatory Visit: Payer: Self-pay | Admitting: Oncology

## 2013-08-21 ENCOUNTER — Ambulatory Visit
Admission: RE | Admit: 2013-08-21 | Discharge: 2013-08-21 | Disposition: A | Discharge status: Home / Self Care | Payer: Medicare Other | Source: Ambulatory Visit | Attending: Radiation Oncology | Admitting: Radiation Oncology

## 2013-08-21 ENCOUNTER — Other Ambulatory Visit (HOSPITAL_BASED_OUTPATIENT_CLINIC_OR_DEPARTMENT_OTHER): Payer: Medicare Other

## 2013-08-21 ENCOUNTER — Ambulatory Visit (HOSPITAL_BASED_OUTPATIENT_CLINIC_OR_DEPARTMENT_OTHER): Payer: Medicare Other | Admitting: Nurse Practitioner

## 2013-08-21 ENCOUNTER — Ambulatory Visit (HOSPITAL_BASED_OUTPATIENT_CLINIC_OR_DEPARTMENT_OTHER): Payer: Medicare Other

## 2013-08-21 ENCOUNTER — Telehealth: Payer: Self-pay | Admitting: Oncology

## 2013-08-21 VITALS — BP 108/59 | HR 65 | Temp 97.5°F | Resp 18 | Ht 68.0 in | Wt 164.2 lb

## 2013-08-21 DIAGNOSIS — Z5111 Encounter for antineoplastic chemotherapy: Secondary | ICD-10-CM

## 2013-08-21 DIAGNOSIS — Z86010 Personal history of colonic polyps: Secondary | ICD-10-CM

## 2013-08-21 DIAGNOSIS — C7A1 Malignant poorly differentiated neuroendocrine tumors: Secondary | ICD-10-CM

## 2013-08-21 DIAGNOSIS — C155 Malignant neoplasm of lower third of esophagus: Secondary | ICD-10-CM

## 2013-08-21 DIAGNOSIS — C159 Malignant neoplasm of esophagus, unspecified: Secondary | ICD-10-CM

## 2013-08-21 LAB — CBC WITH DIFFERENTIAL/PLATELET
BASO%: 0 % (ref 0.0–2.0)
Eosinophils Absolute: 0 10*3/uL (ref 0.0–0.5)
MCHC: 32.6 g/dL (ref 32.0–36.0)
MCV: 87.6 fL (ref 79.3–98.0)
MONO#: 0.4 10*3/uL (ref 0.1–0.9)
MONO%: 7.8 % (ref 0.0–14.0)
NEUT#: 3.8 10*3/uL (ref 1.5–6.5)
RBC: 2.98 10*6/uL — ABNORMAL LOW (ref 4.20–5.82)
RDW: 13.6 % (ref 11.0–14.6)
WBC: 4.6 10*3/uL (ref 4.0–10.3)
nRBC: 0 % (ref 0–0)

## 2013-08-21 LAB — COMPREHENSIVE METABOLIC PANEL (CC13)
ALT: 6 U/L (ref 0–55)
AST: 12 U/L (ref 5–34)
CO2: 24 mEq/L (ref 22–29)
Sodium: 134 mEq/L — ABNORMAL LOW (ref 136–145)
Total Bilirubin: 0.6 mg/dL (ref 0.20–1.20)
Total Protein: 6.4 g/dL (ref 6.4–8.3)

## 2013-08-21 MED ORDER — ONDANSETRON 8 MG/NS 50 ML IVPB
INTRAVENOUS | Status: AC
  Filled 2013-08-21: qty 8

## 2013-08-21 MED ORDER — DEXAMETHASONE SODIUM PHOSPHATE 10 MG/ML IJ SOLN
INTRAMUSCULAR | Status: AC
  Filled 2013-08-21: qty 1

## 2013-08-21 MED ORDER — SODIUM CHLORIDE 0.9 % IV SOLN
187.6000 mg | Freq: Once | INTRAVENOUS | Status: AC
  Administered 2013-08-21: 190 mg via INTRAVENOUS
  Filled 2013-08-21: qty 19

## 2013-08-21 MED ORDER — SODIUM CHLORIDE 0.9 % IV SOLN
Freq: Once | INTRAVENOUS | Status: AC
  Administered 2013-08-21: 14:00:00 via INTRAVENOUS

## 2013-08-21 MED ORDER — ONDANSETRON 8 MG/50ML IVPB (CHCC)
8.0000 mg | Freq: Once | INTRAVENOUS | Status: AC
  Administered 2013-08-21: 8 mg via INTRAVENOUS

## 2013-08-21 MED ORDER — DEXAMETHASONE SODIUM PHOSPHATE 10 MG/ML IJ SOLN
10.0000 mg | Freq: Once | INTRAMUSCULAR | Status: AC
  Administered 2013-08-21: 10 mg via INTRAVENOUS

## 2013-08-21 NOTE — Patient Instructions (Signed)
[facility] Discharge Instructions for Patients Receiving Chemotherapy  Today you received the following chemotherapy agents Carboplatin.   To help prevent nausea and vomiting after your treatment, we encourage you to take your nausea medication Compazine 10 mg every 6 hours as needed for nausea  If you develop nausea and vomiting that is not controlled by your nausea medication, call the clinic.   BELOW ARE SYMPTOMS THAT SHOULD BE REPORTED IMMEDIATELY:  *FEVER GREATER THAN 100.5 F  *CHILLS WITH OR WITHOUT FEVER  NAUSEA AND VOMITING THAT IS NOT CONTROLLED WITH YOUR NAUSEA MEDICATION  *UNUSUAL SHORTNESS OF BREATH  *UNUSUAL BRUISING OR BLEEDING  TENDERNESS IN MOUTH AND THROAT WITH OR WITHOUT PRESENCE OF ULCERS  *URINARY PROBLEMS  *BOWEL PROBLEMS  UNUSUAL RASH Items with * indicate a potential emergency and should be followed up as soon as possible.  Feel free to call the clinic you have any questions or concerns. The clinic phone number is [phone].

## 2013-08-21 NOTE — Telephone Encounter (Signed)
gv adn printed appt sched and avs for pt for Sept and OCT.... °

## 2013-08-21 NOTE — Progress Notes (Signed)
Patient feels well.  He is eating better.  He denies problems swallowing at this time.  His weight is stable and was documented as 163.7 pounds September 19.  Patient has tried oral nutrition supplements but does not care for them.  Nutrition diagnosis: Inadequate oral intake continues but has improved.  Intervention: Patient was provided with support and encouragement to continue increased oral intake.  He was educated on other foods and beverages to consume since he does not care for oral nutrition supplements.  Teach back method used.  Monitoring, evaluation, goals: Patient has been able to increase calories and protein, and has had weight stabilization.  Next visit: Thursday, October 2.  In the chemotherapy room

## 2013-08-21 NOTE — Progress Notes (Signed)
OFFICE PROGRESS NOTE  Interval history:  [name] is a 72 year old man with locally advanced high-grade neuroendocrine esophagus cancer. He began radiation on 08/06/2013 and weekly carboplatin on 08/07/2013. He is seen today for scheduled followup.  He reports he is feeling better. Swallowing is significantly improved. He has had no weight loss for the past 2 weeks. He denies odynophagia. No nausea or vomiting. No mouth sores. He has occasional loose stools. He denies any bleeding. No skin rash.  Several days ago he had an episode of "sharp chest pain". The pain lasted approximately 10 minutes and resolved without intervention. There was no associated shortness of breath, diaphoresis or jaw pain. He was watching television when the pain occurred.  He reports his wife is recovering from back surgery. She however was recently hospitalized with "pancreatitis and a urine infection".   Objective: Blood pressure 108/59, pulse 65, temperature 97.5 F (36.4 C), temperature source Oral, resp. rate 18, height 5\' 8"  (1.727 m), weight 164 lb 3.2 oz (74.481 kg).  Oropharynx is without thrush or ulceration. Lungs are clear. Regular cardiac rhythm. Abdomen is soft. No hepatomegaly. Trace pitting edema at the lower leg/ankles. Calves nontender. Chronic venous stasis changes at the lower legs bilaterally right greater than left. He is alert and oriented. Gait is normal. No skin rash.  Lab Results: Lab Results  Component Value Date   WBC 4.6 08/21/2013   HGB 8.5* 08/21/2013   HCT 26.1* 08/21/2013   MCV 87.6 08/21/2013   PLT 144 08/21/2013    Chemistry:    Chemistry      Component Value Date/Time   NA 136 07/23/2013 0932   NA 135 07/04/2013 0932   K 4.5 07/23/2013 0932   K 4.7 07/04/2013 0932   CL 104 07/04/2013 0932   CO2 24 07/23/2013 0932   CO2 25 07/04/2013 0932   BUN 11.3 07/23/2013 0932   BUN 14 07/04/2013 0932   CREATININE 1.1 07/23/2013 0932   CREATININE 1.0 07/04/2013 0932      Component Value  Date/Time   CALCIUM 9.2 07/23/2013 0932   CALCIUM 8.9 07/04/2013 0932   ALKPHOS 77 07/23/2013 0932   ALKPHOS 61 01/03/2013 0859   AST 15 07/23/2013 0932   AST 19 01/03/2013 0859   ALT 10 07/23/2013 0932   ALT 14 01/03/2013 0859   BILITOT 1.32* 07/23/2013 0932   BILITOT 1.0 01/03/2013 0859       Studies/Results: Nm Pet Image Initial (pi) Skull Base To Thigh  07/23/2013   *RADIOLOGY REPORT*  Clinical Data: Initial treatment strategy for esophageal cancer.  NUCLEAR MEDICINE PET SKULL BASE TO THIGH  Fasting Blood Glucose:  191  Technique:  20.4 mCi F-18 FDG was injected intravenously. CT data was obtained and used for attenuation correction and anatomic localization only.  (This was not acquired as a diagnostic CT examination.) Additional exam technical data entered on technologist worksheet.  Comparison:  CT scan is 07/11/2013  Findings:  Neck: No hypermetabolic lymph nodes in the neck.  Chest:  Diffuse FDG uptake in the large esophageal mass with SUV max of 6.4.  No mediastinal or hilar lymphadenopathy or abnormal metabolic activity.  Small pulmonary nodules demonstrated on the recent chest CT but no abnormal metabolic activity.  Abdomen/Pelvis:  As demonstrated on the CT scan there is bulky gastrohepatic ligament and periportal lymphadenopathy along with borderline enlarged celiac axis lymph nodes.  SUV Max is 6.4.  No other significant findings in the abdomen/pelvis.  Additional abdominal findings include gallstones, right  renal calculus, advanced atherosclerotic calcifications involving the aorta and branch vessels and prostate gland enlargement.  Skeleton:  No focal hypermetabolic activity to suggest skeletal metastasis.  IMPRESSION:  1.  Large distal esophageal mass with neoplastic range FDG uptake. 2.  Metastatic upper abdominal lymphadenopathy. 3.  No definite hepatic metastatic disease. 4.  No adenopathy in the chest.  Indeterminate small pulmonary nodules.   Original Report Authenticated By: Rudie Meyer,  M.D.    Medications: I have reviewed the patient's current medications.  Assessment/Plan:  1. High-grade neuroendocrine carcinoma of the esophagus, clinical stage III. Staging PET scan 07/23/2013 showed a large distal esophageal mass with neoplastic range FDG uptake, metastatic upper abdominal lymphadenopathy, indeterminate small pulmonary nodules. -initiation of radiation on 08/06/2013.  -Initiation of weekly carboplatin 08/07/2013.  2. Dysphagia secondary to #1. Improved. 3. History of coronary artery disease. 4. Congestive heart failure. 5. Diabetes. 6. Weight loss. Weight has stabilized. Oral intake is better. 7. Mild hypotension 08/07/2013. Blood pressure is better today. 8. Single episode of chest pain lasting approximately 10 minutes on 08/19/2013. Unlikely radiation esophagitis due to quick resolution. Question of esophageal spasm, question cardiac. He will contact his cardiologist if he continues to experience similar pain. 9. Anemia. Progressive. We will continue to monitor.  Disposition-Mr. [name] appears improved. He is tolerating the radiation and chemotherapy well thus far. He has noted significant improvement in swallowing.   Plan to continue weekly carboplatin during the course of radiation. He will return for a followup visit on 09/02/2013. He will contact the office in the interim with any problems.    Plan reviewed with Dr. Truett Perna.  Lonna Cobb ANP/GNP-BC

## 2013-08-22 ENCOUNTER — Ambulatory Visit
Admission: RE | Admit: 2013-08-22 | Discharge: 2013-08-22 | Disposition: A | Discharge status: Home / Self Care | Payer: Medicare Other | Source: Ambulatory Visit | Attending: Radiation Oncology | Admitting: Radiation Oncology

## 2013-08-22 ENCOUNTER — Encounter: Payer: Self-pay | Admitting: Radiation Oncology

## 2013-08-22 VITALS — BP 102/43 | HR 59 | Temp 97.4°F | Resp 20 | Wt 163.7 lb

## 2013-08-22 DIAGNOSIS — C155 Malignant neoplasm of lower third of esophagus: Secondary | ICD-10-CM

## 2013-08-22 NOTE — Progress Notes (Signed)
Department of Radiation Oncology  Phone:  [phone] Fax:        [phone]  Weekly Treatment Note    Name: Billy Bush Date: 08/22/2013 MRN: [id number] DOB: 07/21/1941   Current dose: 21.6 Gy  Current fraction: 12   MEDICATIONS: Current Outpatient Prescriptions  Medication Sig Dispense Refill  . aspirin 325 MG EC tablet Take 325 mg by mouth daily.        Marland Kitchen atorvastatin (LIPITOR) 80 MG tablet TAKE 1 TABLET AT BEDTIME  90 tablet  0  . carvedilol (COREG) 12.5 MG tablet 2 (two) times daily with a meal.       . citalopram (CELEXA) 10 MG tablet Take 1 tablet (10 mg total) by mouth daily.  90 tablet  2  . clopidogrel (PLAVIX) 75 MG tablet TAKE 1 TABLET BY MOUTH ONCE DAILY  90 tablet  2  . co-enzyme Q-10 30 MG capsule Take 30 mg by mouth daily.        Marland Kitchen emollient (BIAFINE) cream Apply 1 application topically daily. Apply  To affected skin areaafter rad txs daily and prn, just not 4 hours prior to rad txs      . furosemide (LASIX) 80 MG tablet Take 80 mg by mouth daily.       Marland Kitchen glucose blood (ONE TOUCH ULTRA TEST) test strip Use as directed twice daily dx 250.01  200 each  3  . ibuprofen (ADVIL,MOTRIN) 200 MG tablet Take 200 mg by mouth every 8 (eight) hours as needed for pain.      Marland Kitchen insulin lispro (HUMALOG) 100 UNIT/ML injection Inject into the skin 3x a day (just before each meal) 11-04-27 units  15 mL  2  . Insulin Pen Needle (BD PEN NEEDLE NANO U/F) 32G X 4 MM MISC Use as directed four times daily  400 each  3  . LORazepam (ATIVAN) 1 MG tablet Take 0.5-1 mg by mouth every 6 (six) hours as needed (nausea).      . Multiple Vitamin (MULTIVITAMIN) tablet Take 1 tablet by mouth daily.        . nitroGLYCERIN (NITROSTAT) 0.4 MG SL tablet Place 1 tablet (0.4 mg total) under the tongue every 5 (five) minutes as needed for chest pain.  25 tablet  3  . pantoprazole (PROTONIX) 40 MG tablet Take 1 tablet (40 mg total) by mouth daily.  90 tablet  3  . prochlorperazine (COMPAZINE) 10 MG  tablet Take 1 tablet (10 mg total) by mouth every 6 (six) hours as needed.  60 tablet  2  . ramipril (ALTACE) 5 MG capsule TAKE 1 CAPSULE BY MOUTH DAILY  90 capsule  3  . spironolactone (ALDACTONE) 25 MG tablet 1/2 tablet daily      . sucralfate (CARAFATE) 1 G tablet Take 1 tablet (1 g total) by mouth 4 (four) times daily.  120 tablet  2   No current facility-administered medications for this encounter.     ALLERGIES: Review of patient's allergies indicates no known allergies.   LABORATORY DATA:  Lab Results  Component Value Date   WBC 4.6 08/21/2013   HGB 8.5* 08/21/2013   HCT 26.1* 08/21/2013   MCV 87.6 08/21/2013   PLT 144 08/21/2013   Lab Results  Component Value Date   NA 134* 08/21/2013   K 3.7 08/21/2013   CL 104 07/04/2013   CO2 24 08/21/2013   Lab Results  Component Value Date   ALT 6 08/21/2013   AST 12 08/21/2013  ALKPHOS 65 08/21/2013   BILITOT 0.60 08/21/2013     NARRATIVE: Billy Bush was seen today for weekly treatment management. The chart was checked and the patient's films were reviewed. The patient is seen prior to his treatment today. He is swallowing better. No weight loss over the last week.  PHYSICAL EXAMINATION: weight is 163 lb 11.2 oz (74.254 kg). His oral temperature is 97.4 F (36.3 C). His blood pressure is 102/43 and his pulse is 59. His respiration is 20.        ASSESSMENT: The patient is doing satisfactorily with treatment.  PLAN: We will continue with the patient's radiation treatment as planned.

## 2013-08-22 NOTE — Progress Notes (Signed)
Weekly rad tx, esophagus 12 completed, eating better, no difficulty swallowing not taking carafate as yet"I don't need it right now I have it though", no c/o pain, or nausea, energy level has picked up, weight maintained no loss 11:23 AM

## 2013-08-25 ENCOUNTER — Ambulatory Visit
Admission: RE | Admit: 2013-08-25 | Discharge: 2013-08-25 | Disposition: A | Discharge status: Home / Self Care | Payer: Medicare Other | Source: Ambulatory Visit | Attending: Radiation Oncology | Admitting: Radiation Oncology

## 2013-08-26 ENCOUNTER — Ambulatory Visit
Admission: RE | Admit: 2013-08-26 | Discharge: 2013-08-26 | Disposition: A | Discharge status: Home / Self Care | Payer: Medicare Other | Source: Ambulatory Visit | Attending: Radiation Oncology | Admitting: Radiation Oncology

## 2013-08-27 ENCOUNTER — Other Ambulatory Visit: Payer: Self-pay | Admitting: Oncology

## 2013-08-27 ENCOUNTER — Ambulatory Visit
Admission: RE | Admit: 2013-08-27 | Discharge: 2013-08-27 | Disposition: A | Discharge status: Home / Self Care | Payer: Medicare Other | Source: Ambulatory Visit | Attending: Radiation Oncology | Admitting: Radiation Oncology

## 2013-08-28 ENCOUNTER — Ambulatory Visit (HOSPITAL_BASED_OUTPATIENT_CLINIC_OR_DEPARTMENT_OTHER): Payer: Medicare Other

## 2013-08-28 ENCOUNTER — Ambulatory Visit (HOSPITAL_BASED_OUTPATIENT_CLINIC_OR_DEPARTMENT_OTHER): Payer: Medicare Other | Admitting: Lab

## 2013-08-28 ENCOUNTER — Other Ambulatory Visit: Payer: Self-pay | Admitting: *Deleted

## 2013-08-28 ENCOUNTER — Ambulatory Visit: Payer: Medicare Other | Admitting: Nutrition

## 2013-08-28 ENCOUNTER — Ambulatory Visit
Admission: RE | Admit: 2013-08-28 | Discharge: 2013-08-28 | Disposition: A | Discharge status: Home / Self Care | Payer: Medicare Other | Source: Ambulatory Visit | Attending: Radiation Oncology | Admitting: Radiation Oncology

## 2013-08-28 VITALS — BP 101/59 | HR 58 | Temp 97.6°F

## 2013-08-28 DIAGNOSIS — C7A1 Malignant poorly differentiated neuroendocrine tumors: Secondary | ICD-10-CM

## 2013-08-28 DIAGNOSIS — C159 Malignant neoplasm of esophagus, unspecified: Secondary | ICD-10-CM

## 2013-08-28 DIAGNOSIS — C155 Malignant neoplasm of lower third of esophagus: Secondary | ICD-10-CM

## 2013-08-28 DIAGNOSIS — Z5111 Encounter for antineoplastic chemotherapy: Secondary | ICD-10-CM

## 2013-08-28 LAB — CBC WITH DIFFERENTIAL/PLATELET
BASO%: 0.2 % (ref 0.0–2.0)
EOS%: 1.2 % (ref 0.0–7.0)
HCT: 26.7 % — ABNORMAL LOW (ref 38.4–49.9)
LYMPH%: 4.4 % — ABNORMAL LOW (ref 14.0–49.0)
MCH: 28.6 pg (ref 27.2–33.4)
MCHC: 33.3 g/dL (ref 32.0–36.0)
MCV: 85.9 fL (ref 79.3–98.0)
MONO#: 0.5 10*3/uL (ref 0.1–0.9)
MONO%: 11.6 % (ref 0.0–14.0)
NEUT%: 82.6 % — ABNORMAL HIGH (ref 39.0–75.0)
Platelets: 98 10*3/uL — ABNORMAL LOW (ref 140–400)
RBC: 3.11 10*6/uL — ABNORMAL LOW (ref 4.20–5.82)
WBC: 4.1 10*3/uL (ref 4.0–10.3)

## 2013-08-28 MED ORDER — DEXAMETHASONE SODIUM PHOSPHATE 10 MG/ML IJ SOLN
10.0000 mg | Freq: Once | INTRAMUSCULAR | Status: AC
  Administered 2013-08-28: 10 mg via INTRAVENOUS

## 2013-08-28 MED ORDER — SODIUM CHLORIDE 0.9 % IV SOLN
Freq: Once | INTRAVENOUS | Status: AC
  Administered 2013-08-28: 13:00:00 via INTRAVENOUS

## 2013-08-28 MED ORDER — ONDANSETRON 8 MG/NS 50 ML IVPB
INTRAVENOUS | Status: AC
  Filled 2013-08-28: qty 8

## 2013-08-28 MED ORDER — DEXAMETHASONE SODIUM PHOSPHATE 10 MG/ML IJ SOLN
INTRAMUSCULAR | Status: AC
  Filled 2013-08-28: qty 1

## 2013-08-28 MED ORDER — ONDANSETRON 8 MG/50ML IVPB (CHCC)
8.0000 mg | Freq: Once | INTRAVENOUS | Status: AC
  Administered 2013-08-28: 8 mg via INTRAVENOUS

## 2013-08-28 MED ORDER — SODIUM CHLORIDE 0.9 % IV SOLN
187.6000 mg | Freq: Once | INTRAVENOUS | Status: AC
  Administered 2013-08-28: 190 mg via INTRAVENOUS
  Filled 2013-08-28: qty 19

## 2013-08-28 NOTE — Progress Notes (Signed)
Patient reports he is eating well.  His weight was stable and documented as 163.7 pounds on September 26.  Patient states he knows he needs to focus on protein foods.  He also is really working to increase his overall hydration.  He reports he had an episode of 2 stools, which he describes as diarrhea.  He states this has not been an ongoing problem.  Nutrition diagnosis: Inadequate oral intake has improved.  Intervention: Patient was educated to continue strategies for increased calories and protein throughout the day.  He was again educated on ways to increase overall fluid intake.  Patient had no questions.  Teach back method used.  Monitoring, evaluation, goals: Patient will continue high-calorie, high-protein diet for weight stabilization.  Next visit: No followup scheduled.  Patient will contact me for questions or concerns.

## 2013-08-28 NOTE — Patient Instructions (Addendum)
[facility] Discharge Instructions for Patients Receiving Chemotherapy  Today you received the following chemotherapy agents: carboplatin  To help prevent nausea and vomiting after your treatment, we encourage you to take your nausea medication as prescribed.    If you develop nausea and vomiting that is not controlled by your nausea medication, call the clinic.   BELOW ARE SYMPTOMS THAT SHOULD BE REPORTED IMMEDIATELY:  *FEVER GREATER THAN 100.5 F  *CHILLS WITH OR WITHOUT FEVER  NAUSEA AND VOMITING THAT IS NOT CONTROLLED WITH YOUR NAUSEA MEDICATION  *UNUSUAL SHORTNESS OF BREATH  *UNUSUAL BRUISING OR BLEEDING  TENDERNESS IN MOUTH AND THROAT WITH OR WITHOUT PRESENCE OF ULCERS  *URINARY PROBLEMS  *BOWEL PROBLEMS  UNUSUAL RASH Items with * indicate a potential emergency and should be followed up as soon as possible.  Feel free to call the clinic you have any questions or concerns. The clinic phone number is [phone].    

## 2013-08-28 NOTE — Progress Notes (Signed)
Ok to treat per Dr. Sherrill. 

## 2013-08-29 ENCOUNTER — Ambulatory Visit
Admission: RE | Admit: 2013-08-29 | Discharge: 2013-08-29 | Disposition: A | Discharge status: Home / Self Care | Payer: Medicare Other | Source: Ambulatory Visit | Attending: Radiation Oncology | Admitting: Radiation Oncology

## 2013-08-29 ENCOUNTER — Encounter: Payer: Self-pay | Admitting: Radiation Oncology

## 2013-08-29 VITALS — BP 122/69 | HR 65 | Temp 97.7°F | Resp 20 | Wt 159.1 lb

## 2013-08-29 DIAGNOSIS — C155 Malignant neoplasm of lower third of esophagus: Secondary | ICD-10-CM

## 2013-08-29 NOTE — Progress Notes (Signed)
Department of Radiation Oncology  Phone:  [phone] Fax:        [phone]  Weekly Treatment Note    Name: Billy Bush Date: 08/29/2013 MRN: [id number] DOB: 1941/09/18   Current dose: 32.4 Gy  Current fraction: 18   MEDICATIONS: Current Outpatient Prescriptions  Medication Sig Dispense Refill  . aspirin 325 MG EC tablet Take 325 mg by mouth daily.        Marland Kitchen atorvastatin (LIPITOR) 80 MG tablet TAKE 1 TABLET AT BEDTIME  90 tablet  0  . carvedilol (COREG) 12.5 MG tablet 2 (two) times daily with a meal.       . citalopram (CELEXA) 10 MG tablet Take 1 tablet (10 mg total) by mouth daily.  90 tablet  2  . clopidogrel (PLAVIX) 75 MG tablet TAKE 1 TABLET BY MOUTH ONCE DAILY  90 tablet  2  . co-enzyme Q-10 30 MG capsule Take 30 mg by mouth daily.        Marland Kitchen emollient (BIAFINE) cream Apply 1 application topically daily. Apply  To affected skin areaafter rad txs daily and prn, just not 4 hours prior to rad txs      . furosemide (LASIX) 80 MG tablet Take 80 mg by mouth daily.       Marland Kitchen glucose blood (ONE TOUCH ULTRA TEST) test strip Use as directed twice daily dx 250.01  200 each  3  . ibuprofen (ADVIL,MOTRIN) 200 MG tablet Take 200 mg by mouth every 8 (eight) hours as needed for pain.      Marland Kitchen insulin lispro (HUMALOG) 100 UNIT/ML injection Inject into the skin 3x a day (just before each meal) 11-04-27 units  15 mL  2  . Insulin Pen Needle (BD PEN NEEDLE NANO U/F) 32G X 4 MM MISC Use as directed four times daily  400 each  3  . LORazepam (ATIVAN) 1 MG tablet Take 0.5-1 mg by mouth every 6 (six) hours as needed (nausea).      . Multiple Vitamin (MULTIVITAMIN) tablet Take 1 tablet by mouth daily.        . nitroGLYCERIN (NITROSTAT) 0.4 MG SL tablet Place 1 tablet (0.4 mg total) under the tongue every 5 (five) minutes as needed for chest pain.  25 tablet  3  . pantoprazole (PROTONIX) 40 MG tablet Take 1 tablet (40 mg total) by mouth daily.  90 tablet  3  . prochlorperazine (COMPAZINE) 10 MG  tablet Take 1 tablet (10 mg total) by mouth every 6 (six) hours as needed.  60 tablet  2  . ramipril (ALTACE) 5 MG capsule TAKE 1 CAPSULE BY MOUTH DAILY  90 capsule  3  . spironolactone (ALDACTONE) 25 MG tablet 1/2 tablet daily      . sucralfate (CARAFATE) 1 G tablet Take 1 tablet (1 g total) by mouth 4 (four) times daily.  120 tablet  2   No current facility-administered medications for this encounter.     ALLERGIES: Review of patient's allergies indicates no known allergies.   LABORATORY DATA:  Lab Results  Component Value Date   WBC 4.1 08/28/2013   HGB 8.9* 08/28/2013   HCT 26.7* 08/28/2013   MCV 85.9 08/28/2013   PLT 98* 08/28/2013   Lab Results  Component Value Date   NA 134* 08/21/2013   K 3.7 08/21/2013   CL 104 07/04/2013   CO2 24 08/21/2013   Lab Results  Component Value Date   ALT 6 08/21/2013   AST 12 08/21/2013  ALKPHOS 65 08/21/2013   BILITOT 0.60 08/21/2013     NARRATIVE: Billy Bush was seen today for weekly treatment management. The chart was checked and the patient's films were reviewed. The patient is swallowing much better. No real irritative symptoms at this point although he does have Carafate if needed. His by mouth intake is increasing.  PHYSICAL EXAMINATION: weight is 159 lb 1.6 oz (72.167 kg). His oral temperature is 97.7 F (36.5 C). His blood pressure is 122/69 and his pulse is 65. His respiration is 20.        ASSESSMENT: The patient is doing satisfactorily with treatment.  PLAN: We will continue with the patient's radiation treatment as planned.

## 2013-08-29 NOTE — Progress Notes (Signed)
Weekly rad tx 18 esohagus completed, slight erythema using biafine cream bid, swallowing better, no c/o apin, did have slight sore throat  Earlier, had 1 pork chop for lunch with salad and b. potatoe yesterday,dinner had bowl beef veg soup for dinner, oatmeal this anm with zuchini bread, coffee, drinks milk, juice ,no nausea,  Energy level goes down later in the day 11:51 AM

## 2013-08-31 ENCOUNTER — Other Ambulatory Visit: Payer: Self-pay | Admitting: Oncology

## 2013-09-01 ENCOUNTER — Ambulatory Visit
Admission: RE | Admit: 2013-09-01 | Discharge: 2013-09-01 | Disposition: A | Discharge status: Home / Self Care | Payer: Medicare Other | Source: Ambulatory Visit | Attending: Radiation Oncology | Admitting: Radiation Oncology

## 2013-09-02 ENCOUNTER — Ambulatory Visit (HOSPITAL_BASED_OUTPATIENT_CLINIC_OR_DEPARTMENT_OTHER): Payer: Medicare Other | Admitting: Oncology

## 2013-09-02 ENCOUNTER — Ambulatory Visit
Admission: RE | Admit: 2013-09-02 | Discharge: 2013-09-02 | Disposition: A | Discharge status: Home / Self Care | Payer: Medicare Other | Source: Ambulatory Visit | Attending: Radiation Oncology | Admitting: Radiation Oncology

## 2013-09-02 ENCOUNTER — Telehealth: Payer: Self-pay | Admitting: Oncology

## 2013-09-02 VITALS — BP 97/56 | HR 65 | Temp 96.9°F | Resp 18 | Ht 68.0 in | Wt 155.6 lb

## 2013-09-02 DIAGNOSIS — R911 Solitary pulmonary nodule: Secondary | ICD-10-CM

## 2013-09-02 DIAGNOSIS — C159 Malignant neoplasm of esophagus, unspecified: Secondary | ICD-10-CM

## 2013-09-02 DIAGNOSIS — C7A1 Malignant poorly differentiated neuroendocrine tumors: Secondary | ICD-10-CM

## 2013-09-02 DIAGNOSIS — I959 Hypotension, unspecified: Secondary | ICD-10-CM

## 2013-09-02 DIAGNOSIS — R634 Abnormal weight loss: Secondary | ICD-10-CM

## 2013-09-02 DIAGNOSIS — D6481 Anemia due to antineoplastic chemotherapy: Secondary | ICD-10-CM

## 2013-09-02 DIAGNOSIS — E46 Unspecified protein-calorie malnutrition: Secondary | ICD-10-CM

## 2013-09-02 DIAGNOSIS — C7B8 Other secondary neuroendocrine tumors: Secondary | ICD-10-CM

## 2013-09-02 NOTE — Progress Notes (Signed)
   [facility]    OFFICE PROGRESS NOTE   INTERVAL HISTORY:   Billy Bush continues daily radiation. He was last treated with carboplatin on 08/28/2013. He reports tolerating the chemotherapy well. No nausea/vomiting, mouth sores, or pain with swallowing. He is eating steak, ribs, and a chicken weeks. He does not like the taste of nutrition supplements.  Objective:  Vital signs in last 24 hours:  Blood pressure 97/56, pulse 65, temperature 96.9 F (36.1 C), temperature source Oral, resp. rate 18, height 5\' 8"  (1.727 m), weight 155 lb 9.6 oz (70.58 kg).    HEENT: No thrush or ulcers Resp: Lungs clear bilaterally Cardio: Regular rate and rhythm GI: No hepatomegaly, nontender, no mass Vascular: No leg edema   Lab Results:  Lab Results  Component Value Date   WBC 4.1 08/28/2013   HGB 8.9* 08/28/2013   HCT 26.7* 08/28/2013   MCV 85.9 08/28/2013   PLT 98* 08/28/2013   ANC 3.4   Medications: I have reviewed the patient's current medications.  Assessment/Plan: 1. High-grade neuroendocrine carcinoma of the esophagus, clinical stage III. Staging PET scan 07/23/2013 showed a large distal esophageal mass with neoplastic range FDG uptake, metastatic upper abdominal lymphadenopathy, indeterminate small pulmonary nodules. -initiation of radiation on 08/06/2013.  -Initiation of weekly carboplatin 08/07/2013.  2. Dysphagia secondary to #1. Improved. 3. History of coronary artery disease. 4. Congestive heart failure. 5. Diabetes. 6. Weight loss. He continues to lose weight, but is now tolerating a regular diet 7. Mild hypotension  8. Anemia secondary to malnutrition and chemotherapy, stable 9. Thrombocytopenia secondary to chemotherapy  Disposition:  Billy Bush appears to be tolerating the chemotherapy and radiation well. We will check a CBC on 09/04/2013 and dose reduce or hold the carboplatin as indicated. He is scheduled to complete radiation on 09/12/2013. He will  return for an office visit on 09/24/2013.  I encouraged him to increase his calorie intake. He plans to try [employer]. He will meet with the cancer Center nutritionist.   Thornton Papas, MD  09/02/2013  5:18 PM

## 2013-09-02 NOTE — Telephone Encounter (Signed)
Gave pt pt apt for lab and ML on October 2014

## 2013-09-03 ENCOUNTER — Ambulatory Visit
Admission: RE | Admit: 2013-09-03 | Discharge: 2013-09-03 | Disposition: A | Discharge status: Home / Self Care | Payer: Medicare Other | Source: Ambulatory Visit | Attending: Radiation Oncology | Admitting: Radiation Oncology

## 2013-09-03 VITALS — BP 93/65 | HR 66 | Temp 97.5°F | Wt 156.2 lb

## 2013-09-03 DIAGNOSIS — C155 Malignant neoplasm of lower third of esophagus: Secondary | ICD-10-CM

## 2013-09-03 MED ORDER — BIAFINE EX EMUL
CUTANEOUS | Status: DC | PRN
  Administered 2013-09-03: 14:00:00 via TOPICAL

## 2013-09-03 NOTE — Progress Notes (Signed)
Patient here for weekly assessment of radiation for esophageal cancer.Completed 21 of 28 treatments.Has one more chemotherapy treatment scheduled.Denies pain.Eats soft foods ok.Has occasional diarrhea but states it is just light not requiring imodium ad.Requests another tube of biafine.

## 2013-09-03 NOTE — Progress Notes (Signed)
Department of Radiation Oncology  Phone:  [phone] Fax:        [phone]  Weekly Treatment Note    Name: Billy Bush Date: 09/03/2013 MRN: [id number] DOB: May 21, 1941   Current dose: 37.8 Gy  Current fraction: 21   MEDICATIONS: Current Outpatient Prescriptions  Medication Sig Dispense Refill  . aspirin 325 MG EC tablet Take 325 mg by mouth daily.        Marland Kitchen atorvastatin (LIPITOR) 80 MG tablet TAKE 1 TABLET AT BEDTIME  90 tablet  0  . carvedilol (COREG) 12.5 MG tablet 2 (two) times daily with a meal.       . citalopram (CELEXA) 10 MG tablet Take 1 tablet (10 mg total) by mouth daily.  90 tablet  2  . clopidogrel (PLAVIX) 75 MG tablet TAKE 1 TABLET BY MOUTH ONCE DAILY  90 tablet  2  . co-enzyme Q-10 30 MG capsule Take 30 mg by mouth daily.        Marland Kitchen emollient (BIAFINE) cream Apply 1 application topically daily. Apply  To affected skin areaafter rad txs daily and prn, just not 4 hours prior to rad txs      . furosemide (LASIX) 80 MG tablet Take 80 mg by mouth daily.       Marland Kitchen glucose blood (ONE TOUCH ULTRA TEST) test strip Use as directed twice daily dx 250.01  200 each  3  . ibuprofen (ADVIL,MOTRIN) 200 MG tablet Take 200 mg by mouth every 8 (eight) hours as needed for pain.      Marland Kitchen insulin lispro (HUMALOG) 100 UNIT/ML injection Inject into the skin 3x a day (just before each meal) 11-04-27 units  15 mL  2  . Insulin Pen Needle (BD PEN NEEDLE NANO U/F) 32G X 4 MM MISC Use as directed four times daily  400 each  3  . LORazepam (ATIVAN) 1 MG tablet Take 0.5-1 mg by mouth every 6 (six) hours as needed (nausea).      . Multiple Vitamin (MULTIVITAMIN) tablet Take 1 tablet by mouth daily.        . nitroGLYCERIN (NITROSTAT) 0.4 MG SL tablet Place 1 tablet (0.4 mg total) under the tongue every 5 (five) minutes as needed for chest pain.  25 tablet  3  . pantoprazole (PROTONIX) 40 MG tablet Take 1 tablet (40 mg total) by mouth daily.  90 tablet  3  . prochlorperazine (COMPAZINE) 10 MG  tablet Take 1 tablet (10 mg total) by mouth every 6 (six) hours as needed.  60 tablet  2  . ramipril (ALTACE) 5 MG capsule TAKE 1 CAPSULE BY MOUTH DAILY  90 capsule  3  . spironolactone (ALDACTONE) 25 MG tablet 1/2 tablet daily      . sucralfate (CARAFATE) 1 G tablet Take 1 tablet (1 g total) by mouth 4 (four) times daily.  120 tablet  2   No current facility-administered medications for this encounter.     ALLERGIES: Review of patient's allergies indicates no known allergies.   LABORATORY DATA:  Lab Results  Component Value Date   WBC 4.1 08/28/2013   HGB 8.9* 08/28/2013   HCT 26.7* 08/28/2013   MCV 85.9 08/28/2013   PLT 98* 08/28/2013   Lab Results  Component Value Date   NA 134* 08/21/2013   K 3.7 08/21/2013   CL 104 07/04/2013   CO2 24 08/21/2013   Lab Results  Component Value Date   ALT 6 08/21/2013   AST 12 08/21/2013  ALKPHOS 65 08/21/2013   BILITOT 0.60 08/21/2013     NARRATIVE: Billy Bush was seen today for weekly treatment management. The chart was checked and the patient's films were reviewed. The patient states that he is doing very well. He is swallowing continues to be much improved and he is eating a wide variety of foods at this point.  PHYSICAL EXAMINATION: weight is 156 lb 3.2 oz (70.852 kg). His temperature is 97.5 F (36.4 C). His blood pressure is 93/65 and his pulse is 66. His oxygen saturation is 97%.        ASSESSMENT: The patient is doing satisfactorily with treatment.  PLAN: We will continue with the patient's radiation treatment as planned.

## 2013-09-04 ENCOUNTER — Ambulatory Visit
Admission: RE | Admit: 2013-09-04 | Discharge: 2013-09-04 | Disposition: A | Discharge status: Home / Self Care | Payer: Medicare Other | Source: Ambulatory Visit | Attending: Radiation Oncology | Admitting: Radiation Oncology

## 2013-09-04 ENCOUNTER — Ambulatory Visit: Payer: Medicare Other

## 2013-09-04 ENCOUNTER — Other Ambulatory Visit (HOSPITAL_BASED_OUTPATIENT_CLINIC_OR_DEPARTMENT_OTHER): Payer: Medicare Other | Admitting: Lab

## 2013-09-04 ENCOUNTER — Other Ambulatory Visit: Payer: Self-pay | Admitting: *Deleted

## 2013-09-04 DIAGNOSIS — C159 Malignant neoplasm of esophagus, unspecified: Secondary | ICD-10-CM

## 2013-09-04 LAB — CBC WITH DIFFERENTIAL/PLATELET
BASO%: 0.3 % (ref 0.0–2.0)
EOS%: 0.9 % (ref 0.0–7.0)
MCH: 28.8 pg (ref 27.2–33.4)
MCHC: 32.3 g/dL (ref 32.0–36.0)
MONO#: 0.2 10*3/uL (ref 0.1–0.9)
NEUT%: 86.2 % — ABNORMAL HIGH (ref 39.0–75.0)
RBC: 2.85 10*6/uL — ABNORMAL LOW (ref 4.20–5.82)
WBC: 3.3 10*3/uL — ABNORMAL LOW (ref 4.0–10.3)
lymph#: 0.2 10*3/uL — ABNORMAL LOW (ref 0.9–3.3)
nRBC: 0 % (ref 0–0)

## 2013-09-04 NOTE — Progress Notes (Signed)
Per Dr. Truett Perna: Hold chemo this week and reschedule for next week. Recheck counts tomorrow. Patient/son notified.

## 2013-09-05 ENCOUNTER — Ambulatory Visit
Admission: RE | Admit: 2013-09-05 | Discharge: 2013-09-05 | Disposition: A | Discharge status: Home / Self Care | Payer: Medicare Other | Source: Ambulatory Visit | Attending: Radiation Oncology | Admitting: Radiation Oncology

## 2013-09-05 ENCOUNTER — Other Ambulatory Visit (HOSPITAL_BASED_OUTPATIENT_CLINIC_OR_DEPARTMENT_OTHER): Payer: Medicare Other | Admitting: Lab

## 2013-09-05 ENCOUNTER — Telehealth: Payer: Self-pay | Admitting: Oncology

## 2013-09-05 ENCOUNTER — Telehealth: Payer: Self-pay | Admitting: *Deleted

## 2013-09-05 DIAGNOSIS — C159 Malignant neoplasm of esophagus, unspecified: Secondary | ICD-10-CM

## 2013-09-05 LAB — CBC WITH DIFFERENTIAL/PLATELET
Basophils Absolute: 0 10*3/uL (ref 0.0–0.1)
EOS%: 1.5 % (ref 0.0–7.0)
Eosinophils Absolute: 0 10*3/uL (ref 0.0–0.5)
HGB: 8.2 g/dL — ABNORMAL LOW (ref 13.0–17.1)
LYMPH%: 7.6 % — ABNORMAL LOW (ref 14.0–49.0)
MCH: 28.8 pg (ref 27.2–33.4)
MCV: 87.4 fL (ref 79.3–98.0)
MONO%: 6.1 % (ref 0.0–14.0)
NEUT#: 1.7 10*3/uL (ref 1.5–6.5)
NEUT%: 84.3 % — ABNORMAL HIGH (ref 39.0–75.0)
Platelets: 48 10*3/uL — ABNORMAL LOW (ref 140–400)
RBC: 2.85 10*6/uL — ABNORMAL LOW (ref 4.20–5.82)
RDW: 15.7 % — ABNORMAL HIGH (ref 11.0–14.6)
nRBC: 0 % (ref 0–0)

## 2013-09-05 NOTE — Telephone Encounter (Signed)
Per staff message and POF I have scheduled appts.  JMW  

## 2013-09-08 ENCOUNTER — Ambulatory Visit
Admission: RE | Admit: 2013-09-08 | Discharge: 2013-09-08 | Disposition: A | Discharge status: Home / Self Care | Payer: Medicare Other | Source: Ambulatory Visit | Attending: Radiation Oncology | Admitting: Radiation Oncology

## 2013-09-08 ENCOUNTER — Other Ambulatory Visit: Payer: Self-pay | Admitting: Endocrinology

## 2013-09-08 NOTE — Telephone Encounter (Signed)
Please refill x 1 Ov is due  

## 2013-09-09 ENCOUNTER — Ambulatory Visit
Admission: RE | Admit: 2013-09-09 | Discharge: 2013-09-09 | Disposition: A | Discharge status: Home / Self Care | Payer: Medicare Other | Source: Ambulatory Visit | Attending: Radiation Oncology | Admitting: Radiation Oncology

## 2013-09-09 ENCOUNTER — Other Ambulatory Visit: Payer: Self-pay | Admitting: *Deleted

## 2013-09-09 MED ORDER — CITALOPRAM HYDROBROMIDE 10 MG PO TABS: 10.0000 mg | ORAL_TABLET | Freq: Every day | ORAL | Status: DC

## 2013-09-10 ENCOUNTER — Telehealth: Payer: Self-pay | Admitting: *Deleted

## 2013-09-10 ENCOUNTER — Ambulatory Visit
Admission: RE | Admit: 2013-09-10 | Discharge: 2013-09-10 | Disposition: A | Discharge status: Home / Self Care | Payer: Medicare Other | Source: Ambulatory Visit | Attending: Radiation Oncology | Admitting: Radiation Oncology

## 2013-09-10 ENCOUNTER — Other Ambulatory Visit: Payer: Self-pay

## 2013-09-10 MED ORDER — ATORVASTATIN CALCIUM 80 MG PO TABS: ORAL_TABLET | ORAL | Status: DC

## 2013-09-10 NOTE — Telephone Encounter (Signed)
Patient wife need mail order prescription sent to [facility] for Spironolactone 25 mg daily. Appointment  with Dr.Mclean 09/29/13 @ 9:15. Former pt of [facility].

## 2013-09-11 ENCOUNTER — Other Ambulatory Visit: Payer: Self-pay

## 2013-09-11 ENCOUNTER — Ambulatory Visit (HOSPITAL_BASED_OUTPATIENT_CLINIC_OR_DEPARTMENT_OTHER): Payer: Medicare Other

## 2013-09-11 ENCOUNTER — Ambulatory Visit
Admission: RE | Admit: 2013-09-11 | Discharge: 2013-09-11 | Disposition: A | Discharge status: Home / Self Care | Payer: Medicare Other | Source: Ambulatory Visit | Attending: Radiation Oncology | Admitting: Radiation Oncology

## 2013-09-11 ENCOUNTER — Other Ambulatory Visit (HOSPITAL_BASED_OUTPATIENT_CLINIC_OR_DEPARTMENT_OTHER): Payer: Medicare Other | Admitting: Lab

## 2013-09-11 ENCOUNTER — Telehealth: Payer: Self-pay | Admitting: Oncology

## 2013-09-11 VITALS — BP 100/56 | HR 64 | Temp 97.6°F

## 2013-09-11 DIAGNOSIS — I2510 Atherosclerotic heart disease of native coronary artery without angina pectoris: Secondary | ICD-10-CM

## 2013-09-11 DIAGNOSIS — C7A1 Malignant poorly differentiated neuroendocrine tumors: Secondary | ICD-10-CM

## 2013-09-11 DIAGNOSIS — C159 Malignant neoplasm of esophagus, unspecified: Secondary | ICD-10-CM

## 2013-09-11 DIAGNOSIS — R5381 Other malaise: Secondary | ICD-10-CM

## 2013-09-11 DIAGNOSIS — I2589 Other forms of chronic ischemic heart disease: Secondary | ICD-10-CM

## 2013-09-11 DIAGNOSIS — C7B8 Other secondary neuroendocrine tumors: Secondary | ICD-10-CM

## 2013-09-11 LAB — CBC WITH DIFFERENTIAL/PLATELET
Basophils Absolute: 0 10*3/uL (ref 0.0–0.1)
Eosinophils Absolute: 0 10*3/uL (ref 0.0–0.5)
HCT: 25.3 % — ABNORMAL LOW (ref 38.4–49.9)
MCV: 91 fL (ref 79.3–98.0)
MONO%: 13.6 % (ref 0.0–14.0)
NEUT%: 72 % (ref 39.0–75.0)
RDW: 18.2 % — ABNORMAL HIGH (ref 11.0–14.6)
WBC: 1.3 10*3/uL — ABNORMAL LOW (ref 4.0–10.3)
lymph#: 0.1 10*3/uL — ABNORMAL LOW (ref 0.9–3.3)
nRBC: 0 % (ref 0–0)

## 2013-09-11 LAB — TECHNOLOGIST REVIEW

## 2013-09-11 MED ORDER — SPIRONOLACTONE 25 MG PO TABS: ORAL_TABLET | ORAL | Status: DC

## 2013-09-11 NOTE — Patient Instructions (Signed)
Neutropenia Neutropenia is a condition that occurs when the level of a certain type of white blood cell (neutrophil) in your body becomes lower than normal. Neutrophils are made in the bone marrow and fight infections. These cells protect against bacteria and viruses. The fewer neutrophils you have, and the longer your body remains without them, the greater your risk of getting a severe infection becomes. CAUSES  The cause of neutropenia may be hard to determine. However, it is usually due to 3 main problems:   Decreased production of neutrophils. This may be due to:  Certain medicines such as chemotherapy.  Genetic problems.  Cancer.  Radiation treatments.  Vitamin deficiency.  Some pesticides.  Increased destruction of neutrophils. This may be due to:  Overwhelming infections.  Hemolytic anemia. This is when the body destroys its own blood cells.  Chemotherapy.  Neutrophils moving to areas of the body where they cannot fight infections. This may be due to:  Dialysis procedures.  Conditions where the spleen becomes enlarged. Neutrophils are held in the spleen and are not available to the rest of the body.  Overwhelming infections. The neutrophils are held in the area of the infection and are not available to the rest of the body. SYMPTOMS  There are no specific symptoms of neutropenia. The lack of neutrophils can result in an infection, and an infection can cause various problems. DIAGNOSIS  Diagnosis is made by a blood test. A complete blood count is performed. The normal level of neutrophils in human blood differs with age and race. Infants have lower counts than older children and adults. African Americans have lower counts than Caucasians or Asians. The average adult level is 1500 cells/mm3 of blood. Neutrophil counts are interpreted as follows:  Greater than 1000 cells/mm3 gives normal protection against infection.  500 to 1000 cells/mm3 gives an increased risk for  infection.  200 to 500 cells/mm3 is a greater risk for severe infection.  Lower than 200 cells/mm3 is a marked risk of infection. This may require hospitalization and treatment with antibiotic medicines. TREATMENT  Treatment depends on the underlying cause, severity, and presence of infections or symptoms. It also depends on your health. Your caregiver will discuss the treatment plan with you. Mild cases are often easily treated and have a good outcome. Preventative measures may also be started to limit your risk of infections. Treatment can include:  Taking antibiotics.  Stopping medicines that are known to cause neutropenia.  Correcting nutritional deficiencies by eating green vegetables to supply folic acid and taking vitamin B supplements.  Stopping exposure to pesticides if your neutropenia is related to pesticide exposure.  Taking a blood growth factor called sargramostim, pegfilgrastim, or filgrastim if you are undergoing chemotherapy for cancer. This stimulates white blood cell production.  Removal of the spleen if you have Felty's syndrome and have repeated infections. HOME CARE INSTRUCTIONS   Follow your caregiver's instructions about when you need to have blood work done.  Wash your hands often. Make sure others who come in contact with you also wash their hands.  Wash raw fruits and vegetables before eating them. They can carry bacteria and fungi.  Avoid people with colds or spreadable (contagious) diseases (chickenpox, herpes zoster, influenza).  Avoid large crowds.  Avoid construction areas. The dust can release fungus into the air.  Be cautious around children in daycare or school environments.  Take care of your respiratory system by coughing and deep breathing.  Bathe daily.  Protect your skin from cuts and   burns.  Do not work in the garden or with flowers and plants.  Care for the mouth before and after meals by brushing with a soft toothbrush. If you have  mucositis, do not use mouthwash. Mouthwash contains alcohol and can dry out the mouth even more.  Clean the area between the genitals and the anus (perineal area) after urination and bowel movements. Women need to wipe from front to back.  Use a water soluble lubricant during sexual intercourse and practice good hygiene after. Do not have intercourse if you are severely neutropenic. Check with your caregiver for guidelines.  Exercise daily as tolerated.  Avoid people who were vaccinated with a live vaccine in the past 30 days. You should not receive live vaccines (polio, typhoid).  Do not provide direct care for pets. Avoid animal droppings. Do not clean litter boxes and bird cages.  Do not share food utensils.  Do not use tampons, enemas, or rectal suppositories unless directed by your caregiver.  Use an electric razor to remove hair.  Wash your hands after handling magazines, letters, and newspapers. SEEK IMMEDIATE MEDICAL CARE IF:   You have a fever.  You have chills or start to shake.  You feel nauseous or vomit.  You develop mouth sores.  You develop aches and pains.  You have redness and swelling around open wounds.  Your skin is warm to the touch.  You have pus coming from your wounds.  You develop swollen lymph nodes.  You feel weak or fatigued.  You develop red streaks on the skin. MAKE SURE YOU:  Understand these instructions.  Will watch your condition.  Will get help right away if you are not doing well or get worse. Document Released: 05/05/2002 Document Revised: 02/05/2012 Document Reviewed: 06/02/2011 [employer] Patient Information 2014 [employer].  

## 2013-09-11 NOTE — Progress Notes (Signed)
1215-WBC-1.3 and Neut-0.9 from today's CBC.  Pt.'s only complaint is fatigue at this time.  Dr. Truett Perna notified and order received to hold chemo for today.  Neutropenic precautions reviewed with patient and his son. Teach back done.  Pt has no questions at this time.  He understands to return on 09/24/13 as scheduled and to call [facility] if needed before that time.

## 2013-09-11 NOTE — Telephone Encounter (Signed)
Pt called and r/s appt to 10/28 lab and ML

## 2013-09-12 ENCOUNTER — Ambulatory Visit
Admission: RE | Admit: 2013-09-12 | Discharge: 2013-09-12 | Disposition: A | Discharge status: Home / Self Care | Payer: Medicare Other | Source: Ambulatory Visit | Attending: Radiation Oncology | Admitting: Radiation Oncology

## 2013-09-12 ENCOUNTER — Encounter: Payer: Self-pay | Admitting: Radiation Oncology

## 2013-09-12 VITALS — BP 106/63 | HR 63 | Temp 97.5°F | Resp 20 | Wt 158.1 lb

## 2013-09-12 DIAGNOSIS — C155 Malignant neoplasm of lower third of esophagus: Secondary | ICD-10-CM

## 2013-09-12 NOTE — Progress Notes (Signed)
Department  of Radiation Oncology  Phone:  [phone] Fax:        [phone]  Weekly Treatment Note    Name: Billy Bush Date: 09/12/2013 MRN: [id number] DOB: [birth year]   Current dose: 50.4  Gy  Current fraction:28   MEDICATIONS: Current Outpatient Prescriptions  Medication Sig Dispense Refill  . aspirin 325 MG EC tablet Take 325 mg by mouth daily.        Marland Kitchen atorvastatin (LIPITOR) 80 MG tablet TAKE 1 TABLET AT BEDTIME  90 tablet  0  . carvedilol (COREG) 12.5 MG tablet 2 (two) times daily with a meal.       . citalopram (CELEXA) 10 MG tablet Take 1 tablet (10 mg total) by mouth daily.  90 tablet  0  . clopidogrel (PLAVIX) 75 MG tablet TAKE 1 TABLET BY MOUTH ONCE DAILY  90 tablet  2  . co-enzyme Q-10 30 MG capsule Take 30 mg by mouth daily.        Marland Kitchen emollient (BIAFINE) cream Apply 1 application topically daily. Apply  To affected skin areaafter rad txs daily and prn, just not 4 hours prior to rad txs      . furosemide (LASIX) 80 MG tablet Take 80 mg by mouth daily.       Marland Kitchen glucose blood (ONE TOUCH ULTRA TEST) test strip Use as directed twice daily dx 250.01  200 each  3  . ibuprofen (ADVIL,MOTRIN) 200 MG tablet Take 200 mg by mouth every 8 (eight) hours as needed for pain.      Marland Kitchen insulin lispro (HUMALOG) 100 UNIT/ML injection Inject into the skin 3x a day (just before each meal) 11-04-27 units  15 mL  2  . Insulin Pen Needle (BD PEN NEEDLE NANO U/F) 32G X 4 MM MISC Use as directed four times daily  400 each  3  . LORazepam (ATIVAN) 1 MG tablet Take 0.5-1 mg by mouth every 6 (six) hours as needed (nausea).      . Multiple Vitamin (MULTIVITAMIN) tablet Take 1 tablet by mouth daily.        . nitroGLYCERIN (NITROSTAT) 0.4 MG SL tablet Place 1 tablet (0.4 mg total) under the tongue every 5 (five) minutes as needed for chest pain.  25 tablet  3  . pantoprazole (PROTONIX) 40 MG tablet Take 1 tablet (40 mg total) by mouth daily.  90 tablet  3  . prochlorperazine (COMPAZINE) 10  MG tablet Take 1 tablet (10 mg total) by mouth every 6 (six) hours as needed.  60 tablet  2  . ramipril (ALTACE) 5 MG capsule TAKE 1 CAPSULE BY MOUTH DAILY  90 capsule  3  . spironolactone (ALDACTONE) 25 MG tablet 1/2 tablet daily  90 tablet  0  . sucralfate (CARAFATE) 1 G tablet Take 1 tablet (1 g total) by mouth 4 (four) times daily.  120 tablet  2   No current facility-administered medications for this encounter.     ALLERGIES: Review of patient's allergies indicates no known allergies.   LABORATORY DATA:  Lab Results  Component Value Date   WBC 1.3* 09/11/2013   HGB 8.1* 09/11/2013   HCT 25.3* 09/11/2013   MCV 91.0 09/11/2013   PLT 119* 09/11/2013   Lab Results  Component Value Date   NA 134* 08/21/2013   K 3.7 08/21/2013   CL 104 07/04/2013   CO2 24 08/21/2013   Lab Results  Component Value Date   ALT 6 08/21/2013   AST 12  08/21/2013   ALKPHOS 65 08/21/2013   BILITOT 0.60 08/21/2013     NARRATIVE: Billy Bush was seen today for weekly treatment management. The chart was checked and the patient's films were reviewed. The patient completed treatment today. Did remarkably well. Swallowing a wide variety of foods.  PHYSICAL EXAMINATION: weight is 158 lb 1.6 oz (71.714 kg). His oral temperature is 97.5 F (36.4 C). His blood pressure is 106/63 and his pulse is 63. His respiration is 20.        ASSESSMENT: The patient did very well with treatment.  PLAN: Follow up in 1 month.

## 2013-09-12 NOTE — Progress Notes (Signed)
Weekly rad tx esophagus  28/28 completed, eats softer foods, no c/o nausea, states no pain, neutropenic  Encouraged patient not to hug people, stay away from anyone who has the sniffles, cold, fever, stay [employer],, mild fatigue 11:48 AM

## 2013-09-23 ENCOUNTER — Ambulatory Visit: Payer: Medicare Other | Admitting: Cardiology

## 2013-09-23 ENCOUNTER — Ambulatory Visit (HOSPITAL_BASED_OUTPATIENT_CLINIC_OR_DEPARTMENT_OTHER): Payer: Medicare Other | Admitting: Nurse Practitioner

## 2013-09-23 ENCOUNTER — Telehealth: Payer: Self-pay | Admitting: Oncology

## 2013-09-23 ENCOUNTER — Other Ambulatory Visit (HOSPITAL_BASED_OUTPATIENT_CLINIC_OR_DEPARTMENT_OTHER): Payer: Medicare Other | Admitting: Lab

## 2013-09-23 VITALS — BP 99/60 | HR 62 | Temp 96.8°F | Resp 19 | Ht 68.0 in | Wt 158.5 lb

## 2013-09-23 DIAGNOSIS — C7A1 Malignant poorly differentiated neuroendocrine tumors: Secondary | ICD-10-CM

## 2013-09-23 DIAGNOSIS — R1319 Other dysphagia: Secondary | ICD-10-CM

## 2013-09-23 DIAGNOSIS — C155 Malignant neoplasm of lower third of esophagus: Secondary | ICD-10-CM

## 2013-09-23 DIAGNOSIS — I509 Heart failure, unspecified: Secondary | ICD-10-CM

## 2013-09-23 DIAGNOSIS — C159 Malignant neoplasm of esophagus, unspecified: Secondary | ICD-10-CM

## 2013-09-23 DIAGNOSIS — C7B8 Other secondary neuroendocrine tumors: Secondary | ICD-10-CM

## 2013-09-23 DIAGNOSIS — E119 Type 2 diabetes mellitus without complications: Secondary | ICD-10-CM

## 2013-09-23 LAB — CBC WITH DIFFERENTIAL/PLATELET
Basophils Absolute: 0 10*3/uL (ref 0.0–0.1)
Eosinophils Absolute: 0 10*3/uL (ref 0.0–0.5)
HCT: 25.5 % — ABNORMAL LOW (ref 38.4–49.9)
LYMPH%: 15 % (ref 14.0–49.0)
MCV: 94.1 fL (ref 79.3–98.0)
MONO#: 0.5 10*3/uL (ref 0.1–0.9)
MONO%: 13.2 % (ref 0.0–14.0)
NEUT#: 2.9 10*3/uL (ref 1.5–6.5)
NEUT%: 70.8 % (ref 39.0–75.0)
Platelets: 173 10*3/uL (ref 140–400)
RBC: 2.71 10*6/uL — ABNORMAL LOW (ref 4.20–5.82)
WBC: 4.1 10*3/uL (ref 4.0–10.3)
lymph#: 0.6 10*3/uL — ABNORMAL LOW (ref 0.9–3.3)

## 2013-09-23 NOTE — Telephone Encounter (Signed)
Gave pt appt for lab,md and chemo on November 2014

## 2013-09-23 NOTE — Progress Notes (Addendum)
OFFICE PROGRESS NOTE  Interval history:  Billy Bush returns as scheduled. He completed the course of radiation on 09/12/2013. The weekly carboplatin was held on 09/11/2013 due to a neutrophil count of 0.9.  He feels well. He denies any difficulty swallowing. No odynophagia. He has a good appetite. No nausea or vomiting. Bowels moving regularly. No bleeding. No skin rash. He denies shortness of breath, cough and fever.   Objective: Blood pressure 99/60, pulse 62, temperature 96.8 F (36 C), temperature source Oral, resp. rate 19, height 5\' 8"  (1.727 m), weight 158 lb 8 oz (71.895 kg).  Oropharynx is without thrush or ulceration. Lungs are clear bilaterally. Regular cardiac rhythm. Abdomen is soft and nontender. No hepatomegaly. Trace lower leg and ankle edema bilaterally.  Lab Results: Lab Results  Component Value Date   WBC 4.1 09/23/2013   HGB 8.2* 09/23/2013   HCT 25.5* 09/23/2013   MCV 94.1 09/23/2013   PLT 173 09/23/2013    Chemistry:    Chemistry      Component Value Date/Time   NA 134* 08/21/2013 1152   NA 135 07/04/2013 0932   K 3.7 08/21/2013 1152   K 4.7 07/04/2013 0932   CL 104 07/04/2013 0932   CO2 24 08/21/2013 1152   CO2 25 07/04/2013 0932   BUN 9.1 08/21/2013 1152   BUN 14 07/04/2013 0932   CREATININE 0.9 08/21/2013 1152   CREATININE 1.0 07/04/2013 0932      Component Value Date/Time   CALCIUM 8.8 08/21/2013 1152   CALCIUM 8.9 07/04/2013 0932   ALKPHOS 65 08/21/2013 1152   ALKPHOS 61 01/03/2013 0859   AST 12 08/21/2013 1152   AST 19 01/03/2013 0859   ALT 6 08/21/2013 1152   ALT 14 01/03/2013 0859   BILITOT 0.60 08/21/2013 1152   BILITOT 1.0 01/03/2013 0859       Studies/Results: No results found.  Medications: I have reviewed the patient's current medications.  Assessment/Plan:  1. High-grade neuroendocrine carcinoma of the esophagus, clinical stage III. Staging PET scan 07/23/2013 showed a large distal esophageal mass with neoplastic range FDG uptake, metastatic upper  abdominal lymphadenopathy, indeterminate small pulmonary nodules. -initiation of radiation on 08/06/2013. Radiation completed 09/12/2013. -Initiation of weekly carboplatin 08/07/2013. Final weekly carboplatin 08/28/2013. 2. Dysphagia secondary to #1. Improved. 3. History of coronary artery disease. 4. Congestive heart failure. 5. Diabetes. 6. Weight loss. Weight is stable. 7. Mild hypotension.  8. 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 9. Thrombocytopenia secondary to chemotherapy. The platelet count is normal today.  Disposition-Billy Bush appears stable. He has completed the course of radiation and weekly carboplatin. Dr. Truett Perna recommends carboplatin/etoposide on a 4 week schedule with Neulasta support. We reviewed potential toxicities associated with the chemotherapy including myelosuppression, nausea, hair loss, rash, allergic reaction. We reviewed potential toxicities associated with Neulasta including pain, skin rash, splenic rupture. He is agreeable to proceed. He will return to begin the first cycle on 10/06/2013. We will obtain restaging CT scans prior to cycle #1.  We will see him in followup on 10/20/2013. He will contact the office in the interim with any problems.  Approximately 25 minutes were spent face-to-face at today's visit with the majority of that time involved in counseling and coordination of care.  Patient seen with Dr. Truett Perna.  Lonna Cobb ANP/GNP-BC   This was a shared visit with Lonna Cobb. Billy Bush completed radiation and weekly carboplatin. There has been significant improvement in the dysphagia. I discussed treatment options with Billy Bush. He  was diagnosed with a high-grade neuroendocrine carcinoma of the esophagus. There is a high chance he will develop systemic progression in the future. I recommend etoposide/carboplatin chemotherapy. We reviewed the potential toxicities associated with this regimen. He agrees to proceed. He will  undergo a restaging CT evaluation prior to a first cycle of etoposide/carboplatin scheduled for 10/06/2013.  Mancel Bale, M.D.

## 2013-09-24 ENCOUNTER — Ambulatory Visit: Payer: Medicare Other | Admitting: Nurse Practitioner

## 2013-09-24 ENCOUNTER — Other Ambulatory Visit: Payer: Medicare Other | Admitting: Lab

## 2013-09-25 ENCOUNTER — Ambulatory Visit (HOSPITAL_COMMUNITY)
Admission: RE | Admit: 2013-09-25 | Discharge: 2013-09-25 | Disposition: A | Discharge status: Home / Self Care | Payer: Medicare Other | Source: Ambulatory Visit | Attending: Nurse Practitioner | Admitting: Nurse Practitioner

## 2013-09-25 DIAGNOSIS — I700 Atherosclerosis of aorta: Secondary | ICD-10-CM | POA: Insufficient documentation

## 2013-09-25 DIAGNOSIS — N200 Calculus of kidney: Secondary | ICD-10-CM | POA: Insufficient documentation

## 2013-09-25 DIAGNOSIS — N400 Enlarged prostate without lower urinary tract symptoms: Secondary | ICD-10-CM | POA: Insufficient documentation

## 2013-09-25 DIAGNOSIS — C7A1 Malignant poorly differentiated neuroendocrine tumors: Secondary | ICD-10-CM | POA: Insufficient documentation

## 2013-09-25 DIAGNOSIS — C155 Malignant neoplasm of lower third of esophagus: Secondary | ICD-10-CM

## 2013-09-25 DIAGNOSIS — M51379 Other intervertebral disc degeneration, lumbosacral region without mention of lumbar back pain or lower extremity pain: Secondary | ICD-10-CM | POA: Insufficient documentation

## 2013-09-25 DIAGNOSIS — Z951 Presence of aortocoronary bypass graft: Secondary | ICD-10-CM | POA: Insufficient documentation

## 2013-09-25 DIAGNOSIS — C7B8 Other secondary neuroendocrine tumors: Secondary | ICD-10-CM | POA: Insufficient documentation

## 2013-09-25 DIAGNOSIS — R918 Other nonspecific abnormal finding of lung field: Secondary | ICD-10-CM | POA: Insufficient documentation

## 2013-09-25 DIAGNOSIS — K7689 Other specified diseases of liver: Secondary | ICD-10-CM | POA: Insufficient documentation

## 2013-09-25 DIAGNOSIS — Z95810 Presence of automatic (implantable) cardiac defibrillator: Secondary | ICD-10-CM | POA: Insufficient documentation

## 2013-09-25 DIAGNOSIS — M5137 Other intervertebral disc degeneration, lumbosacral region: Secondary | ICD-10-CM | POA: Insufficient documentation

## 2013-09-25 DIAGNOSIS — K8020 Calculus of gallbladder without cholecystitis without obstruction: Secondary | ICD-10-CM | POA: Insufficient documentation

## 2013-09-25 MED ORDER — IOHEXOL 300 MG/ML  SOLN
50.0000 mL | Freq: Once | INTRAMUSCULAR | Status: AC | PRN
  Administered 2013-09-25: 50 mL via ORAL

## 2013-09-28 NOTE — Progress Notes (Signed)
  Radiation Oncology         [phone] ________________________________  Name: Billy Bush MRN: [id number]  Date: 09/12/2013  DOB: [birth year]  End of Treatment Note  Diagnosis:   Esophageal cancer     Indication for treatment:  Curative       Radiation treatment dates:   08/06/2013 through 09/12/2013  Site/dose:   The patient was treated using a IMRT technique with daily image guidance on our tomotherapy unit. This treatment targeted the gross disease within the esophagus and high-risk nodal regions.  Narrative: The patient tolerated radiation treatment relatively well.   He was swallowing a variety of foods at the end of treatment without difficulty.  Plan: The patient has completed radiation treatment. The patient will return to radiation oncology clinic for routine followup in one month. I advised the patient to call or return sooner if they have any questions or concerns related to their recovery or treatment. ________________________________  Radene Gunning, M.D., Ph.D.    Mervyn Skeeters

## 2013-09-29 ENCOUNTER — Ambulatory Visit (INDEPENDENT_AMBULATORY_CARE_PROVIDER_SITE_OTHER): Payer: Medicare Other | Admitting: Cardiology

## 2013-09-29 ENCOUNTER — Encounter: Payer: Self-pay | Admitting: Cardiology

## 2013-09-29 VITALS — BP 106/54 | HR 66 | Ht 67.0 in | Wt 159.8 lb

## 2013-09-29 DIAGNOSIS — I2589 Other forms of chronic ischemic heart disease: Secondary | ICD-10-CM

## 2013-09-29 DIAGNOSIS — I2510 Atherosclerotic heart disease of native coronary artery without angina pectoris: Secondary | ICD-10-CM

## 2013-09-29 DIAGNOSIS — I25810 Atherosclerosis of coronary artery bypass graft(s) without angina pectoris: Secondary | ICD-10-CM

## 2013-09-29 DIAGNOSIS — E785 Hyperlipidemia, unspecified: Secondary | ICD-10-CM | POA: Insufficient documentation

## 2013-09-29 DIAGNOSIS — I5022 Chronic systolic (congestive) heart failure: Secondary | ICD-10-CM

## 2013-09-29 MED ORDER — SPIRONOLACTONE 25 MG PO TABS: ORAL_TABLET | ORAL | Status: DC

## 2013-09-29 MED ORDER — RAMIPRIL 5 MG PO CAPS: ORAL_CAPSULE | ORAL | Status: DC

## 2013-09-29 NOTE — Patient Instructions (Addendum)
Your physician wants you to follow-up in: 3 months with Dr Shirlee Latch. (February 2015).  You will receive a reminder letter in the mail two months in advance. If you don't receive a letter, please call our office to schedule the follow-up appointment.   Your physician recommends that you return for a FASTING lipid profile /BMET in February 2015.  Decrease aspirin to 81mg  daily.

## 2013-09-29 NOTE — Progress Notes (Signed)
Patient ID: Billy [facility], male   DOB: Sep 29, 1941, 72 y.o.   MRN: [id number] PCP: Dr. Everardo All  72 yo with history of CAD s/p CABG, ischemic cardiomyopathy, and esophageal cancer presents for cardiology followup.  He has been seen by Dr. Daleen Squibb in the past and is seen by me for the first time today.  Since last visit here, he was diagnosed with a high grade neuroendocrine esophageal cancer (had dysphagia) and has undergone chemotherapy and radiation.  He has completed radiation, now getting monthly carboplatin and etoposide.  Dysphagia has resolved.    Last echo was in 11/12 and showed EF 30-35%.  His last cardiac cath was in 2008.  At that time, his bypass grafts were patent but he had Cypher DES to left main.  Lately, he has been doing well. No chest pain.  No exertional dyspnea.  He can climb a flight of steps with no problems.  No orthopnea or PND.  No syncope or lightheadedness.  His weight is down considerably since last visit, coinciding with cancer treatment.   ECG; NSR, inferolateral T wave inversions, old ASMI, QRS 116 msec  Labs (2/14): LDL 81, HDL 43 Labs (9/14): K 3.7, creatinine 0.9 Labs (10/14): hemoglobin 8.3, plts 173  PMH: 1. Type II diabetes 2. CAD: s/p CABG 1997.  Last LHC in 2008 showed 80% distal LM, patent LIMA-LAD, patent SVG-OM, patent SVG-PDA.  Patient had Cypher DES to left main.  3. Ischemic cardiomyopathy: Patient has a [employer] ICD.  Last echo in 11/12 showed EF 30-35% with severe LV dilation, EF 30-35%, apical and basal inferior akinesis, moderate MR, mild AI.  4. 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 Now getting monthly carboplatin and etoposide.  5. Hyperlipidemia  SH: Married, retired [employer], prior smoker, lives in [place].  FH: No premature CAD.   ROS: All systems reviewed and negative except as per HPI.   Current Outpatient Prescriptions  Medication Sig Dispense  Refill  . atorvastatin (LIPITOR) 80 MG tablet TAKE 1 TABLET AT BEDTIME  90 tablet  0  . carvedilol (COREG) 12.5 MG tablet 2 (two) times daily with a meal.       . citalopram (CELEXA) 10 MG tablet Take 1 tablet (10 mg total) by mouth daily.  90 tablet  0  . clopidogrel (PLAVIX) 75 MG tablet TAKE 1 TABLET BY MOUTH ONCE DAILY  90 tablet  2  . co-enzyme Q-10 30 MG capsule Take 30 mg by mouth daily.        Marland Kitchen emollient (BIAFINE) cream Apply 1 application topically daily. Apply  To affected skin areaafter rad txs daily and prn, just not 4 hours prior to rad txs      . furosemide (LASIX) 80 MG tablet Take 80 mg by mouth daily.       Marland Kitchen ibuprofen (ADVIL,MOTRIN) 200 MG tablet Take 200 mg by mouth every 8 (eight) hours as needed for pain.      Marland Kitchen insulin lispro (HUMALOG) 100 UNIT/ML injection Inject into the skin 3x a day (just before each meal) 11-04-27 units  15 mL  2  . Insulin Pen Needle (BD PEN NEEDLE NANO U/F) 32G X 4 MM MISC Use as directed four times daily  400 each  3  . LORazepam (ATIVAN) 1 MG tablet Take 0.5-1 mg by mouth every 6 (six) hours as needed (nausea).      . Multiple Vitamin (MULTIVITAMIN) tablet Take 1 tablet  by mouth daily.        . nitroGLYCERIN (NITROSTAT) 0.4 MG SL tablet Place 1 tablet (0.4 mg total) under the tongue every 5 (five) minutes as needed for chest pain.  25 tablet  3  . pantoprazole (PROTONIX) 40 MG tablet Take 1 tablet (40 mg total) by mouth daily.  90 tablet  3  . prochlorperazine (COMPAZINE) 10 MG tablet Take 1 tablet (10 mg total) by mouth every 6 (six) hours as needed.  60 tablet  2  . ramipril (ALTACE) 5 MG capsule TAKE 1 CAPSULE BY MOUTH DAILY  90 capsule  1  . spironolactone (ALDACTONE) 25 MG tablet 1 tablet daily  90 tablet  1  . sucralfate (CARAFATE) 1 G tablet Take 1 tablet (1 g total) by mouth 4 (four) times daily.  120 tablet  2  . aspirin EC 81 MG tablet Take 1 tablet (81 mg total) by mouth daily.       No current facility-administered medications for this  visit.    BP 106/54  Pulse 66  Ht 5\' 7"  (1.702 m)  Wt 72.485 kg (159 lb 12.8 oz)  BMI 25.02 kg/m2  SpO2 97% General: NAD Neck: No JVD, no thyromegaly or thyroid nodule.  Lungs: Clear to auscultation bilaterally with normal respiratory effort. CV: Nondisplaced PMI.  Heart regular S1/S2, no S3/S4, 1/6 HSM at apex.  1+ edema 1/3 up bilaterally.  No carotid bruit.  Normal pedal pulses.  Abdomen: Soft, nontender, no hepatosplenomegaly, no distention.  Neurologic: Alert and oriented x 3.  Psych: Normal affect. Extremities: No clubbing or cyanosis.   Assessment/Plan: 1. CAD: s/p CABG and later Cypher DES to left main.  Would continue ASA but can decrease to 81 mg daily.  Continue Plavix given 1st generation DES and no bleeding problems.  May need to reassess this if he becomes significantly thrombocytopenic with chemotherapy, but platelets are normal currently. Continue statin, Coreg, ramipril. 2. Chronic systolic CHF: Patient looks near-euvolemic on current Lasix dose with NYHA class II symptoms.  EF 30-35% on last echo. QRS is not significantly prolonged.  He will continue current doses of Coreg, ramipril, and spironolactone.  I will not titrate up meds today given soft blood pressure. Recent K and creatinine were normal.   3. Hyperlipidemia: Will check lipids at followup appointment, goal LDL< 70.   Marca Ancona 09/29/2013 2:27 PM

## 2013-09-30 ENCOUNTER — Telehealth: Payer: Self-pay | Admitting: *Deleted

## 2013-09-30 ENCOUNTER — Other Ambulatory Visit: Payer: Self-pay

## 2013-09-30 MED ORDER — CITALOPRAM HYDROBROMIDE 10 MG PO TABS: 10.0000 mg | ORAL_TABLET | Freq: Every day | ORAL | Status: DC

## 2013-09-30 NOTE — Telephone Encounter (Signed)
Message from pt's wife requesting to be called with CT results. Per Dr. Truett Perna: Everything looks better, push ahead with treatment. Returned call to pt, results given. Wife asks if OK for pt to 1.go to routine dental cleaning appt? YES. 2. have flu shot?

## 2013-10-01 NOTE — Telephone Encounter (Signed)
Returned call to pt's wife: OK to have flu vaccine. OK to have routine cleanings. She voiced understanding.

## 2013-10-02 ENCOUNTER — Other Ambulatory Visit: Payer: Self-pay

## 2013-10-05 ENCOUNTER — Other Ambulatory Visit: Payer: Self-pay | Admitting: Oncology

## 2013-10-06 ENCOUNTER — Other Ambulatory Visit: Payer: Self-pay | Admitting: Oncology

## 2013-10-06 ENCOUNTER — Ambulatory Visit (HOSPITAL_BASED_OUTPATIENT_CLINIC_OR_DEPARTMENT_OTHER): Payer: Medicare Other

## 2013-10-06 ENCOUNTER — Other Ambulatory Visit (HOSPITAL_BASED_OUTPATIENT_CLINIC_OR_DEPARTMENT_OTHER): Payer: Medicare Other | Admitting: Lab

## 2013-10-06 ENCOUNTER — Encounter: Payer: Self-pay | Admitting: Endocrinology

## 2013-10-06 VITALS — BP 102/63 | HR 65 | Temp 98.9°F | Resp 18

## 2013-10-06 DIAGNOSIS — C159 Malignant neoplasm of esophagus, unspecified: Secondary | ICD-10-CM

## 2013-10-06 DIAGNOSIS — C7A1 Malignant poorly differentiated neuroendocrine tumors: Secondary | ICD-10-CM

## 2013-10-06 DIAGNOSIS — Z5111 Encounter for antineoplastic chemotherapy: Secondary | ICD-10-CM

## 2013-10-06 DIAGNOSIS — C7B8 Other secondary neuroendocrine tumors: Secondary | ICD-10-CM

## 2013-10-06 DIAGNOSIS — C155 Malignant neoplasm of lower third of esophagus: Secondary | ICD-10-CM

## 2013-10-06 LAB — COMPREHENSIVE METABOLIC PANEL (CC13)
Albumin: 3.3 g/dL — ABNORMAL LOW (ref 3.5–5.0)
Alkaline Phosphatase: 81 U/L (ref 40–150)
Anion Gap: 8 mEq/L (ref 3–11)
BUN: 17.7 mg/dL (ref 7.0–26.0)
CO2: 23 mEq/L (ref 22–29)
Glucose: 284 mg/dl — ABNORMAL HIGH (ref 70–140)
Potassium: 4.3 mEq/L (ref 3.5–5.1)
Sodium: 133 mEq/L — ABNORMAL LOW (ref 136–145)
Total Bilirubin: 0.7 mg/dL (ref 0.20–1.20)
Total Protein: 6 g/dL — ABNORMAL LOW (ref 6.4–8.3)

## 2013-10-06 LAB — CBC WITH DIFFERENTIAL/PLATELET
Basophils Absolute: 0 10*3/uL (ref 0.0–0.1)
EOS%: 7.6 % — ABNORMAL HIGH (ref 0.0–7.0)
Eosinophils Absolute: 0.4 10*3/uL (ref 0.0–0.5)
HCT: 25.3 % — ABNORMAL LOW (ref 38.4–49.9)
HGB: 8.3 g/dL — ABNORMAL LOW (ref 13.0–17.1)
MCH: 31.1 pg (ref 27.2–33.4)
MCV: 94.8 fL (ref 79.3–98.0)
MONO%: 8.9 % (ref 0.0–14.0)
NEUT#: 4 10*3/uL (ref 1.5–6.5)
NEUT%: 70.9 % (ref 39.0–75.0)
Platelets: 135 10*3/uL — ABNORMAL LOW (ref 140–400)
RBC: 2.67 10*6/uL — ABNORMAL LOW (ref 4.20–5.82)
RDW: 20.8 % — ABNORMAL HIGH (ref 11.0–14.6)
nRBC: 0 % (ref 0–0)

## 2013-10-06 MED ORDER — SODIUM CHLORIDE 0.9 % IV SOLN
Freq: Once | INTRAVENOUS | Status: AC
  Administered 2013-10-06: 13:00:00 via INTRAVENOUS

## 2013-10-06 MED ORDER — SODIUM CHLORIDE 0.9 % IV SOLN
80.0000 mg/m2 | Freq: Once | INTRAVENOUS | Status: AC
  Administered 2013-10-06: 150 mg via INTRAVENOUS
  Filled 2013-10-06: qty 7.5

## 2013-10-06 MED ORDER — DEXAMETHASONE SODIUM PHOSPHATE 20 MG/5ML IJ SOLN
20.0000 mg | Freq: Once | INTRAMUSCULAR | Status: AC
  Administered 2013-10-06: 20 mg via INTRAVENOUS

## 2013-10-06 MED ORDER — ONDANSETRON 16 MG/50ML IVPB (CHCC)
16.0000 mg | Freq: Once | INTRAVENOUS | Status: AC
  Administered 2013-10-06: 16 mg via INTRAVENOUS

## 2013-10-06 MED ORDER — DEXAMETHASONE SODIUM PHOSPHATE 20 MG/5ML IJ SOLN
INTRAMUSCULAR | Status: AC
  Filled 2013-10-06: qty 5

## 2013-10-06 MED ORDER — SODIUM CHLORIDE 0.9 % IV SOLN
380.0000 mg | Freq: Once | INTRAVENOUS | Status: AC
  Administered 2013-10-06: 380 mg via INTRAVENOUS
  Filled 2013-10-06: qty 38

## 2013-10-06 MED ORDER — ONDANSETRON 16 MG/50ML IVPB (CHCC)
INTRAVENOUS | Status: AC
  Filled 2013-10-06: qty 16

## 2013-10-06 NOTE — Patient Instructions (Signed)
[facility] Discharge Instructions for Patients Receiving Chemotherapy  Today you received the following chemotherapy agents: Carboplatin, Etoposide  To help prevent nausea and vomiting after your treatment, we encourage you to take your nausea medication as prescribed.   If you develop nausea and vomiting that is not controlled by your nausea medication, call the clinic.   BELOW ARE SYMPTOMS THAT SHOULD BE REPORTED IMMEDIATELY:  *FEVER GREATER THAN 100.5 F  *CHILLS WITH OR WITHOUT FEVER  NAUSEA AND VOMITING THAT IS NOT CONTROLLED WITH YOUR NAUSEA MEDICATION  *UNUSUAL SHORTNESS OF BREATH  *UNUSUAL BRUISING OR BLEEDING  TENDERNESS IN MOUTH AND THROAT WITH OR WITHOUT PRESENCE OF ULCERS  *URINARY PROBLEMS  *BOWEL PROBLEMS  UNUSUAL RASH Items with * indicate a potential emergency and should be followed up as soon as possible.  Feel free to call the clinic you have any questions or concerns. The clinic phone number is [phone].   Etoposide, VP-16 injection What is this medicine? ETOPOSIDE, VP-16 (e toe POE side) is a chemotherapy drug. It is used to treat testicular cancer, lung cancer, and other cancers. This medicine may be used for other purposes; ask your health care provider or pharmacist if you have questions. COMMON BRAND NAME(S): Etopophos, Toposar, VePesid What should I tell my health care provider before I take this medicine? They need to know if you have any of these conditions: -infection -kidney disease -low blood counts, like low white cell, platelet, or red cell counts -an unusual or allergic reaction to etoposide, other chemotherapeutic agents, other medicines, foods, dyes, or preservatives -pregnant or trying to get pregnant -breast-feeding How should I use this medicine? This medicine is for infusion into a vein. It is administered in a hospital or clinic by a specially trained health care professional. Talk to your pediatrician  regarding the use of this medicine in children. Special care may be needed. Overdosage: If you think you have taken too much of this medicine contact a poison control [facility] or emergency room at once. NOTE: This medicine is only for you. Do not share this medicine with others. What if I miss a dose? It is important not to miss your dose. Call your doctor or health care professional if you are unable to keep an appointment. What may interact with this medicine? -cyclosporine -medicines to increase blood counts like filgrastim, pegfilgrastim, sargramostim -vaccines This list may not describe all possible interactions. Give your health care provider a list of all the medicines, herbs, non-prescription drugs, or dietary supplements you use. Also tell them if you smoke, drink alcohol, or use illegal drugs. Some items may interact with your medicine. What should I watch for while using this medicine? Visit your doctor for checks on your progress. This drug may make you feel generally unwell. This is not uncommon, as chemotherapy can affect healthy cells as well as cancer cells. Report any side effects. Continue your course of treatment even though you feel ill unless your doctor tells you to stop. In some cases, you may be given additional medicines to help with side effects. Follow all directions for their use. Call your doctor or health care professional for advice if you get a fever, chills or sore throat, or other symptoms of a cold or flu. Do not treat yourself. This drug decreases your body's ability to fight infections. Try to avoid being around people who are sick. This medicine may increase your risk to bruise or bleed. Call your doctor or health care professional if  you notice any unusual bleeding. Be careful brushing and flossing your teeth or using a toothpick because you may get an infection or bleed more easily. If you have any dental work done, tell your dentist you are receiving this  medicine. Avoid taking products that contain aspirin, acetaminophen, ibuprofen, naproxen, or ketoprofen unless instructed by your doctor. These medicines may hide a fever. Do not become pregnant while taking this medicine. Women should inform their doctor if they wish to become pregnant or think they might be pregnant. There is a potential for serious side effects to an unborn child. Talk to your health care professional or pharmacist for more information. Do not breast-feed an infant while taking this medicine. What side effects may I notice from receiving this medicine? Side effects that you should report to your doctor or health care professional as soon as possible: -allergic reactions like skin rash, itching or hives, swelling of the face, lips, or tongue -low blood counts - this medicine may decrease the number of white blood cells, red blood cells and platelets. You may be at increased risk for infections and bleeding. -signs of infection - fever or chills, cough, sore throat, pain or difficulty passing urine -signs of decreased platelets or bleeding - bruising, pinpoint red spots on the skin, black, tarry stools, blood in the urine -signs of decreased red blood cells - unusually weak or tired, fainting spells, lightheadedness -breathing problems -changes in vision -mouth or throat sores or ulcers -pain, redness, swelling or irritation at the injection site -pain, tingling, numbness in the hands or feet -redness, blistering, peeling or loosening of the skin, including inside the mouth -seizures -vomiting Side effects that usually do not require medical attention (report to your doctor or health care professional if they continue or are bothersome): -diarrhea -hair loss -loss of appetite -nausea -stomach pain This list may not describe all possible side effects. Call your doctor for medical advice about side effects. You may report side effects to FDA at 1-800-FDA-1088. Where should I  keep my medicine? This drug is given in a hospital or clinic and will not be stored at home. NOTE: This sheet is a summary. It may not cover all possible information. If you have questions about this medicine, talk to your doctor, pharmacist, or health care provider.  2014, Elsevier/Gold Standard. (2008-03-16 17:24:12)

## 2013-10-06 NOTE — Progress Notes (Signed)
Okay to treat today with hemoglobin 8.3 per Dr. Truett Perna. Angelena Form, RN

## 2013-10-07 ENCOUNTER — Other Ambulatory Visit: Payer: Self-pay | Admitting: Endocrinology

## 2013-10-07 ENCOUNTER — Other Ambulatory Visit: Payer: Self-pay | Admitting: *Deleted

## 2013-10-07 ENCOUNTER — Ambulatory Visit (HOSPITAL_BASED_OUTPATIENT_CLINIC_OR_DEPARTMENT_OTHER): Payer: Medicare Other

## 2013-10-07 ENCOUNTER — Telehealth: Payer: Self-pay | Admitting: Cardiology

## 2013-10-07 ENCOUNTER — Telehealth: Payer: Self-pay | Admitting: Endocrinology

## 2013-10-07 ENCOUNTER — Other Ambulatory Visit: Payer: Medicare Other | Admitting: Lab

## 2013-10-07 DIAGNOSIS — C159 Malignant neoplasm of esophagus, unspecified: Secondary | ICD-10-CM

## 2013-10-07 DIAGNOSIS — C7B8 Other secondary neuroendocrine tumors: Secondary | ICD-10-CM

## 2013-10-07 DIAGNOSIS — C7A1 Malignant poorly differentiated neuroendocrine tumors: Secondary | ICD-10-CM

## 2013-10-07 DIAGNOSIS — C155 Malignant neoplasm of lower third of esophagus: Secondary | ICD-10-CM

## 2013-10-07 DIAGNOSIS — Z5111 Encounter for antineoplastic chemotherapy: Secondary | ICD-10-CM

## 2013-10-07 MED ORDER — GLUCOSE BLOOD VI STRP: ORAL_STRIP | Status: DC

## 2013-10-07 MED ORDER — SODIUM CHLORIDE 0.9 % IV SOLN
Freq: Once | INTRAVENOUS | Status: AC
  Administered 2013-10-07: 13:00:00 via INTRAVENOUS

## 2013-10-07 MED ORDER — ONDANSETRON 8 MG/50ML IVPB (CHCC)
8.0000 mg | Freq: Once | INTRAVENOUS | Status: AC
  Administered 2013-10-07: 8 mg via INTRAVENOUS

## 2013-10-07 MED ORDER — ONDANSETRON 8 MG/NS 50 ML IVPB
INTRAVENOUS | Status: AC
  Filled 2013-10-07: qty 8

## 2013-10-07 MED ORDER — INSULIN LISPRO 100 UNIT/ML ~~LOC~~ SOLN: SUBCUTANEOUS | Status: DC

## 2013-10-07 MED ORDER — SODIUM CHLORIDE 0.9 % IV SOLN
80.0000 mg/m2 | Freq: Once | INTRAVENOUS | Status: AC
  Administered 2013-10-07: 150 mg via INTRAVENOUS
  Filled 2013-10-07: qty 7.5

## 2013-10-07 NOTE — Telephone Encounter (Signed)
please call patient: Ov is due 

## 2013-10-07 NOTE — Telephone Encounter (Signed)
Left message for Billy [facility], 09-29-13 script was sent to mail order for 25 mg one tablet daily, quantity of 90. She will call back with questions.

## 2013-10-07 NOTE — Telephone Encounter (Signed)
New problem,    Pt's wife Rivka Barbara called about the pt's med's [phone]  STIRONOLACTONE.  25 mg 1daily  Not .5 (1/2).  Pt only has half the amount they need

## 2013-10-07 NOTE — Patient Instructions (Signed)
Etoposide, VP-16 injection  What is this medicine?  ETOPOSIDE, VP-16 (e toe POE side) is a chemotherapy drug. It is used to treat testicular cancer, lung cancer, and other cancers.  This medicine may be used for other purposes; ask your health care provider or pharmacist if you have questions.  COMMON BRAND NAME(S): Etopophos, Toposar, VePesid  What should I tell my health care provider before I take this medicine?  They need to know if you have any of these conditions:  -infection  -kidney disease  -low blood counts, like low white cell, platelet, or red cell counts  -an unusual or allergic reaction to etoposide, other chemotherapeutic agents, other medicines, foods, dyes, or preservatives  -pregnant or trying to get pregnant  -breast-feeding  How should I use this medicine?  This medicine is for infusion into a vein. It is administered in a hospital or clinic by a specially trained health care professional.  Talk to your pediatrician regarding the use of this medicine in children. Special care may be needed.  Overdosage: If you think you have taken too much of this medicine contact a poison control center or emergency room at once.  NOTE: This medicine is only for you. Do not share this medicine with others.  What if I miss a dose?  It is important not to miss your dose. Call your doctor or health care professional if you are unable to keep an appointment.  What may interact with this medicine?  -cyclosporine  -medicines to increase blood counts like filgrastim, pegfilgrastim, sargramostim  -vaccines  This list may not describe all possible interactions. Give your health care provider a list of all the medicines, herbs, non-prescription drugs, or dietary supplements you use. Also tell them if you smoke, drink alcohol, or use illegal drugs. Some items may interact with your medicine.  What should I watch for while using this medicine?  Visit your doctor for checks on your progress. This drug may make you feel  generally unwell. This is not uncommon, as chemotherapy can affect healthy cells as well as cancer cells. Report any side effects. Continue your course of treatment even though you feel ill unless your doctor tells you to stop.  In some cases, you may be given additional medicines to help with side effects. Follow all directions for their use.  Call your doctor or health care professional for advice if you get a fever, chills or sore throat, or other symptoms of a cold or flu. Do not treat yourself. This drug decreases your body's ability to fight infections. Try to avoid being around people who are sick.  This medicine may increase your risk to bruise or bleed. Call your doctor or health care professional if you notice any unusual bleeding.  Be careful brushing and flossing your teeth or using a toothpick because you may get an infection or bleed more easily. If you have any dental work done, tell your dentist you are receiving this medicine.  Avoid taking products that contain aspirin, acetaminophen, ibuprofen, naproxen, or ketoprofen unless instructed by your doctor. These medicines may hide a fever.  Do not become pregnant while taking this medicine. Women should inform their doctor if they wish to become pregnant or think they might be pregnant. There is a potential for serious side effects to an unborn child. Talk to your health care professional or pharmacist for more information. Do not breast-feed an infant while taking this medicine.  What side effects may I notice from receiving this   medicine?  Side effects that you should report to your doctor or health care professional as soon as possible:  -allergic reactions like skin rash, itching or hives, swelling of the face, lips, or tongue  -low blood counts - this medicine may decrease the number of white blood cells, red blood cells and platelets. You may be at increased risk for infections and bleeding.  -signs of infection - fever or chills, cough, sore  throat, pain or difficulty passing urine  -signs of decreased platelets or bleeding - bruising, pinpoint red spots on the skin, black, tarry stools, blood in the urine  -signs of decreased red blood cells - unusually weak or tired, fainting spells, lightheadedness  -breathing problems  -changes in vision  -mouth or throat sores or ulcers  -pain, redness, swelling or irritation at the injection site  -pain, tingling, numbness in the hands or feet  -redness, blistering, peeling or loosening of the skin, including inside the mouth  -seizures  -vomiting  Side effects that usually do not require medical attention (report to your doctor or health care professional if they continue or are bothersome):  -diarrhea  -hair loss  -loss of appetite  -nausea  -stomach pain  This list may not describe all possible side effects. Call your doctor for medical advice about side effects. You may report side effects to FDA at 1-800-FDA-1088.  Where should I keep my medicine?  This drug is given in a hospital or clinic and will not be stored at home.  NOTE: This sheet is a summary. It may not cover all possible information. If you have questions about this medicine, talk to your doctor, pharmacist, or health care provider.   2014, [employer]vier/[employer] Standard. (2008-03-16 17:24:12)

## 2013-10-08 ENCOUNTER — Ambulatory Visit (HOSPITAL_BASED_OUTPATIENT_CLINIC_OR_DEPARTMENT_OTHER): Payer: Medicare Other

## 2013-10-08 VITALS — BP 98/49 | HR 61 | Temp 98.1°F | Resp 16

## 2013-10-08 DIAGNOSIS — C7A1 Malignant poorly differentiated neuroendocrine tumors: Secondary | ICD-10-CM

## 2013-10-08 DIAGNOSIS — C155 Malignant neoplasm of lower third of esophagus: Secondary | ICD-10-CM

## 2013-10-08 DIAGNOSIS — C159 Malignant neoplasm of esophagus, unspecified: Secondary | ICD-10-CM

## 2013-10-08 DIAGNOSIS — C7B8 Other secondary neuroendocrine tumors: Secondary | ICD-10-CM

## 2013-10-08 MED ORDER — SODIUM CHLORIDE 0.9 % IV SOLN
Freq: Once | INTRAVENOUS | Status: AC
  Administered 2013-10-08: 12:00:00 via INTRAVENOUS

## 2013-10-08 MED ORDER — SODIUM CHLORIDE 0.9 % IV SOLN
80.0000 mg/m2 | Freq: Once | INTRAVENOUS | Status: AC
  Administered 2013-10-08: 150 mg via INTRAVENOUS
  Filled 2013-10-08: qty 7.5

## 2013-10-08 MED ORDER — ONDANSETRON 8 MG/50ML IVPB (CHCC)
8.0000 mg | Freq: Once | INTRAVENOUS | Status: AC
  Administered 2013-10-08: 8 mg via INTRAVENOUS

## 2013-10-08 MED ORDER — ONDANSETRON 8 MG/NS 50 ML IVPB
INTRAVENOUS | Status: AC
  Filled 2013-10-08: qty 8

## 2013-10-08 NOTE — Patient Instructions (Addendum)
[facility] Discharge Instructions for Patients Receiving Chemotherapy  Today you received the following chemotherapy agents Etoposide.   To help prevent nausea and vomiting after your treatment, we encourage you to take your nausea medication as directed.    If you develop nausea and vomiting that is not controlled by your nausea medication, call the clinic.   BELOW ARE SYMPTOMS THAT SHOULD BE REPORTED IMMEDIATELY:  *FEVER GREATER THAN 100.5 F  *CHILLS WITH OR WITHOUT FEVER  NAUSEA AND VOMITING THAT IS NOT CONTROLLED WITH YOUR NAUSEA MEDICATION  *UNUSUAL SHORTNESS OF BREATH  *UNUSUAL BRUISING OR BLEEDING  TENDERNESS IN MOUTH AND THROAT WITH OR WITHOUT PRESENCE OF ULCERS  *URINARY PROBLEMS  *BOWEL PROBLEMS  UNUSUAL RASH Items with * indicate a potential emergency and should be followed up as soon as possible.  Feel free to call the clinic you have any questions or concerns. The clinic phone number is [phone].    

## 2013-10-09 ENCOUNTER — Ambulatory Visit (HOSPITAL_BASED_OUTPATIENT_CLINIC_OR_DEPARTMENT_OTHER): Payer: Medicare Other

## 2013-10-09 VITALS — BP 101/49 | HR 67 | Temp 97.4°F

## 2013-10-09 DIAGNOSIS — Z5189 Encounter for other specified aftercare: Secondary | ICD-10-CM

## 2013-10-09 DIAGNOSIS — C155 Malignant neoplasm of lower third of esophagus: Secondary | ICD-10-CM

## 2013-10-09 DIAGNOSIS — C7B8 Other secondary neuroendocrine tumors: Secondary | ICD-10-CM

## 2013-10-09 DIAGNOSIS — C7A1 Malignant poorly differentiated neuroendocrine tumors: Secondary | ICD-10-CM

## 2013-10-09 DIAGNOSIS — C159 Malignant neoplasm of esophagus, unspecified: Secondary | ICD-10-CM

## 2013-10-09 MED ORDER — PEGFILGRASTIM INJECTION 6 MG/0.6ML
6.0000 mg | Freq: Once | SUBCUTANEOUS | Status: AC
  Administered 2013-10-09: 6 mg via SUBCUTANEOUS
  Filled 2013-10-09: qty 0.6

## 2013-10-09 NOTE — Patient Instructions (Signed)

## 2013-10-10 NOTE — Telephone Encounter (Signed)
Please call and schedule OV with Dr Everardo All. Thank you.

## 2013-10-14 ENCOUNTER — Telehealth: Payer: Self-pay | Admitting: *Deleted

## 2013-10-14 NOTE — Telephone Encounter (Signed)
Call from pt's wife requesting copy of path report for his cancer policy. Has appt 11/20, would like to pick up then. Instructed her to ask for medical records dept when they check in for appt. They will be able to release records to pt. She voiced understanding.

## 2013-10-15 ENCOUNTER — Encounter: Payer: Self-pay | Admitting: Radiation Oncology

## 2013-10-16 ENCOUNTER — Other Ambulatory Visit: Payer: Self-pay | Admitting: *Deleted

## 2013-10-16 ENCOUNTER — Ambulatory Visit
Admission: RE | Admit: 2013-10-16 | Discharge: 2013-10-16 | Disposition: A | Discharge status: Home / Self Care | Payer: Medicare Other | Source: Ambulatory Visit | Attending: Radiation Oncology | Admitting: Radiation Oncology

## 2013-10-16 ENCOUNTER — Encounter: Payer: Self-pay | Admitting: Radiation Oncology

## 2013-10-16 VITALS — BP 96/45 | HR 77 | Temp 97.8°F | Resp 20 | Wt 156.8 lb

## 2013-10-16 DIAGNOSIS — C155 Malignant neoplasm of lower third of esophagus: Secondary | ICD-10-CM

## 2013-10-16 HISTORY — DX: Personal history of irradiation: Z92.3

## 2013-10-16 MED ORDER — INSULIN LISPRO 100 UNIT/ML ~~LOC~~ SOLN: SUBCUTANEOUS | Status: DC

## 2013-10-16 NOTE — Progress Notes (Signed)
Follow up s/p rad txs esophageal 08/06/13-09/12/13, last chemo 2 weeks ago, low b/p, no pain, for 7 days,  takes claritin daily,eating better, sometimes gets a tickle swallowing, , appt with Dr. Myrle Sheng 10/20/13 , energy level better than it was,  3:07 PM

## 2013-10-16 NOTE — Progress Notes (Signed)
Radiation Oncology         [phone] ________________________________  Name: Billy Bush MRN: [id number]  Date: 10/16/2013  DOB: August 25, 1941  Follow-Up Visit Note  CC: Romero Belling, MD  Ladene Artist, MD  Diagnosis:   Esophageal cancer, neuroendocrine carcinoma  Interval Since Last Radiation:  One month   Narrative:  The patient returns today for routine follow-up.  The patient states that he is doing fairly well today. He has been able to swallow much better. He really is eating a much wider variety of food and he really does not have any significant complaints today in this regard. He states that he is able to drink fluids much better as well. He is continuing with chemotherapy. His energy level is improved as compared to 1 month ago.                              ALLERGIES:  has No Known Allergies.  Meds: Current Outpatient Prescriptions  Medication Sig Dispense Refill  . aspirin EC 81 MG tablet Take 1 tablet (81 mg total) by mouth daily.      Marland Kitchen atorvastatin (LIPITOR) 80 MG tablet Take 1 tablet by mouth at  bedtime  90 tablet  0  . carvedilol (COREG) 12.5 MG tablet 2 (two) times daily with a meal.       . citalopram (CELEXA) 10 MG tablet Take 1 tablet (10 mg total) by mouth daily.  90 tablet  0  . clopidogrel (PLAVIX) 75 MG tablet TAKE 1 TABLET BY MOUTH ONCE DAILY  90 tablet  2  . co-enzyme Q-10 30 MG capsule Take 30 mg by mouth daily.        Marland Kitchen emollient (BIAFINE) cream Apply 1 application topically daily. Apply  To affected skin areaafter rad txs daily and prn, just not 4 hours prior to rad txs      . FLUZONE HIGH-DOSE injection       . furosemide (LASIX) 80 MG tablet Take 80 mg by mouth daily.       Marland Kitchen glucose blood (ONE TOUCH ULTRA TEST) test strip Use as directed twice daily dx 250.01  200 each  3  . ibuprofen (ADVIL,MOTRIN) 200 MG tablet Take 200 mg by mouth every 8 (eight) hours as needed for pain.      Marland Kitchen insulin lispro (HUMALOG) 100 UNIT/ML injection Inject into  the skin 3x a day (just before each meal) 11-04-27 units  15 mL  2  . Insulin Pen Needle (BD PEN NEEDLE NANO U/F) 32G X 4 MM MISC Use as directed four times daily  400 each  3  . LORazepam (ATIVAN) 1 MG tablet Take 0.5-1 mg by mouth every 6 (six) hours as needed (nausea).      . Multiple Vitamin (MULTIVITAMIN) tablet Take 1 tablet by mouth daily.        . nitroGLYCERIN (NITROSTAT) 0.4 MG SL tablet Place 1 tablet (0.4 mg total) under the tongue every 5 (five) minutes as needed for chest pain.  25 tablet  3  . pantoprazole (PROTONIX) 40 MG tablet Take 1 tablet (40 mg total) by mouth daily.  90 tablet  3  . prochlorperazine (COMPAZINE) 10 MG tablet Take 1 tablet (10 mg total) by mouth every 6 (six) hours as needed.  60 tablet  2  . ramipril (ALTACE) 5 MG capsule TAKE 1 CAPSULE BY MOUTH DAILY  90 capsule  1  .  spironolactone (ALDACTONE) 25 MG tablet 1 tablet daily  90 tablet  1  . sucralfate (CARAFATE) 1 G tablet Take 1 tablet (1 g total) by mouth 4 (four) times daily.  120 tablet  2   No current facility-administered medications for this encounter.    Physical Findings: The patient is in no acute distress. Patient is alert and oriented.  weight is 156 lb 12.8 oz (71.124 kg). His oral temperature is 97.8 F (36.6 C). His blood pressure is 96/45 and his pulse is 77. His respiration is 20 and oxygen saturation is 100%. .     Lab Findings: Lab Results  Component Value Date   WBC 5.6 10/06/2013   HGB 8.3* 10/06/2013   HCT 25.3* 10/06/2013   MCV 94.8 10/06/2013   PLT 135* 10/06/2013     Radiographic Findings: Ct Abdomen Pelvis Wo Contrast  09/25/2013   CLINICAL DATA:  Restaging esophageal carcinoma  EXAM: CT CHEST, ABDOMEN AND PELVIS WITHOUT CONTRAST  TECHNIQUE: Multidetector CT imaging of the chest, abdomen and pelvis was performed following the standard protocol without IV contrast.  COMPARISON:  PET-CT 07/23/2013  FINDINGS: CT CHEST FINDINGS  No significant pleural effusion noted. No  airspace consolidation identified. Similar appearance of clustered tree-in-bud nodules within the right middle lobe, image 41/ series 4. No suspicious pulmonary nodule or mass identified. Next  The heart size appears normal. The patient is status post median sternotomy and CABG. There is a left chest wall AICD with lead in the right ventricle. No mediastinal or hilar adenopathy identified. Interval improvement in concentric distal esophageal mass. This measures up to 1.6 cm, image 50/ series 2. Previously this measured 2.2 cm.  No axillary or supraclavicular adenopathy. 2.3 cm low attenuation nodule is identified within the right lobe of thyroid gland.  Review of the visualized osseous structures is significant for mild multilevel degenerative disc disease within the lumbar spine. No aggressive lytic or sclerotic bone lesions.  CT ABDOMEN AND PELVIS FINDINGS  Stable low attenuation lesion within the dome of liver measuring 8 mm. This is too small to reliably characterize, image 51/ series 2. Lesion within the inferior tip of the liver is again noted measuring 8 mm. This is stable from previous exam and remains too small to characterize. Tiny low-attenuation structure within the inferior right hepatic lobe measures 5 mm. This is too small to characterize. Multiple stones are identified within the gallbladder. No biliary dilatation. Normal appearance of the pancreas. The spleen is unremarkable.  The adrenal glands are both normal. Small bilateral renal calculi are identified. The urinary bladder appears normal. Prostate gland is enlarged measuring 5 cm, image 122/ series 2.  There is advanced calcified atherosclerotic disease affecting the abdominal aorta. No aneurysm. Gastrohepatic ligament lymph node 1.1 cm, image 57/ series 2. This is compared with 2.4 cm previously. Adjacent gastrohepatic ligament lymph node measures 1.9 cm, image 57/ series 2. Previously 3.7 cm. Resolution of previous left celiac axis lymph node.  The para-aortic lymph node has also resolved. There is no free fluid or fluid collections identified within the abdomen or pelvis.  The stomach appears normal. The small bowel loops have a normal course and caliber without evidence for obstruction. Normal appearance of the colon.  Review of the visualized osseous structures is significant for mild multilevel degenerative disc disease. No aggressive lytic or sclerotic bone lesions.  IMPRESSION: CT CHEST IMPRESSION  1. Interval decrease in size of distal esophageal mass. 2. No new or progressive disease identified within the  chest.  CT ABDOMEN AND PELVIS IMPRESSION  1. Interval response to therapy. There has been significant decrease in size of metastatic adenopathy within the upper abdomen.  2. No new or progressive disease identified within the abdomen or pelvis.   Electronically Signed   By: Signa Kell M.D.   On: 09/25/2013 16:16   Ct Chest Wo Contrast  09/25/2013   CLINICAL DATA:  Restaging esophageal carcinoma  EXAM: CT CHEST, ABDOMEN AND PELVIS WITHOUT CONTRAST  TECHNIQUE: Multidetector CT imaging of the chest, abdomen and pelvis was performed following the standard protocol without IV contrast.  COMPARISON:  PET-CT 07/23/2013  FINDINGS: CT CHEST FINDINGS  No significant pleural effusion noted. No airspace consolidation identified. Similar appearance of clustered tree-in-bud nodules within the right middle lobe, image 41/ series 4. No suspicious pulmonary nodule or mass identified. Next  The heart size appears normal. The patient is status post median sternotomy and CABG. There is a left chest wall AICD with lead in the right ventricle. No mediastinal or hilar adenopathy identified. Interval improvement in concentric distal esophageal mass. This measures up to 1.6 cm, image 50/ series 2. Previously this measured 2.2 cm.  No axillary or supraclavicular adenopathy. 2.3 cm low attenuation nodule is identified within the right lobe of thyroid gland.  Review  of the visualized osseous structures is significant for mild multilevel degenerative disc disease within the lumbar spine. No aggressive lytic or sclerotic bone lesions.  CT ABDOMEN AND PELVIS FINDINGS  Stable low attenuation lesion within the dome of liver measuring 8 mm. This is too small to reliably characterize, image 51/ series 2. Lesion within the inferior tip of the liver is again noted measuring 8 mm. This is stable from previous exam and remains too small to characterize. Tiny low-attenuation structure within the inferior right hepatic lobe measures 5 mm. This is too small to characterize. Multiple stones are identified within the gallbladder. No biliary dilatation. Normal appearance of the pancreas. The spleen is unremarkable.  The adrenal glands are both normal. Small bilateral renal calculi are identified. The urinary bladder appears normal. Prostate gland is enlarged measuring 5 cm, image 122/ series 2.  There is advanced calcified atherosclerotic disease affecting the abdominal aorta. No aneurysm. Gastrohepatic ligament lymph node 1.1 cm, image 57/ series 2. This is compared with 2.4 cm previously. Adjacent gastrohepatic ligament lymph node measures 1.9 cm, image 57/ series 2. Previously 3.7 cm. Resolution of previous left celiac axis lymph node. The para-aortic lymph node has also resolved. There is no free fluid or fluid collections identified within the abdomen or pelvis.  The stomach appears normal. The small bowel loops have a normal course and caliber without evidence for obstruction. Normal appearance of the colon.  Review of the visualized osseous structures is significant for mild multilevel degenerative disc disease. No aggressive lytic or sclerotic bone lesions.  IMPRESSION: CT CHEST IMPRESSION  1. Interval decrease in size of distal esophageal mass. 2. No new or progressive disease identified within the chest.  CT ABDOMEN AND PELVIS IMPRESSION  1. Interval response to therapy. There has  been significant decrease in size of metastatic adenopathy within the upper abdomen.  2. No new or progressive disease identified within the abdomen or pelvis.   Electronically Signed   By: Signa Kell M.D.   On: 09/25/2013 16:16    Impression:    The patient is recovering satisfactorily from his course of radiation treatment. His CT imaging showed some improvement in his disease and he is  continuing with chemotherapy at this time.  Plan:  Followup in 5 months.   Radene Gunning, M.D., Ph.D.

## 2013-10-19 ENCOUNTER — Other Ambulatory Visit: Payer: Self-pay | Admitting: Oncology

## 2013-10-20 ENCOUNTER — Telehealth: Payer: Self-pay | Admitting: Oncology

## 2013-10-20 ENCOUNTER — Encounter: Payer: Self-pay | Admitting: *Deleted

## 2013-10-20 ENCOUNTER — Observation Stay (HOSPITAL_COMMUNITY)
Admission: EM | Admit: 2013-10-20 | Discharge: 2013-10-22 | Disposition: A | Discharge status: Home / Self Care | Payer: Medicare Other | Attending: Family Medicine | Admitting: Family Medicine

## 2013-10-20 ENCOUNTER — Telehealth: Payer: Self-pay | Admitting: Endocrinology

## 2013-10-20 ENCOUNTER — Emergency Department (HOSPITAL_COMMUNITY): Payer: Medicare Other

## 2013-10-20 ENCOUNTER — Ambulatory Visit (HOSPITAL_BASED_OUTPATIENT_CLINIC_OR_DEPARTMENT_OTHER): Payer: Medicare Other | Admitting: Oncology

## 2013-10-20 ENCOUNTER — Other Ambulatory Visit (HOSPITAL_BASED_OUTPATIENT_CLINIC_OR_DEPARTMENT_OTHER): Payer: Medicare Other | Admitting: Lab

## 2013-10-20 ENCOUNTER — Encounter (HOSPITAL_COMMUNITY): Payer: Self-pay | Admitting: Emergency Medicine

## 2013-10-20 ENCOUNTER — Telehealth: Payer: Self-pay | Admitting: *Deleted

## 2013-10-20 VITALS — BP 102/40 | HR 59 | Temp 97.1°F | Resp 18 | Ht 67.0 in | Wt 156.7 lb

## 2013-10-20 DIAGNOSIS — C7A1 Malignant poorly differentiated neuroendocrine tumors: Secondary | ICD-10-CM

## 2013-10-20 DIAGNOSIS — R1310 Dysphagia, unspecified: Secondary | ICD-10-CM

## 2013-10-20 DIAGNOSIS — E109 Type 1 diabetes mellitus without complications: Secondary | ICD-10-CM

## 2013-10-20 DIAGNOSIS — I5022 Chronic systolic (congestive) heart failure: Secondary | ICD-10-CM | POA: Diagnosis present

## 2013-10-20 DIAGNOSIS — N529 Male erectile dysfunction, unspecified: Secondary | ICD-10-CM | POA: Insufficient documentation

## 2013-10-20 DIAGNOSIS — Z7982 Long term (current) use of aspirin: Secondary | ICD-10-CM | POA: Insufficient documentation

## 2013-10-20 DIAGNOSIS — C155 Malignant neoplasm of lower third of esophagus: Secondary | ICD-10-CM

## 2013-10-20 DIAGNOSIS — C159 Malignant neoplasm of esophagus, unspecified: Secondary | ICD-10-CM | POA: Diagnosis present

## 2013-10-20 DIAGNOSIS — F3289 Other specified depressive episodes: Secondary | ICD-10-CM | POA: Insufficient documentation

## 2013-10-20 DIAGNOSIS — F411 Generalized anxiety disorder: Secondary | ICD-10-CM | POA: Insufficient documentation

## 2013-10-20 DIAGNOSIS — E1139 Type 2 diabetes mellitus with other diabetic ophthalmic complication: Secondary | ICD-10-CM | POA: Insufficient documentation

## 2013-10-20 DIAGNOSIS — I10 Essential (primary) hypertension: Secondary | ICD-10-CM | POA: Insufficient documentation

## 2013-10-20 DIAGNOSIS — D509 Iron deficiency anemia, unspecified: Secondary | ICD-10-CM | POA: Diagnosis present

## 2013-10-20 DIAGNOSIS — I2510 Atherosclerotic heart disease of native coronary artery without angina pectoris: Secondary | ICD-10-CM | POA: Diagnosis present

## 2013-10-20 DIAGNOSIS — F329 Major depressive disorder, single episode, unspecified: Secondary | ICD-10-CM | POA: Insufficient documentation

## 2013-10-20 DIAGNOSIS — I25810 Atherosclerosis of coronary artery bypass graft(s) without angina pectoris: Secondary | ICD-10-CM

## 2013-10-20 DIAGNOSIS — R972 Elevated prostate specific antigen [PSA]: Secondary | ICD-10-CM | POA: Insufficient documentation

## 2013-10-20 DIAGNOSIS — I2589 Other forms of chronic ischemic heart disease: Secondary | ICD-10-CM | POA: Insufficient documentation

## 2013-10-20 DIAGNOSIS — D6481 Anemia due to antineoplastic chemotherapy: Secondary | ICD-10-CM

## 2013-10-20 DIAGNOSIS — D649 Anemia, unspecified: Secondary | ICD-10-CM

## 2013-10-20 DIAGNOSIS — E119 Type 2 diabetes mellitus without complications: Secondary | ICD-10-CM

## 2013-10-20 DIAGNOSIS — D696 Thrombocytopenia, unspecified: Secondary | ICD-10-CM

## 2013-10-20 DIAGNOSIS — D126 Benign neoplasm of colon, unspecified: Secondary | ICD-10-CM

## 2013-10-20 DIAGNOSIS — Z951 Presence of aortocoronary bypass graft: Secondary | ICD-10-CM | POA: Insufficient documentation

## 2013-10-20 DIAGNOSIS — R296 Repeated falls: Secondary | ICD-10-CM | POA: Insufficient documentation

## 2013-10-20 DIAGNOSIS — Z9229 Personal history of other drug therapy: Secondary | ICD-10-CM

## 2013-10-20 DIAGNOSIS — E785 Hyperlipidemia, unspecified: Secondary | ICD-10-CM | POA: Insufficient documentation

## 2013-10-20 DIAGNOSIS — S0990XA Unspecified injury of head, initial encounter: Principal | ICD-10-CM

## 2013-10-20 DIAGNOSIS — E11329 Type 2 diabetes mellitus with mild nonproliferative diabetic retinopathy without macular edema: Secondary | ICD-10-CM | POA: Insufficient documentation

## 2013-10-20 DIAGNOSIS — R51 Headache: Secondary | ICD-10-CM | POA: Insufficient documentation

## 2013-10-20 DIAGNOSIS — Z95810 Presence of automatic (implantable) cardiac defibrillator: Secondary | ICD-10-CM | POA: Diagnosis present

## 2013-10-20 DIAGNOSIS — C7B8 Other secondary neuroendocrine tumors: Secondary | ICD-10-CM

## 2013-10-20 LAB — CBC WITH DIFFERENTIAL/PLATELET
BASO%: 0.5 % (ref 0.0–2.0)
Basophils Absolute: 0 10*3/uL (ref 0.0–0.1)
Basophils Absolute: 0 10*3/uL (ref 0.0–0.1)
Basophils Relative: 0 % (ref 0–1)
EOS%: 1 % (ref 0.0–7.0)
Eosinophils Absolute: 0 10*3/uL (ref 0.0–0.5)
Eosinophils Relative: 1 % (ref 0–5)
HCT: 20.7 % — ABNORMAL LOW (ref 38.4–49.9)
HCT: 19.7 % — ABNORMAL LOW (ref 39.0–52.0)
HGB: 6.7 g/dL — CL (ref 13.0–17.1)
Hemoglobin: 6.7 g/dL — CL (ref 13.0–17.0)
LYMPH%: 18.6 % (ref 14.0–49.0)
Lymphocytes Relative: 17 % (ref 12–46)
MCH: 31 pg (ref 27.2–33.4)
MCHC: 32.4 g/dL (ref 32.0–36.0)
MCHC: 34 g/dL (ref 30.0–36.0)
MCV: 95.8 fL (ref 79.3–98.0)
MCV: 94.7 fL (ref 78.0–100.0)
MONO%: 14.5 % — ABNORMAL HIGH (ref 0.0–14.0)
Monocytes Absolute: 0.7 10*3/uL (ref 0.1–1.0)
Monocytes Relative: 13 % — ABNORMAL HIGH (ref 3–12)
NEUT%: 65.4 % (ref 39.0–75.0)
Platelets: 33 10*3/uL — ABNORMAL LOW (ref 140–400)
RDW: 20.9 % — ABNORMAL HIGH (ref 11.5–15.5)
WBC: 3.9 10*3/uL — ABNORMAL LOW (ref 4.0–10.3)
WBC: 5.1 10*3/uL (ref 4.0–10.5)
lymph#: 0.7 10*3/uL — ABNORMAL LOW (ref 0.9–3.3)

## 2013-10-20 LAB — BASIC METABOLIC PANEL
BUN: 16 mg/dL (ref 6–23)
CO2: 26 mEq/L (ref 19–32)
Calcium: 8.8 mg/dL (ref 8.4–10.5)
Chloride: 102 mEq/L (ref 96–112)
Creatinine, Ser: 1.14 mg/dL (ref 0.50–1.35)
Glucose, Bld: 175 mg/dL — ABNORMAL HIGH (ref 70–99)
Potassium: 4.4 mEq/L (ref 3.5–5.1)

## 2013-10-20 LAB — GLUCOSE, CAPILLARY: Glucose-Capillary: 167 mg/dL — ABNORMAL HIGH (ref 70–99)

## 2013-10-20 MED ORDER — INSULIN LISPRO 100 UNIT/ML ~~LOC~~ SOLN: SUBCUTANEOUS | Status: DC

## 2013-10-20 NOTE — Telephone Encounter (Signed)
Pt states he has been having issues with his Humalog  Needs new Rx  Needs nurse to call ASAP  Thank you :)

## 2013-10-20 NOTE — Progress Notes (Signed)
   [facility]    OFFICE PROGRESS NOTE   INTERVAL HISTORY:   He returns for scheduled followup of esophagus cancer. He completed a cycle of etoposide/carboplatin chemotherapy beginning on 10/06/2013. He received Neulasta support. He denies nausea/vomiting, mouth sores, and bone pain following chemotherapy. He feels well. Occasional dysphagia, but his swallowing is much improved. No pain. No dyspnea.  Objective:  Vital signs in last 24 hours:  Blood pressure 102/40, pulse 59, temperature 97.1 F (36.2 C), temperature source Oral, resp. rate 18, height 5\' 7"  (1.702 m), weight 156 lb 11.2 oz (71.079 kg).    HEENT: No thrush or ulcers Resp: Lungs clear bilaterally Cardio: Regular rate and rhythm, 2/6 systolic murmur GI: No hepatosplenomegaly, nontender Vascular: No leg edema   Lab Results:  Lab Results  Component Value Date   WBC 3.9* 10/20/2013   HGB 6.7* 10/20/2013   HCT 20.7* 10/20/2013   MCV 95.8 10/20/2013   PLT 33* 10/20/2013   ANC 2.6   Medications: I have reviewed the patient's current medications.  Assessment/Plan: 1. High-grade neuroendocrine carcinoma of the esophagus, clinical stage III. Staging PET scan 07/23/2013 showed a large distal esophageal mass with neoplastic range FDG uptake, metastatic upper abdominal lymphadenopathy, indeterminate small pulmonary nodules. -initiation of radiation on 08/06/2013. Radiation completed 09/12/2013.  -Initiation of weekly carboplatin 08/07/2013. Final weekly carboplatin 08/28/2013.  -Restaging CT of the chest, abdomen, and pelvis on 09/25/2013: Decreased esophagus mass, decreased upper abdomen adenopathy. No new or progressive disease per -Cycle 1 etoposide/carboplatin 10/06/2013 2. Dysphagia secondary to #1. Improved. 3. History of coronary artery disease. 4. Congestive heart failure. 5. Diabetes. 6. History of Weight loss. Weight is stable. 7. Mild hypotension.  8. Anemia secondary to malnutrition and  chemotherapy, progressive 9. Thrombocytopenia secondary to chemotherapy.   Disposition:  He completed one cycle of etoposide/carboplatin chemotherapy. He tolerated the chemotherapy well, but he has developed hematologic toxicity. He will discontinue aspirin and Plavix. He knows to contact us for spontaneous bleeding or bruising. He appears asymptomatic from the anemia. He will return for a CBC on 10/22/2013. We will arrange for transfusion support if the hemoglobin is lower.  Mr. Schue is scheduled for an office visit and cycle 2 of etoposide/carboplatin on 11/03/2013.   Thornton Papas, MD  10/20/2013  1:31 PM

## 2013-10-20 NOTE — Progress Notes (Signed)
Plavix and ASA on hold per Dr. Truett Perna for platelet count 33,000. Will recheck on 11/26. In Basket message to Dr. Everardo All at request of Dr. Truett Perna to make him aware.

## 2013-10-20 NOTE — Telephone Encounter (Signed)
Per staff message and POF I have scheduled appts.  JMW  

## 2013-10-20 NOTE — Telephone Encounter (Signed)
Gave pt appt for lab,md and chemo for december 2014

## 2013-10-20 NOTE — ED Provider Notes (Signed)
CSN: [id number]     Arrival date & time 10/20/13  1650 History   First MD Initiated Contact with Patient 10/20/13 1655     Chief Complaint  Patient presents with  . Fall  . Abrasion   (Consider location/radiation/quality/duration/timing/severity/associated sxs/prior Treatment) HPI  This is a 72 year old male who presents following a fall. Patient is currently being treated for esophageal cancer. He is thrombocytopenic with a platelet count of 35. He last took a dose of Plavix this morning. He discontinue Plavix and aspirin use as of today per his oncologist. [place]. He fell and hit the left side of his face. He did not lose consciousness. He is been awake, alert, and oriented. He denies any other injury. He has been ambulatory since the fall. He denies any chest pain or shortness of breath. He denies any dizziness, weakness, numbness, or tingling.  Past Medical History  Diagnosis Date  . Dyslipidemia   . Hypertension   . History of colonic polyps   . Thrombocytopenia   . Nonproliferative diabetic retinopathy NOS(362.03)   . Impotence of organic origin   . Iron deficiency anemia, unspecified   . Unspecified hereditary and idiopathic peripheral neuropathy   . Diabetes mellitus   . Depressive disorder, not elsewhere classified   . Coronary artery disease     a. s/p CABG 1997; b. LHC 04/2007: Distal left main 80% treated with PCI (Cypher DES), LIMA-LAD patent, SVG-OM patent, SVG-PDA patent, EF 30% with inferior HK  . Chronic systolic heart failure     a. Echo 09/2011: EF 30-35%, apical AK, basal inferior AK, mild AI, moderate MR, moderate LAE  . Anxiety state, unspecified   . Ischemic cardiomyopathy   . S/P ICD (internal cardiac defibrillator) procedure   . Esophageal cancer   . Elevated PSA   . History of radiation therapy 08/06/13-09/12/13    esophageal   Past Surgical History  Procedure Laterality Date  . Coronary  artery bypass graft      X 3  . Cardiac catheterization  05/23/07  . Insert / replace / remove pacemaker  07/05/07    [employer] SINGLE-CHAMBER DEFIBRILLATOR, [name], MD  . Esophagogastroduodenoscopy N/A 07/08/2013    Procedure: ESOPHAGOGASTRODUODENOSCOPY (EGD) with possible Dilation.;  Surgeon: Louis Meckel, MD;  Location: [facility];  Service: Endoscopy;  Laterality: N/A;   No family history on file. History  Substance Use Topics  . Smoking status: Former Smoker -- 2 years    Types: Cigarettes    Quit date: 11/27/1994  . Smokeless tobacco: Never Used  . Alcohol Use: No    Review of Systems  Constitutional: Negative.  Negative for fever.  Eyes: Negative for visual disturbance.  Respiratory: Negative.  Negative for chest tightness and shortness of breath.   Cardiovascular: Negative.  Negative for chest pain.  Gastrointestinal: Negative.  Negative for abdominal pain.  Genitourinary: Negative.  Negative for dysuria.  Skin: Positive for wound.  Neurological: Negative for dizziness, weakness, light-headedness, numbness and headaches.  All other systems reviewed and are negative.    Allergies  Review of patient's allergies indicates no known allergies.  Home Medications   No current outpatient prescriptions on file. BP 114/52  Pulse 77  Temp(Src) 98.9 F (37.2 C) (Oral)  Resp 20  SpO2 100% Physical Exam  Nursing note and vitals reviewed. Constitutional: He is oriented to person, place, and time. No distress.  Elderly  HENT:  Head: Normocephalic.  Abrasion with mild swelling noted to the left eye and cheek  Eyes: Conjunctivae and EOM are normal. Pupils are equal, round, and reactive to light.  Neck: Neck supple.  Cardiovascular: Normal rate, regular rhythm and normal heart sounds.   No murmur heard. Pulmonary/Chest: Effort normal and breath sounds normal. No respiratory distress.  Abdominal: Soft. There is no tenderness.  Musculoskeletal: Normal range of motion.  He exhibits no edema.  Lymphadenopathy:    He has no cervical adenopathy.  Neurological: He is alert and oriented to person, place, and time.  Skin: Skin is warm and dry.  Abrasion over the left shoulder and left knee. Multiple skin tears over the bilateral arms  Psychiatric: He has a normal mood and affect.    ED Course  Procedures (including critical care time) Labs Review Labs Reviewed  CBC WITH DIFFERENTIAL - Abnormal; Notable for the following:    RBC 2.08 (*)    Hemoglobin 6.7 (*)    HCT 19.7 (*)    RDW 20.9 (*)    Platelets 35 (*)    Monocytes Relative 13 (*)    All other components within normal limits  BASIC METABOLIC PANEL - Abnormal; Notable for the following:    Glucose, Bld 175 (*)    GFR calc non Af Amer 63 (*)    GFR calc Af Amer 73 (*)    All other components within normal limits  GLUCOSE, CAPILLARY - Abnormal; Notable for the following:    Glucose-Capillary 167 (*)    All other components within normal limits   Imaging Review Ct Head Wo Contrast  10/20/2013   CLINICAL DATA:  72 year old male with head injury and headache. Patient on Plavix and aspirin.  EXAM: CT HEAD WITHOUT CONTRAST  TECHNIQUE: Contiguous axial images were obtained from the base of the skull through the vertex without intravenous contrast.  COMPARISON:  07/23/2013 PET-CT.  FINDINGS: No acute intracranial abnormalities are identified, including mass lesion or mass effect, hydrocephalus, extra-axial fluid collection, midline shift, hemorrhage, or acute infarction. The visualized bony calvarium is unremarkable.  IMPRESSION: No evidence of acute intracranial abnormality.   Electronically Signed   By: Laveda Abbe M.D.   On: 10/20/2013 18:15   Dg Shoulder Left  10/20/2013   CLINICAL DATA:  Fall.  Left shoulder pain.  EXAM: LEFT SHOULDER - 2+ VIEW  COMPARISON:  CT chest 09/25/2013.  FINDINGS: The left shoulder is located. Degenerative changes are noted at the [facility] joint. The clavicle is intact. No acute  bone or soft tissue abnormalities are present. The visualized hemi thorax is clear. The patient is status post median sternotomy for CABG. An AICD is in place.  IMPRESSION: 1. No acute abnormality of the shoulder. 2. AICD. 3. Status post CABG.   Electronically Signed   By: Gennette Pac M.D.   On: 10/20/2013 17:42   Dg Knee Complete 4 Views Left  10/20/2013   CLINICAL DATA:  Fall.  Left knee pain.  EXAM: LEFT KNEE - COMPLETE 4+ VIEW  COMPARISON:  None.  FINDINGS: Mild osteopenia is present. Small vessel atherosclerotic changes are evident. The knee is located. No significant effusion is present. No radiopaque foreign body is evident. Surgical clips are noted along the medial aspect of the proximal tibia.  IMPRESSION: 1. No acute osseous abnormality or significant effusion. 2. Atherosclerotic changes including small vessel disease compatible with the patient's known diagnosis of diabetes. 3. Mild osteopenia.   Electronically Signed   By: Toribio Harbour.D.  On: 10/20/2013 17:43    EKG Interpretation    Date/Time:    Ventricular Rate:    PR Interval:    QRS Duration:   QT Interval:    QTC Calculation:   R Axis:     Text Interpretation:              MDM   1. Head injury, initial encounter   2. HX: anticoagulation   3. Thrombocytopenia     Patient presents following a fall. Has evidence of trauma to the left side of his face. He is awake, alert, oriented and neurologically intact. Patient is currently thrombocytopenic and is anticoagulated with Plavix. Plain films of the shoulder and knee were obtained as well as basic labwork. CT scan of the head was obtained and is negative for acute bleed. Patient has remained neurologically stable. Given his current thrombocytopenia in addition to anticoagulation, patient is at risk for delayed bleed. I feel a trial of observation and possible repeat head CT is warranted.    Shon Baton, MD 10/20/13 2329

## 2013-10-20 NOTE — Progress Notes (Signed)
Denies any bleeding or unexplained bruises. No increase in weakness or fatigue. No dyspnea.

## 2013-10-20 NOTE — ED Notes (Addendum)
Pt arrived by [facility] after tripping over curb landing on left side and hitting head. No LOC and denies pain. Pt has hx of esophogeal cancer and was seen by cancer MD today and they told him his platelets were low and was told to d/c Plavix and Asa. Pt last took Plavix this morning and Asa last night.

## 2013-10-20 NOTE — Telephone Encounter (Signed)
Pt stated that the insulin had been sent originally to incorrect pharmacy.  They had called & cancelled it.  Refill request was sent to [facility] at this time at patient request.

## 2013-10-21 ENCOUNTER — Telehealth: Payer: Self-pay | Admitting: *Deleted

## 2013-10-21 ENCOUNTER — Encounter (HOSPITAL_COMMUNITY): Payer: Self-pay | Admitting: Internal Medicine

## 2013-10-21 ENCOUNTER — Other Ambulatory Visit: Payer: Self-pay | Admitting: *Deleted

## 2013-10-21 ENCOUNTER — Observation Stay (HOSPITAL_COMMUNITY): Payer: Medicare Other

## 2013-10-21 DIAGNOSIS — S0990XA Unspecified injury of head, initial encounter: Principal | ICD-10-CM

## 2013-10-21 DIAGNOSIS — D696 Thrombocytopenia, unspecified: Secondary | ICD-10-CM

## 2013-10-21 DIAGNOSIS — D649 Anemia, unspecified: Secondary | ICD-10-CM

## 2013-10-21 DIAGNOSIS — I25810 Atherosclerosis of coronary artery bypass graft(s) without angina pectoris: Secondary | ICD-10-CM

## 2013-10-21 DIAGNOSIS — E109 Type 1 diabetes mellitus without complications: Secondary | ICD-10-CM

## 2013-10-21 LAB — CBC WITH DIFFERENTIAL/PLATELET
Basophils Absolute: 0 10*3/uL (ref 0.0–0.1)
Basophils Relative: 1 % (ref 0–1)
Eosinophils Absolute: 0 10*3/uL (ref 0.0–0.7)
HCT: 18.6 % — ABNORMAL LOW (ref 39.0–52.0)
Hemoglobin: 6.1 g/dL — CL (ref 13.0–17.0)
Lymphs Abs: 0.8 10*3/uL (ref 0.7–4.0)
MCH: 31.6 pg (ref 26.0–34.0)
MCHC: 32.8 g/dL (ref 30.0–36.0)
MCV: 96.4 fL (ref 78.0–100.0)
Monocytes Absolute: 0.7 10*3/uL (ref 0.1–1.0)
Monocytes Relative: 16 % — ABNORMAL HIGH (ref 3–12)
Neutro Abs: 3 10*3/uL (ref 1.7–7.7)
Neutrophils Relative %: 66 % (ref 43–77)
Platelets: 35 10*3/uL — ABNORMAL LOW (ref 150–400)
RDW: 21.3 % — ABNORMAL HIGH (ref 11.5–15.5)
WBC: 4.5 10*3/uL (ref 4.0–10.5)

## 2013-10-21 LAB — BASIC METABOLIC PANEL
BUN: 15 mg/dL (ref 6–23)
CO2: 26 mEq/L (ref 19–32)
Calcium: 8.5 mg/dL (ref 8.4–10.5)
Creatinine, Ser: 0.98 mg/dL (ref 0.50–1.35)
GFR calc non Af Amer: 81 mL/min — ABNORMAL LOW (ref >90)
Glucose, Bld: 118 mg/dL — ABNORMAL HIGH (ref 70–99)
Potassium: 3.9 mEq/L (ref 3.5–5.1)
Sodium: 137 mEq/L (ref 135–145)

## 2013-10-21 LAB — GLUCOSE, CAPILLARY
Glucose-Capillary: 125 mg/dL — ABNORMAL HIGH (ref 70–99)
Glucose-Capillary: 176 mg/dL — ABNORMAL HIGH (ref 70–99)
Glucose-Capillary: 256 mg/dL — ABNORMAL HIGH (ref 70–99)

## 2013-10-21 LAB — PREPARE RBC (CROSSMATCH)

## 2013-10-21 MED ORDER — ATORVASTATIN CALCIUM 80 MG PO TABS
80.0000 mg | ORAL_TABLET | Freq: Every day | ORAL | Status: DC
  Administered 2013-10-21: 80 mg via ORAL
  Filled 2013-10-21 (×2): qty 1

## 2013-10-21 MED ORDER — NITROGLYCERIN 0.4 MG SL SUBL: 0.4000 mg | SUBLINGUAL_TABLET | SUBLINGUAL | Status: DC | PRN

## 2013-10-21 MED ORDER — ONDANSETRON HCL 4 MG PO TABS: 4.0000 mg | ORAL_TABLET | Freq: Four times a day (QID) | ORAL | Status: DC | PRN

## 2013-10-21 MED ORDER — RAMIPRIL 5 MG PO CAPS
5.0000 mg | ORAL_CAPSULE | Freq: Every day | ORAL | Status: DC
  Filled 2013-10-21 (×2): qty 1

## 2013-10-21 MED ORDER — ADULT MULTIVITAMIN W/MINERALS CH
1.0000 | ORAL_TABLET | Freq: Every day | ORAL | Status: DC
  Administered 2013-10-21: 1 via ORAL
  Filled 2013-10-21 (×2): qty 1

## 2013-10-21 MED ORDER — INSULIN ASPART 100 UNIT/ML ~~LOC~~ SOLN
0.0000 [IU] | Freq: Three times a day (TID) | SUBCUTANEOUS | Status: DC
  Administered 2013-10-21 (×2): 2 [IU] via SUBCUTANEOUS
  Administered 2013-10-21: 1 [IU] via SUBCUTANEOUS
  Administered 2013-10-22: 2 [IU] via SUBCUTANEOUS
  Administered 2013-10-22: 1 [IU] via SUBCUTANEOUS

## 2013-10-21 MED ORDER — INSULIN LISPRO 100 UNIT/ML ~~LOC~~ SOLN: SUBCUTANEOUS | Status: DC

## 2013-10-21 MED ORDER — CITALOPRAM HYDROBROMIDE 10 MG PO TABS
10.0000 mg | ORAL_TABLET | Freq: Every day | ORAL | Status: DC
  Administered 2013-10-21 – 2013-10-22 (×2): 10 mg via ORAL
  Filled 2013-10-21 (×2): qty 1

## 2013-10-21 MED ORDER — FUROSEMIDE 80 MG PO TABS
80.0000 mg | ORAL_TABLET | Freq: Every day | ORAL | Status: DC
  Filled 2013-10-21 (×2): qty 1

## 2013-10-21 MED ORDER — SODIUM CHLORIDE 0.9 % IJ SOLN: 3.0000 mL | Freq: Two times a day (BID) | INTRAMUSCULAR | Status: DC

## 2013-10-21 MED ORDER — SUCRALFATE 1 G PO TABS
1.0000 g | ORAL_TABLET | Freq: Four times a day (QID) | ORAL | Status: DC
  Administered 2013-10-21 – 2013-10-22 (×3): 1 g via ORAL
  Filled 2013-10-21 (×8): qty 1

## 2013-10-21 MED ORDER — ACETAMINOPHEN 325 MG PO TABS: 650.0000 mg | ORAL_TABLET | Freq: Four times a day (QID) | ORAL | Status: DC | PRN

## 2013-10-21 MED ORDER — SPIRONOLACTONE 25 MG PO TABS
25.0000 mg | ORAL_TABLET | Freq: Every day | ORAL | Status: DC
  Filled 2013-10-21 (×2): qty 1

## 2013-10-21 MED ORDER — ACETAMINOPHEN 650 MG RE SUPP: 650.0000 mg | Freq: Four times a day (QID) | RECTAL | Status: DC | PRN

## 2013-10-21 MED ORDER — ONDANSETRON HCL 4 MG/2ML IJ SOLN: 4.0000 mg | Freq: Four times a day (QID) | INTRAMUSCULAR | Status: DC | PRN

## 2013-10-21 MED ORDER — CARVEDILOL 12.5 MG PO TABS
12.5000 mg | ORAL_TABLET | Freq: Two times a day (BID) | ORAL | Status: DC
  Filled 2013-10-21 (×5): qty 1

## 2013-10-21 MED ORDER — FUROSEMIDE 10 MG/ML IJ SOLN
20.0000 mg | Freq: Once | INTRAMUSCULAR | Status: AC
  Administered 2013-10-21: 20 mg via INTRAVENOUS
  Filled 2013-10-21: qty 2

## 2013-10-21 NOTE — Telephone Encounter (Signed)
i'm glad you are feeling better. Let's do the blood tests on the day of your visit, because there may be several different types you need.

## 2013-10-21 NOTE — Telephone Encounter (Signed)
Pt's wife called and scheduled a follow up appt and a lab appt. Please put in lab orders for pt. Thank you.

## 2013-10-21 NOTE — Progress Notes (Signed)
Consent obtain for blood transfusion.  Administration started, vitals stable, no reactions to transfusion.  Will continue to monitor patient.  Call bell placed within reach.

## 2013-10-21 NOTE — Progress Notes (Signed)
UR completed 

## 2013-10-21 NOTE — Discharge Summary (Addendum)
Physician Discharge Summary  Billy Bush:[id number] DOB: 1941-06-23 DOA: 10/20/2013  PCP: Romero Belling, MD  Admit date: 10/20/2013 Discharge date: 10/21/2013  Time spent: >35 minutes  Recommendations for Outpatient Follow-up:  F/u with hem/onc outpatient as scheduled  F/u with PCP in 1-2 weeks as needed   Discharge Diagnoses:  Principal Problem:   Head injury Active Problems:   DIABETES MELLITUS, TYPE I   ANEMIA, IRON DEFICIENCY   CORONARY ARTERY DISEASE   SYSTOLIC HEART FAILURE, CHRONIC   ICD-St.Jude   Esophageal carcinoma   Thrombocytopenia   Discharge Condition: stable   Diet recommendation: heart healthy  There were no vitals filed for this visit.  History of present illness:  Brief summary  1. 72 y/o male with PMH HTN, HPL, CAD s/p CABG, PTCA, DM, anemia, CHF s/p ICD, and esophageal CA s/p radiation on active chemotherapy presented with mechanical fall, trauma; [place] hitting his head. Patient did not lose consciousness but did not have any chest pain or shortness of breath. He sustained bruises on his forehead left hand and knee. CT head and x-rays do not show anything acute; He is found with progressive anemia and was transfused 2 units packed red cells  Hospital Course:  1. Fall mechanical; neuro exam non focal; CT head: no acute findings; ambulating well.   2. Progressive anemia, cytopenia likely chemo induced; no s/s of active bleeding; Hg 6.1; TF two units repeat Hg 8.4 -f/u with hem/onc outpatient  -will need repeat CBC 10/27/13 at Onc office 3. CAD s/p CABG, PTCA; cont home regimen-at present time patient is to hold off on aspirin/Plavix because of low platelet count, risk for extension of bleeding. This has been discussed with the patient by his oncologist Dr. Truett Perna on morning of discharge 4. CHF s/p ICD; clinically euvolemic; LVEF 30% (2012)  -cont home regimen; cont diuresis PO  I will-Patient had  slightly low blood pressure during hospital stay but this apparently is normal  patient's son since initiation of chemotherapy 5. DM HA1C-6.6 (12/2012)  -cont insulin regimen 6. Esophageal CA, on chemo; s/p radiation; per hem/onc-patient has followup appointment 11/03/13 for consideration of further therapies   Procedures:  PRBC TF two units  (i.e. Studies not automatically included, echos, thoracentesis, etc; not x-rays)  Consultations:  Hem/onc  Discharge Exam: Filed Vitals:   10/22/13 0525  BP: 114/63  Pulse: 78  Temp: 98.7 F (37.1 C)  Resp: 18    General: alert Cardiovascular: s1,s2 rrr Respiratory: CTA BL  Discharge Instructions   Future Appointments Provider Department Dept Phone   10/27/2013 10:30 AM [facility] [place] Office [phone]   10/28/2013 3:15 PM Alvan Dame, DPM [facility] [phone]   11/03/2013 11:45 AM [facility] [phone]   11/03/2013 12:15 PM Rana Snare, NP [facility] [phone]   11/03/2013 1:15 PM [facility] [place] [facility] [phone]   11/04/2013 12:45 PM [facility] [place] [facility] [phone]   11/05/2013 1:30 PM [facility] [place] [facility] [phone]   11/06/2013 1:45 PM [facility] Inj Nurse [place] [facility] [phone]   12/31/2013 9:30 AM Laurey Morale, MD [facility] [place] Office [phone]   12/31/2013 9:30 AM [facility] [place] Office [phone]   03/11/2014 1:30 PM Ronney Asters  Mitzi Hansen, MD [place] [facility] RADIATION ONCOLOGY [phone]       Medication List         aspirin EC 81 MG tablet  Take 1 tablet (81 mg total) by mouth daily.     atorvastatin 80 MG tablet  Commonly known as:  LIPITOR  Take 1 tablet by mouth at  bedtime      carvedilol 12.5 MG tablet  Commonly known as:  COREG  2 (two) times daily with a meal.     citalopram 10 MG tablet  Commonly known as:  CELEXA  Take 1 tablet (10 mg total) by mouth daily.     clopidogrel 75 MG tablet  Commonly known as:  PLAVIX  TAKE 1 TABLET BY MOUTH ONCE DAILY     co-enzyme Q-10 30 MG capsule  Take 30 mg by mouth daily.     FLUZONE HIGH-DOSE injection  Generic drug:  influenza vac split trivalent high-dose     furosemide 80 MG tablet  Commonly known as:  LASIX  Take 80 mg by mouth daily.     glucose blood test strip  Commonly known as:  ONE TOUCH ULTRA TEST  Use as directed twice daily dx 250.01     ibuprofen 200 MG tablet  Commonly known as:  ADVIL,MOTRIN  Take 200 mg by mouth every 8 (eight) hours as needed for pain.     insulin lispro 100 UNIT/ML injection  Commonly known as:  HUMALOG  Inject into the skin 3x a day (just before each meal) 09-01-25 units     Insulin Pen Needle 32G X 4 MM Misc  Commonly known as:  BD PEN NEEDLE NANO U/F  Use as directed four times daily     multivitamin tablet  Take 1 tablet by mouth daily.     nitroGLYCERIN 0.4 MG SL tablet  Commonly known as:  NITROSTAT  Place 1 tablet (0.4 mg total) under the tongue every 5 (five) minutes as needed for chest pain.     ramipril 5 MG capsule  Commonly known as:  ALTACE  TAKE 1 CAPSULE BY MOUTH DAILY     spironolactone 25 MG tablet  Commonly known as:  ALDACTONE  1 tablet daily     sucralfate 1 G tablet  Commonly known as:  CARAFATE  Take 1 tablet (1 g total) by mouth 4 (four) times daily.       on No Known Allergies Follow-up Information   Follow up with Romero Belling, MD In 1 week.   Specialty:  Endocrinology   Contact information:   [address] [place] [address] [phone]        The results of significant diagnostics from this hospitalization (including imaging, microbiology, ancillary is and needed at this: laboratory) are listed  below for reference.    Significant Diagnostic Studies: Ct Abdomen Pelvis Wo Contrast  09/25/2013   CLINICAL DATA:  Restaging esophageal carcinoma  EXAM: CT CHEST, ABDOMEN AND PELVIS WITHOUT CONTRAST  TECHNIQUE: Multidetector CT imaging of the chest, abdomen and pelvis was performed following the standard protocol without IV contrast.  COMPARISON:  PET-CT 07/23/2013  FINDINGS: CT CHEST FINDINGS  No significant pleural effusion noted. No airspace consolidation identified. Similar appearance of clustered tree-in-bud nodules within the right middle lobe, image 41/ series 4. No suspicious pulmonary nodule or mass identified. Next  The heart size appears normal. The patient is status post median sternotomy and CABG. There is a left chest wall AICD with  lead in the right ventricle. No mediastinal or hilar adenopathy identified. Interval improvement in concentric distal esophageal mass. This measures up to 1.6 cm, image 50/ series 2. Previously this measured 2.2 cm.  No axillary or supraclavicular adenopathy. 2.3 cm low attenuation nodule is identified within the right lobe of thyroid gland.  Review of the visualized osseous structures is significant for mild multilevel degenerative disc disease within the lumbar spine. No aggressive lytic or sclerotic bone lesions.  CT ABDOMEN AND PELVIS FINDINGS  Stable low attenuation lesion within the dome of liver measuring 8 mm. This is too small to reliably characterize, image 51/ series 2. Lesion within the inferior tip of the liver is again noted measuring 8 mm. This is stable from previous exam and remains too small to characterize. Tiny low-attenuation structure within the inferior right hepatic lobe measures 5 mm. This is too small to characterize. Multiple stones are identified within the gallbladder. No biliary dilatation. Normal appearance of the pancreas. The spleen is unremarkable.  The adrenal glands are both normal. Small bilateral renal calculi are identified. The  urinary bladder appears normal. Prostate gland is enlarged measuring 5 cm, image 122/ series 2.  There is advanced calcified atherosclerotic disease affecting the abdominal aorta. No aneurysm. Gastrohepatic ligament lymph node 1.1 cm, image 57/ series 2. This is compared with 2.4 cm previously. Adjacent gastrohepatic ligament lymph node measures 1.9 cm, image 57/ series 2. Previously 3.7 cm. Resolution of previous left celiac axis lymph node. The para-aortic lymph node has also resolved. There is no free fluid or fluid collections identified within the abdomen or pelvis.  The stomach appears normal. The small bowel loops have a normal course and caliber without evidence for obstruction. Normal appearance of the colon.  Review of the visualized osseous structures is significant for mild multilevel degenerative disc disease. No aggressive lytic or sclerotic bone lesions.  IMPRESSION: CT CHEST IMPRESSION  1. Interval decrease in size of distal esophageal mass. 2. No new or progressive disease identified within the chest.  CT ABDOMEN AND PELVIS IMPRESSION  1. Interval response to therapy. There has been significant decrease in size of metastatic adenopathy within the upper abdomen.  2. No new or progressive disease identified within the abdomen or pelvis.   Electronically Signed   By: Signa Kell M.D.   On: 09/25/2013 16:16   Ct Head Wo Contrast  10/21/2013   CLINICAL DATA:  Head injury.  Personal history of esophageal cancer.  EXAM: CT HEAD WITHOUT CONTRAST  TECHNIQUE: Contiguous axial images were obtained from the base of the skull through the vertex without intravenous contrast.  COMPARISON:  CT head without contrast 10/20/2013.  FINDINGS: No acute cortical infarct, hemorrhage, or mass lesion is present. Mild atrophy and white matter disease is likely within normal limits for age. The ventricles are of normal size. No significant extra-axial fluid collection is present.  No significant extracranial injury is  evident. The paranasal sinuses and mastoid air cells are clear. The osseous skull is intact.  IMPRESSION: 1. Normal CT of the head for age.   Electronically Signed   By: Gennette Pac M.D.   On: 10/21/2013 09:41   Ct Head Wo Contrast  10/20/2013   CLINICAL DATA:  72 year old male with head injury and headache. Patient on Plavix and aspirin.  EXAM: CT HEAD WITHOUT CONTRAST  TECHNIQUE: Contiguous axial images were obtained from the base of the skull through the vertex without intravenous contrast.  COMPARISON:  07/23/2013 PET-CT.  FINDINGS: No  acute intracranial abnormalities are identified, including mass lesion or mass effect, hydrocephalus, extra-axial fluid collection, midline shift, hemorrhage, or acute infarction. The visualized bony calvarium is unremarkable.  IMPRESSION: No evidence of acute intracranial abnormality.   Electronically Signed   By: Laveda Abbe M.D.   On: 10/20/2013 18:15   Ct Chest Wo Contrast  09/25/2013   CLINICAL DATA:  Restaging esophageal carcinoma  EXAM: CT CHEST, ABDOMEN AND PELVIS WITHOUT CONTRAST  TECHNIQUE: Multidetector CT imaging of the chest, abdomen and pelvis was performed following the standard protocol without IV contrast.  COMPARISON:  PET-CT 07/23/2013  FINDINGS: CT CHEST FINDINGS  No significant pleural effusion noted. No airspace consolidation identified. Similar appearance of clustered tree-in-bud nodules within the right middle lobe, image 41/ series 4. No suspicious pulmonary nodule or mass identified. Next  The heart size appears normal. The patient is status post median sternotomy and CABG. There is a left chest wall AICD with lead in the right ventricle. No mediastinal or hilar adenopathy identified. Interval improvement in concentric distal esophageal mass. This measures up to 1.6 cm, image 50/ series 2. Previously this measured 2.2 cm.  No axillary or supraclavicular adenopathy. 2.3 cm low attenuation nodule is identified within the right lobe of thyroid gland.   Review of the visualized osseous structures is significant for mild multilevel degenerative disc disease within the lumbar spine. No aggressive lytic or sclerotic bone lesions.  CT ABDOMEN AND PELVIS FINDINGS  Stable low attenuation lesion within the dome of liver measuring 8 mm. This is too small to reliably characterize, image 51/ series 2. Lesion within the inferior tip of the liver is again noted measuring 8 mm. This is stable from previous exam and remains too small to characterize. Tiny low-attenuation structure within the inferior right hepatic lobe measures 5 mm. This is too small to characterize. Multiple stones are identified within the gallbladder. No biliary dilatation. Normal appearance of the pancreas. The spleen is unremarkable.  The adrenal glands are both normal. Small bilateral renal calculi are identified. The urinary bladder appears normal. Prostate gland is enlarged measuring 5 cm, image 122/ series 2.  There is advanced calcified atherosclerotic disease affecting the abdominal aorta. No aneurysm. Gastrohepatic ligament lymph node 1.1 cm, image 57/ series 2. This is compared with 2.4 cm previously. Adjacent gastrohepatic ligament lymph node measures 1.9 cm, image 57/ series 2. Previously 3.7 cm. Resolution of previous left celiac axis lymph node. The para-aortic lymph node has also resolved. There is no free fluid or fluid collections identified within the abdomen or pelvis.  The stomach appears normal. The small bowel loops have a normal course and caliber without evidence for obstruction. Normal appearance of the colon.  Review of the visualized osseous structures is significant for mild multilevel degenerative disc disease. No aggressive lytic or sclerotic bone lesions.  IMPRESSION: CT CHEST IMPRESSION  1. Interval decrease in size of distal esophageal mass. 2. No new or progressive disease identified within the chest.  CT ABDOMEN AND PELVIS IMPRESSION  1. Interval response to therapy. There  has been significant decrease in size of metastatic adenopathy within the upper abdomen.  2. No new or progressive disease identified within the abdomen or pelvis.   Electronically Signed   By: Signa Kell M.D.   On: 09/25/2013 16:16   Dg Shoulder Left  10/20/2013   CLINICAL DATA:  Fall.  Left shoulder pain.  EXAM: LEFT SHOULDER - 2+ VIEW  COMPARISON:  CT chest 09/25/2013.  FINDINGS: The left shoulder  is located. Degenerative changes are noted at the [facility] joint. The clavicle is intact. No acute bone or soft tissue abnormalities are present. The visualized hemi thorax is clear. The patient is status post median sternotomy for CABG. An AICD is in place.  IMPRESSION: 1. No acute abnormality of the shoulder. 2. AICD. 3. Status post CABG.   Electronically Signed   By: Gennette Pac M.D.   On: 10/20/2013 17:42   Dg Knee Complete 4 Views Left  10/20/2013   CLINICAL DATA:  Fall.  Left knee pain.  EXAM: LEFT KNEE - COMPLETE 4+ VIEW  COMPARISON:  None.  FINDINGS: Mild osteopenia is present. Small vessel atherosclerotic changes are evident. The knee is located. No significant effusion is present. No radiopaque foreign body is evident. Surgical clips are noted along the medial aspect of the proximal tibia.  IMPRESSION: 1. No acute osseous abnormality or significant effusion. 2. Atherosclerotic changes including small vessel disease compatible with the patient's known diagnosis of diabetes. 3. Mild osteopenia.   Electronically Signed   By: Gennette Pac M.D.   On: 10/20/2013 17:43    Microbiology: No results found for this or any previous visit (from the past 240 hour(s)).   Labs: Basic Metabolic Panel:  Recent Labs Lab 10/20/13 1845 10/21/13 0630  NA 136 137  K 4.4 3.9  CL 102 101  CO2 26 26  GLUCOSE 175* 118*  BUN 16 15  CREATININE 1.14 0.98  CALCIUM 8.8 8.5   Liver Function Tests: No results found for this basename: AST, ALT, ALKPHOS, BILITOT, PROT, ALBUMIN,  in the last 168 hours No results  found for this basename: LIPASE, AMYLASE,  in the last 168 hours No results found for this basename: AMMONIA,  in the last 168 hours CBC:  Recent Labs Lab 10/20/13 1110 10/20/13 1845 10/21/13 0630 10/22/13 0524  WBC 3.9* 5.1 4.5 7.5  NEUTROABS 2.6 3.5 3.0  --   HGB 6.7* 6.7* 6.1* 8.4*  HCT 20.7* 19.7* 18.6* 24.4*  MCV 95.8 94.7 96.4 93.8  PLT 33* 35* 35* 37*   Cardiac Enzymes: No results found for this basename: CKTOTAL, CKMB, CKMBINDEX, TROPONINI,  in the last 168 hours BNP: BNP (last 3 results) No results found for this basename: PROBNP,  in the last 8760 hours CBG:  Recent Labs Lab 10/20/13 1819 10/21/13 0635 10/21/13 1122  GLUCAP 167* 125* 176*       Signed:  Jonette Mate N  [facility] 10/21/2013, 2:29 PM     is

## 2013-10-21 NOTE — Progress Notes (Signed)
[facility] PROGRESS NOTE  Billy Bush WUJ:[id number] DOB: 1941/05/15 DOA: 10/20/2013 PCP: Romero Belling, MD   Brief summary  1. 72 y/o male with PMH HTN, HPL, CAD s/p CABG, PTCA, DM, anemia, CHF s/p ICD, and esophageal CA s/p radiation on active chemotherapy presented with mechanical fall, trauma; [place] hitting his head. Patient did not lose consciousness but did not have any chest pain or shortness of breath. He sustained bruises on his forehead left hand and knee. CT head and x-rays do not show anything acute He is found progressive anemia, getting PRBC TF  Assessment/Plan: 1. Fall mechanical; neuro exam non focal; CT head: no acute findings; ambulating well;   2. Progressive anemia, cytopenia likely chemo induced; no s/s of active bleeding; Hg 6.1;  -will TF two units due heart history+hypotension;; recheck Hg in AM  3. CAD s/p CABG, PTCA; holding antiplatelets per hem/onc;  -resume in AM, cont BB, statin;   4. CHF s/p ICD; clinically euvolemic; LVEF 30% (2012) -cont home regimen; cont diuresis PO  5. DM HA1C-6.6 (12/2012) -cont insulin regimen, +ISS  6. Esophageal CA, on chemo; s/p radiation; per he,/onc; who kindly agreed to f/u in AM   Code Status: full Family Communication: son at the bedside (indicate person spoken with, relationship, and if by phone, the number) Disposition Plan: home in 24-48 hours    Consultants:  Hem/onc  Procedures:  Blood TF  Antibiotics:  None  (indicate start date, and stop date if known)  HPI/Subjective: alert  Objective: Filed Vitals:   10/21/13 1003  BP: 95/42  Pulse: 69  Temp: 98.1 F (36.7 C)  Resp: 20    Intake/Output Summary (Last 24 hours) at 10/21/13 1150 Last data filed at 10/21/13 1000  Gross per 24 hour  Intake    360 ml  Output      0 ml  Net    360 ml   There were no vitals filed for this visit.  Exam:   General:  alert  Cardiovascular:  s1,s2 rrr  Respiratory: CTA bl  Abdomen: soft, nt, nd  Musculoskeletal: no LE edema   Data Reviewed: Basic Metabolic Panel:  Recent Labs Lab 10/20/13 1845 10/21/13 0630  NA 136 137  K 4.4 3.9  CL 102 101  CO2 26 26  GLUCOSE 175* 118*  BUN 16 15  CREATININE 1.14 0.98  CALCIUM 8.8 8.5   Liver Function Tests: No results found for this basename: AST, ALT, ALKPHOS, BILITOT, PROT, ALBUMIN,  in the last 168 hours No results found for this basename: LIPASE, AMYLASE,  in the last 168 hours No results found for this basename: AMMONIA,  in the last 168 hours CBC:  Recent Labs Lab 10/20/13 1110 10/20/13 1845 10/21/13 0630  WBC 3.9* 5.1 4.5  NEUTROABS 2.6 3.5 3.0  HGB 6.7* 6.7* 6.1*  HCT 20.7* 19.7* 18.6*  MCV 95.8 94.7 96.4  PLT 33* 35* 35*   Cardiac Enzymes: No results found for this basename: CKTOTAL, CKMB, CKMBINDEX, TROPONINI,  in the last 168 hours BNP (last 3 results) No results found for this basename: PROBNP,  in the last 8760 hours CBG:  Recent Labs Lab 10/20/13 1819 10/21/13 0635 10/21/13 1122  GLUCAP 167* 125* 176*    No results found for this or any previous visit (from the past 240 hour(s)).   Studies: Ct Head Wo Contrast  10/21/2013   CLINICAL DATA:  Head injury.  Personal history  of esophageal cancer.  EXAM: CT HEAD WITHOUT CONTRAST  TECHNIQUE: Contiguous axial images were obtained from the base of the skull through the vertex without intravenous contrast.  COMPARISON:  CT head without contrast 10/20/2013.  FINDINGS: No acute cortical infarct, hemorrhage, or mass lesion is present. Mild atrophy and white matter disease is likely within normal limits for age. The ventricles are of normal size. No significant extra-axial fluid collection is present.  No significant extracranial injury is evident. The paranasal sinuses and mastoid air cells are clear. The osseous skull is intact.  IMPRESSION: 1. Normal CT of the head for age.   Electronically Signed   By:  Gennette Pac M.D.   On: 10/21/2013 09:41   Ct Head Wo Contrast  10/20/2013   CLINICAL DATA:  72 year old male with head injury and headache. Patient on Plavix and aspirin.  EXAM: CT HEAD WITHOUT CONTRAST  TECHNIQUE: Contiguous axial images were obtained from the base of the skull through the vertex without intravenous contrast.  COMPARISON:  07/23/2013 PET-CT.  FINDINGS: No acute intracranial abnormalities are identified, including mass lesion or mass effect, hydrocephalus, extra-axial fluid collection, midline shift, hemorrhage, or acute infarction. The visualized bony calvarium is unremarkable.  IMPRESSION: No evidence of acute intracranial abnormality.   Electronically Signed   By: Laveda Abbe M.D.   On: 10/20/2013 18:15   Dg Shoulder Left  10/20/2013   CLINICAL DATA:  Fall.  Left shoulder pain.  EXAM: LEFT SHOULDER - 2+ VIEW  COMPARISON:  CT chest 09/25/2013.  FINDINGS: The left shoulder is located. Degenerative changes are noted at the [facility] joint. The clavicle is intact. No acute bone or soft tissue abnormalities are present. The visualized hemi thorax is clear. The patient is status post median sternotomy for CABG. An AICD is in place.  IMPRESSION: 1. No acute abnormality of the shoulder. 2. AICD. 3. Status post CABG.   Electronically Signed   By: Gennette Pac M.D.   On: 10/20/2013 17:42   Dg Knee Complete 4 Views Left  10/20/2013   CLINICAL DATA:  Fall.  Left knee pain.  EXAM: LEFT KNEE - COMPLETE 4+ VIEW  COMPARISON:  None.  FINDINGS: Mild osteopenia is present. Small vessel atherosclerotic changes are evident. The knee is located. No significant effusion is present. No radiopaque foreign body is evident. Surgical clips are noted along the medial aspect of the proximal tibia.  IMPRESSION: 1. No acute osseous abnormality or significant effusion. 2. Atherosclerotic changes including small vessel disease compatible with the patient's known diagnosis of diabetes. 3. Mild osteopenia.   Electronically  Signed   By: Gennette Pac M.D.   On: 10/20/2013 17:43    Scheduled Meds: . atorvastatin  80 mg Oral q1800  . carvedilol  12.5 mg Oral BID WC  . citalopram  10 mg Oral Daily  . furosemide  80 mg Oral Daily  . insulin aspart  0-9 Units Subcutaneous TID WC  . multivitamin with minerals  1 tablet Oral Daily  . ramipril  5 mg Oral Daily  . sodium chloride  3 mL Intravenous Q12H  . spironolactone  25 mg Oral Daily  . sucralfate  1 g Oral QID   Continuous Infusions:   Principal Problem:   Head injury Active Problems:   DIABETES MELLITUS, TYPE I   ANEMIA, IRON DEFICIENCY   CORONARY ARTERY DISEASE   SYSTOLIC HEART FAILURE, CHRONIC   ICD-St.Jude   Esophageal carcinoma   Thrombocytopenia    Time spent: >35 minutes  Esperanza Sheets  [facility] Pager [phone]. If 7PM-7AM, please contact night-coverage at [email or web address], password [email or web address] 10/21/2013, 11:50 AM  LOS: 1 day

## 2013-10-21 NOTE — H&P (Signed)
[facility] History and Physical  Billy Bush ZOX:[id number] DOB: 08-06-1941 DOA: 10/20/2013  Referring physician: ER physician. PCP: Romero Belling, MD  Specialists: Dr. Alcide Evener. Oncologist.  Chief Complaint: Fall.  HPI: Billy Bush is a 72 y.o. male with known history of esophageal cancer on chemotherapy [place] hitting his head. Patient did not lose consciousness but did not have any chest pain or shortness of breath. He sustained bruises on his forehead left hand and knee. CT head and x-rays do not show anything acute. Since patient has been on antiplatelet agents and has thrombocytopenia patient has been admitted for observation and repeat CT head to rule out any delayed bleeding. Presently patient is nonfocal.   Review of Systems: As presented in the history of presenting illness, rest negative.  Past Medical History  Diagnosis Date  . Dyslipidemia   . Hypertension   . History of colonic polyps   . Thrombocytopenia   . Nonproliferative diabetic retinopathy NOS(362.03)   . Impotence of organic origin   . Iron deficiency anemia, unspecified   . Unspecified hereditary and idiopathic peripheral neuropathy   . Diabetes mellitus   . Depressive disorder, not elsewhere classified   . Coronary artery disease     a. s/p CABG 1997; b. LHC 04/2007: Distal left main 80% treated with PCI (Cypher DES), LIMA-LAD patent, SVG-OM patent, SVG-PDA patent, EF 30% with inferior HK  . Chronic systolic heart failure     a. Echo 09/2011: EF 30-35%, apical AK, basal inferior AK, mild AI, moderate MR, moderate LAE  . Anxiety state, unspecified   . Ischemic cardiomyopathy   . S/P ICD (internal cardiac defibrillator) procedure   . Esophageal cancer   . Elevated PSA   . History of radiation therapy 08/06/13-09/12/13    esophageal   Past Surgical History  Procedure Laterality Date  . Coronary artery bypass graft      X 3  . Cardiac  catheterization  05/23/07  . Insert / replace / remove pacemaker  07/05/07    [employer] JUDE SINGLE-CHAMBER DEFIBRILLATOR, [name], MD  . Esophagogastroduodenoscopy N/A 07/08/2013    Procedure: ESOPHAGOGASTRODUODENOSCOPY (EGD) with possible Dilation.;  Surgeon: Louis Meckel, MD;  Location: [facility];  Service: Endoscopy;  Laterality: N/A;   Social History:  reports that he quit smoking about 18 years ago. His smoking use included Cigarettes. He smoked 0.00 packs per day for 2 years. He has never used smokeless tobacco. He reports that he does not drink alcohol or use illicit drugs. Where does patient live home. Can patient participate in ADLs? Yes.  No Known Allergies  Family History: History reviewed. No pertinent family history.    Prior to Admission medications   Medication Sig Start Date End Date Taking? Authorizing Provider  aspirin EC 81 MG tablet Take 1 tablet (81 mg total) by mouth daily. 09/29/13  Yes Laurey Morale, MD  atorvastatin (LIPITOR) 80 MG tablet Take 1 tablet by mouth at  bedtime 10/07/13  Yes Romero Belling, MD  carvedilol (COREG) 12.5 MG tablet 2 (two) times daily with a meal.  07/02/13  Yes Beatrice Lecher, PA-C  citalopram (CELEXA) 10 MG tablet Take 1 tablet (10 mg total) by mouth daily. 09/30/13 09/30/14 Yes Romero Belling, MD  clopidogrel (PLAVIX) 75 MG tablet TAKE 1 TABLET BY MOUTH ONCE DAILY 06/13/13  Yes Laurey Morale, MD  co-enzyme Q-10 30 MG capsule Take 30 mg by mouth daily.  Yes Historical Provider, MD  furosemide (LASIX) 80 MG tablet Take 80 mg by mouth daily.  07/02/13  Yes Scott T Alben Spittle, PA-C  ibuprofen (ADVIL,MOTRIN) 200 MG tablet Take 200 mg by mouth every 8 (eight) hours as needed for pain.   Yes Historical Provider, MD  insulin lispro (HUMALOG) 100 UNIT/ML injection Inject into the skin 3x a day (just before each meal) 11-04-27 units 10/20/13  Yes Romero Belling, MD  Multiple Vitamin (MULTIVITAMIN) tablet Take 1 tablet by mouth daily.     Yes Historical  Provider, MD  nitroGLYCERIN (NITROSTAT) 0.4 MG SL tablet Place 1 tablet (0.4 mg total) under the tongue every 5 (five) minutes as needed for chest pain. 05/24/12  Yes Gaylord Shih, MD  ramipril (ALTACE) 5 MG capsule TAKE 1 CAPSULE BY MOUTH DAILY 09/29/13  Yes Laurey Morale, MD  spironolactone (ALDACTONE) 25 MG tablet 1 tablet daily 09/29/13  Yes Laurey Morale, MD  sucralfate (CARAFATE) 1 G tablet Take 1 tablet (1 g total) by mouth 4 (four) times daily. 08/15/13  Yes Jonna Coup, MD  glucose blood (ONE TOUCH ULTRA TEST) test strip Use as directed twice daily dx 250.01 10/07/13   Romero Belling, MD  Insulin Pen Needle (BD PEN NEEDLE NANO U/F) 32G X 4 MM MISC Use as directed four times daily 02/15/12   Romero Belling, MD    Physical Exam: Filed Vitals:   10/20/13 1855 10/20/13 2037 10/20/13 2100 10/20/13 2150  BP: 106/53 109/49 113/52 114/52  Pulse: 72 99 75 77  Temp:  99.1 F (37.3 C)  98.9 F (37.2 C)  TempSrc:  Oral  Oral  Resp: 18 18  20   SpO2: 100% 100% 100% 100%     General:  Well-developed well-nourished.  Eyes: Anicteric no pallor.  ENT: No discharge from ears eyes nose mouth. There is bruise on the left forehead.  Neck:  No mass felt.  Cardiovascular: S1-S2 heard.  Respiratory: No rhonchi or crepitations.  Abdomen: Soft nontender bowel sounds present.  Skin: Bruise on the left forehead left knee and left wrist.  Musculoskeletal: No edema.  Psychiatric: Appears normal.  Neurologic: Alert awake oriented to time place and person. Moves all extremities.  Labs on Admission:  Basic Metabolic Panel:  Recent Labs Lab 10/20/13 1845  NA 136  K 4.4  CL 102  CO2 26  GLUCOSE 175*  BUN 16  CREATININE 1.14  CALCIUM 8.8   Liver Function Tests: No results found for this basename: AST, ALT, ALKPHOS, BILITOT, PROT, ALBUMIN,  in the last 168 hours No results found for this basename: LIPASE, AMYLASE,  in the last 168 hours No results found for this basename: AMMONIA,   in the last 168 hours CBC:  Recent Labs Lab 10/20/13 1110 10/20/13 1845  WBC 3.9* 5.1  NEUTROABS 2.6 3.5  HGB 6.7* 6.7*  HCT 20.7* 19.7*  MCV 95.8 94.7  PLT 33* 35*   Cardiac Enzymes: No results found for this basename: CKTOTAL, CKMB, CKMBINDEX, TROPONINI,  in the last 168 hours  BNP (last 3 results) No results found for this basename: PROBNP,  in the last 8760 hours CBG:  Recent Labs Lab 10/20/13 1819  GLUCAP 167*    Radiological Exams on Admission: Ct Head Wo Contrast  10/20/2013   CLINICAL DATA:  72 year old male with head injury and headache. Patient on Plavix and aspirin.  EXAM: CT HEAD WITHOUT CONTRAST  TECHNIQUE: Contiguous axial images were obtained from the base of the skull through the  vertex without intravenous contrast.  COMPARISON:  07/23/2013 PET-CT.  FINDINGS: No acute intracranial abnormalities are identified, including mass lesion or mass effect, hydrocephalus, extra-axial fluid collection, midline shift, hemorrhage, or acute infarction. The visualized bony calvarium is unremarkable.  IMPRESSION: No evidence of acute intracranial abnormality.   Electronically Signed   By: Laveda Abbe M.D.   On: 10/20/2013 18:15   Dg Shoulder Left  10/20/2013   CLINICAL DATA:  Fall.  Left shoulder pain.  EXAM: LEFT SHOULDER - 2+ VIEW  COMPARISON:  CT chest 09/25/2013.  FINDINGS: The left shoulder is located. Degenerative changes are noted at the [facility] joint. The clavicle is intact. No acute bone or soft tissue abnormalities are present. The visualized hemi thorax is clear. The patient is status post median sternotomy for CABG. An AICD is in place.  IMPRESSION: 1. No acute abnormality of the shoulder. 2. AICD. 3. Status post CABG.   Electronically Signed   By: Gennette Pac M.D.   On: 10/20/2013 17:42   Dg Knee Complete 4 Views Left  10/20/2013   CLINICAL DATA:  Fall.  Left knee pain.  EXAM: LEFT KNEE - COMPLETE 4+ VIEW  COMPARISON:  None.  FINDINGS: Mild osteopenia is present. Small  vessel atherosclerotic changes are evident. The knee is located. No significant effusion is present. No radiopaque foreign body is evident. Surgical clips are noted along the medial aspect of the proximal tibia.  IMPRESSION: 1. No acute osseous abnormality or significant effusion. 2. Atherosclerotic changes including small vessel disease compatible with the patient's known diagnosis of diabetes. 3. Mild osteopenia.   Electronically Signed   By: Gennette Pac M.D.   On: 10/20/2013 17:43     Assessment/Plan Principal Problem:   Head injury Active Problems:   DIABETES MELLITUS, TYPE I   ANEMIA, IRON DEFICIENCY   CORONARY ARTERY DISEASE   SYSTOLIC HEART FAILURE, CHRONIC   ICD-St.Jude   Esophageal carcinoma   Thrombocytopenia   1. Head injury status post mechanical fall - presently patient is nonfocal. Did discuss with on-call neurologist Dr. Thad Ranger who has advised to get a repeat CT head in a.m. to rule out any delayed bleeding. 2. Anemia and thrombocytopenia - as per patient's oncologist patient was advised to stop taking patient's antiplatelet agents. Closely follow CBC. As per patient's oncologist if there is any further drop in hemoglobin may need transfusion. 3. CAD status post CABG in the AICD placement - continue diuretics. Aspirin and Plavix on hold due to #2. 4. Esophageal cancer - per oncologist. 5. Diabetes mellitus type 2 - continue home medications.    Code Status: Full code.  Family Communication: Patient's son at the bedside.  Disposition Plan: Admit for observation.    Rikayla Demmon N. [facility] Pager [phone].  If 7PM-7AM, please contact night-coverage [email or web address] Password [facility] 10/21/2013, 12:24 AM

## 2013-10-22 ENCOUNTER — Other Ambulatory Visit: Payer: Medicare Other | Admitting: Lab

## 2013-10-22 LAB — TYPE AND SCREEN
ABO/RH(D): A POS
Antibody Screen: NEGATIVE
Unit division: 0

## 2013-10-22 LAB — CBC
Hemoglobin: 8.4 g/dL — ABNORMAL LOW (ref 13.0–17.0)
MCHC: 34.4 g/dL (ref 30.0–36.0)
RBC: 2.6 MIL/uL — ABNORMAL LOW (ref 4.22–5.81)
RDW: 20.1 % — ABNORMAL HIGH (ref 11.5–15.5)
WBC: 7.5 10*3/uL (ref 4.0–10.5)

## 2013-10-22 LAB — GLUCOSE, CAPILLARY: Glucose-Capillary: 148 mg/dL — ABNORMAL HIGH (ref 70–99)

## 2013-10-22 NOTE — Progress Notes (Signed)
Pt. DC'd home via car with son.  DC instructions given and understood.  Assessments were stable.

## 2013-10-27 ENCOUNTER — Other Ambulatory Visit (HOSPITAL_BASED_OUTPATIENT_CLINIC_OR_DEPARTMENT_OTHER): Payer: Medicare Other

## 2013-10-27 ENCOUNTER — Ambulatory Visit (INDEPENDENT_AMBULATORY_CARE_PROVIDER_SITE_OTHER): Payer: Medicare Other | Admitting: *Deleted

## 2013-10-27 ENCOUNTER — Telehealth: Payer: Self-pay | Admitting: *Deleted

## 2013-10-27 ENCOUNTER — Other Ambulatory Visit: Payer: Self-pay | Admitting: *Deleted

## 2013-10-27 ENCOUNTER — Other Ambulatory Visit (INDEPENDENT_AMBULATORY_CARE_PROVIDER_SITE_OTHER): Payer: Medicare Other

## 2013-10-27 DIAGNOSIS — D649 Anemia, unspecified: Secondary | ICD-10-CM

## 2013-10-27 DIAGNOSIS — I5022 Chronic systolic (congestive) heart failure: Secondary | ICD-10-CM

## 2013-10-27 DIAGNOSIS — C7A1 Malignant poorly differentiated neuroendocrine tumors: Secondary | ICD-10-CM

## 2013-10-27 DIAGNOSIS — E109 Type 1 diabetes mellitus without complications: Secondary | ICD-10-CM

## 2013-10-27 DIAGNOSIS — C7B8 Other secondary neuroendocrine tumors: Secondary | ICD-10-CM

## 2013-10-27 LAB — MDC_IDC_ENUM_SESS_TYPE_INCLINIC
Battery Remaining Longevity: 51.6 mo
Battery Voltage: 2.57 V
Date Time Interrogation Session: 20141201110747
HighPow Impedance: 32.2197
Lead Channel Impedance Value: 287.5 Ohm
Lead Channel Pacing Threshold Pulse Width: 0.5 ms
Lead Channel Setting Sensing Sensitivity: 0.3 mV
Zone Setting Detection Interval: 250 ms
Zone Setting Detection Interval: 300 ms
Zone Setting Detection Interval: 340 ms

## 2013-10-27 LAB — CBC WITH DIFFERENTIAL/PLATELET
BASO%: 0.3 % (ref 0.0–2.0)
EOS%: 0.4 % (ref 0.0–7.0)
MCH: 31.9 pg (ref 27.2–33.4)
MCHC: 32.7 g/dL (ref 32.0–36.0)
MONO#: 1.1 10*3/uL — ABNORMAL HIGH (ref 0.1–0.9)
RBC: 2.76 10*6/uL — ABNORMAL LOW (ref 4.20–5.82)
RDW: 22.3 % — ABNORMAL HIGH (ref 11.0–14.6)
WBC: 10.8 10*3/uL — ABNORMAL HIGH (ref 4.0–10.3)
lymph#: 1 10*3/uL (ref 0.9–3.3)

## 2013-10-27 LAB — URINALYSIS
Ketones, ur: NEGATIVE
Leukocytes, UA: NEGATIVE
Nitrite: NEGATIVE
Specific Gravity, Urine: 1.01 (ref 1.000–1.030)
pH: 7 (ref 5.0–8.0)

## 2013-10-27 LAB — LIPID PANEL
Cholesterol: 129 mg/dL (ref 0–200)
HDL: 44 mg/dL (ref >39.00)
VLDL: 13.6 mg/dL (ref 0.0–40.0)

## 2013-10-27 LAB — COMPREHENSIVE METABOLIC PANEL
ALT: 26 U/L (ref 0–53)
Alkaline Phosphatase: 83 U/L (ref 39–117)
BUN: 16 mg/dL (ref 6–23)
CO2: 29 mEq/L (ref 19–32)
Calcium: 8.9 mg/dL (ref 8.4–10.5)
Chloride: 101 mEq/L (ref 96–112)
Creatinine, Ser: 0.9 mg/dL (ref 0.4–1.5)
GFR: 85.35 mL/min (ref >60.00)
Total Bilirubin: 0.8 mg/dL (ref 0.3–1.2)

## 2013-10-27 LAB — MICROALBUMIN / CREATININE URINE RATIO
Creatinine,U: 22.8 mg/dL
Microalb Creat Ratio: 4.4 mg/g (ref 0.0–30.0)

## 2013-10-27 LAB — HOLD TUBE, BLOOD BANK

## 2013-10-27 NOTE — Progress Notes (Signed)
Histogram distribution appropriate for patient and level of activity. No changes made this session. Device programmed at appropriate safety margins. Device programmed to optimize intrinsic conduction.  Estimated longevity 4.2 years.  Patient education completed including shock plan. Alert tones/vibration demonstrated for patient.  ROV in February with the device clinic.

## 2013-10-27 NOTE — Telephone Encounter (Signed)
Call from pt's wife requesting lab results. Asking if OK for pt to have dental cleaning 12/2? Usually has to pre-medicate due to cardiac issues. Reviewed with Dr. Truett Perna: OK for dental cleaning. Mrs. Oquinn made aware.

## 2013-10-28 ENCOUNTER — Ambulatory Visit (INDEPENDENT_AMBULATORY_CARE_PROVIDER_SITE_OTHER): Payer: Medicare Other

## 2013-10-28 DIAGNOSIS — L608 Other nail disorders: Secondary | ICD-10-CM

## 2013-10-29 ENCOUNTER — Other Ambulatory Visit: Payer: Self-pay

## 2013-10-29 ENCOUNTER — Ambulatory Visit (INDEPENDENT_AMBULATORY_CARE_PROVIDER_SITE_OTHER): Payer: Medicare Other | Admitting: Endocrinology

## 2013-10-29 ENCOUNTER — Encounter: Payer: Self-pay | Admitting: Endocrinology

## 2013-10-29 VITALS — BP 110/64 | HR 58 | Temp 97.5°F | Ht 67.0 in | Wt 164.4 lb

## 2013-10-29 DIAGNOSIS — E1039 Type 1 diabetes mellitus with other diabetic ophthalmic complication: Secondary | ICD-10-CM

## 2013-10-29 DIAGNOSIS — E109 Type 1 diabetes mellitus without complications: Secondary | ICD-10-CM

## 2013-10-29 DIAGNOSIS — D509 Iron deficiency anemia, unspecified: Secondary | ICD-10-CM

## 2013-10-29 DIAGNOSIS — I5022 Chronic systolic (congestive) heart failure: Secondary | ICD-10-CM

## 2013-10-29 LAB — IBC PANEL
Iron: 71 ug/dL (ref 42–165)
Saturation Ratios: 26.5 % (ref 20.0–50.0)
Transferrin: 191.5 mg/dL — ABNORMAL LOW (ref 212.0–360.0)

## 2013-10-29 LAB — HEMOGLOBIN A1C: Hgb A1c MFr Bld: 6.2 % (ref 4.6–6.5)

## 2013-10-29 MED ORDER — INSULIN LISPRO 100 UNIT/ML ~~LOC~~ SOLN: SUBCUTANEOUS | Status: DC

## 2013-10-29 NOTE — Patient Instructions (Addendum)
blood tests are being requested for you today.  We'll contact you with results. Please come back for a follow-up appointment in 3 months. check your blood sugar twice a day.  vary the time of day when you check, between before the 3 meals, and at bedtime.  also check if you have symptoms of your blood sugar being too high or too low.  please keep a record of the readings and bring it to your next appointment here.  You can write it on any piece of paper.  please call us sooner if your blood sugar goes below 70, or if you have a lot of readings over 200.  Please stop taking the ramipril.

## 2013-10-29 NOTE — Progress Notes (Signed)
Subjective:    Patient ID: Billy Bush, male    DOB: [birth year], 72 y.o.   MRN: [id number]  HPI Pt returns for f/u of IDDM (dx'ed 1985, he has moderate neuropathy of the lower extremities, and associated CAD and retinopathy; he has been on insulin since approx 1990).  no cbg record, but states cbg's vary from 80-220.  He averages missing 1 insulin injection per day.   He was recently dx'ed with esophageal cancer. He was recently seen in the hospital after a fall.  Denies dizziness. He was noted in the hospital to have severe anemia. Past Medical History  Diagnosis Date  . Dyslipidemia   . Hypertension   . History of colonic polyps   . Thrombocytopenia   . Nonproliferative diabetic retinopathy NOS(362.03)   . Impotence of organic origin   . Iron deficiency anemia, unspecified   . Unspecified hereditary and idiopathic peripheral neuropathy   . Diabetes mellitus   . Depressive disorder, not elsewhere classified   . Coronary artery disease     a. s/p CABG 1997; b. LHC 04/2007: Distal left main 80% treated with PCI (Cypher DES), LIMA-LAD patent, SVG-OM patent, SVG-PDA patent, EF 30% with inferior HK  . Chronic systolic heart failure     a. Echo 09/2011: EF 30-35%, apical AK, basal inferior AK, mild AI, moderate MR, moderate LAE  . Anxiety state, unspecified   . Ischemic cardiomyopathy   . S/P ICD (internal cardiac defibrillator) procedure   . Esophageal cancer   . Elevated PSA   . History of radiation therapy 08/06/13-09/12/13    esophageal    Past Surgical History  Procedure Laterality Date  . Coronary artery bypass graft      X 3  . Cardiac catheterization  05/23/07  . Insert / replace / remove pacemaker  07/05/07    [employer] JUDE SINGLE-CHAMBER DEFIBRILLATOR, [name], MD  . Esophagogastroduodenoscopy N/A 07/08/2013    Procedure: ESOPHAGOGASTRODUODENOSCOPY (EGD) with possible Dilation.;  Surgeon: Louis Meckel, MD;  Location: [facility];  Service: Endoscopy;  Laterality:  N/A;    History   Social History  . Marital Status: Married    Spouse Name: N/A    Number of Children: 4  . Years of Education: N/A   Occupational History  . PASTOR    Social History Main Topics  . Smoking status: Former Smoker -- 2 years    Types: Cigarettes    Quit date: 11/27/1994  . Smokeless tobacco: Never Used  . Alcohol Use: No  . Drug Use: No  . Sexual Activity: Not on file   Other Topics Concern  . Not on file   Social History Narrative   [employer]   MARRIED   4 BOYS   FORMER SMOKER, QUIT 14 YRS   ALCOHOL USE -NO   NO ILLICIT DRUG USE         ICD-St. JUDE      PATIENT SIGNED A DESIGNATED PARTY RELEASE TO ALLOW HIS WIFE, GLENDA Joslin, TO HAVE ACCESS TO HIS MEDICAL RECORDS/INFORMATION.    [name], March 21, 2010 9:42 AM    Current Outpatient Prescriptions on File Prior to Visit  Medication Sig Dispense Refill  . aspirin EC 81 MG tablet Take 1 tablet (81 mg total) by mouth daily.      Marland Kitchen atorvastatin (LIPITOR) 80 MG tablet Take 1 tablet by mouth at  bedtime  90 tablet  0  . carvedilol (COREG) 12.5 MG tablet 2 (two) times daily with  a meal.       . citalopram (CELEXA) 10 MG tablet Take 1 tablet (10 mg total) by mouth daily.  90 tablet  0  . clopidogrel (PLAVIX) 75 MG tablet TAKE 1 TABLET BY MOUTH ONCE DAILY  90 tablet  2  . co-enzyme Q-10 30 MG capsule Take 30 mg by mouth daily.        Marland Kitchen FLUZONE HIGH-DOSE injection       . furosemide (LASIX) 80 MG tablet Take 80 mg by mouth daily.       Marland Kitchen glucose blood (ONE TOUCH ULTRA TEST) test strip Use as directed twice daily dx 250.01  200 each  3  . ibuprofen (ADVIL,MOTRIN) 200 MG tablet Take 200 mg by mouth every 8 (eight) hours as needed for pain.      . Insulin Pen Needle (BD PEN NEEDLE NANO U/F) 32G X 4 MM MISC Use as directed four times daily  400 each  3  . Multiple Vitamin (MULTIVITAMIN) tablet Take 1 tablet by mouth daily.        . nitroGLYCERIN (NITROSTAT) 0.4 MG SL tablet Place 1 tablet (0.4 mg  total) under the tongue every 5 (five) minutes as needed for chest pain.  25 tablet  3  . spironolactone (ALDACTONE) 25 MG tablet 1 tablet daily  90 tablet  1  . sucralfate (CARAFATE) 1 G tablet Take 1 tablet (1 g total) by mouth 4 (four) times daily.  120 tablet  2   No current facility-administered medications on file prior to visit.   No Known Allergies  No family history on file.  BP 110/64  Pulse 58  Temp(Src) 97.5 F (36.4 C) (Oral)  Ht 5\' 7"  (1.702 m)  Wt 164 lb 7 oz (74.588 kg)  BMI 25.75 kg/m2  SpO2 99%  Review of Systems He has lost weight.  He reports a tickle sensation in the throat    Objective:   Physical Exam VITAL SIGNS:  See vs page GENERAL: no distress  Lab Results  Component Value Date   HGBA1C 6.2 10/29/2013   Iron sat is normal    Assessment & Plan:  Weight loss, due to his cancer. Anemia: fe is normal.   DM: overcontrolled, due to weight loss Noncompliance with insulin injections: He declines changing to a bid insulin injection. Sensation in the throat, prob due to acei.  With this bp, he can tolerate being off it. CHF: although he is stopping acei, he may not need it in view of his weight loss

## 2013-10-31 ENCOUNTER — Telehealth: Payer: Self-pay | Admitting: *Deleted

## 2013-10-31 NOTE — Telephone Encounter (Signed)
Call from pt's wife asking if he should resume Plavix and Aspirin? Reviewed with Dr. Truett Perna-- YES. She voiced understanding.

## 2013-11-01 ENCOUNTER — Other Ambulatory Visit: Payer: Self-pay | Admitting: Oncology

## 2013-11-03 ENCOUNTER — Ambulatory Visit (HOSPITAL_BASED_OUTPATIENT_CLINIC_OR_DEPARTMENT_OTHER): Payer: Medicare Other | Admitting: Nurse Practitioner

## 2013-11-03 ENCOUNTER — Telehealth: Payer: Self-pay | Admitting: Oncology

## 2013-11-03 ENCOUNTER — Other Ambulatory Visit (HOSPITAL_BASED_OUTPATIENT_CLINIC_OR_DEPARTMENT_OTHER): Payer: Medicare Other | Admitting: Lab

## 2013-11-03 ENCOUNTER — Ambulatory Visit (HOSPITAL_BASED_OUTPATIENT_CLINIC_OR_DEPARTMENT_OTHER): Payer: Medicare Other

## 2013-11-03 VITALS — BP 105/53 | HR 62 | Temp 97.0°F | Resp 18 | Ht 67.0 in | Wt 162.8 lb

## 2013-11-03 DIAGNOSIS — C159 Malignant neoplasm of esophagus, unspecified: Secondary | ICD-10-CM

## 2013-11-03 DIAGNOSIS — C7B8 Other secondary neuroendocrine tumors: Secondary | ICD-10-CM

## 2013-11-03 DIAGNOSIS — C155 Malignant neoplasm of lower third of esophagus: Secondary | ICD-10-CM

## 2013-11-03 DIAGNOSIS — D6481 Anemia due to antineoplastic chemotherapy: Secondary | ICD-10-CM

## 2013-11-03 DIAGNOSIS — R1319 Other dysphagia: Secondary | ICD-10-CM

## 2013-11-03 DIAGNOSIS — C7A1 Malignant poorly differentiated neuroendocrine tumors: Secondary | ICD-10-CM

## 2013-11-03 DIAGNOSIS — Z5111 Encounter for antineoplastic chemotherapy: Secondary | ICD-10-CM

## 2013-11-03 DIAGNOSIS — D649 Anemia, unspecified: Secondary | ICD-10-CM

## 2013-11-03 DIAGNOSIS — D6959 Other secondary thrombocytopenia: Secondary | ICD-10-CM

## 2013-11-03 DIAGNOSIS — E119 Type 2 diabetes mellitus without complications: Secondary | ICD-10-CM

## 2013-11-03 LAB — COMPREHENSIVE METABOLIC PANEL (CC13)
ALT: 26 U/L (ref 0–55)
AST: 28 U/L (ref 5–34)
Albumin: 3.6 g/dL (ref 3.5–5.0)
Alkaline Phosphatase: 110 U/L (ref 40–150)
Anion Gap: 9 mEq/L (ref 3–11)
BUN: 15.5 mg/dL (ref 7.0–26.0)
CO2: 26 mEq/L (ref 22–29)
Calcium: 9.1 mg/dL (ref 8.4–10.4)
Chloride: 101 mEq/L (ref 98–109)
Potassium: 4.3 mEq/L (ref 3.5–5.1)
Sodium: 136 mEq/L (ref 136–145)
Total Protein: 6.8 g/dL (ref 6.4–8.3)

## 2013-11-03 LAB — CBC WITH DIFFERENTIAL/PLATELET
BASO%: 0.4 % (ref 0.0–2.0)
Eosinophils Absolute: 0.1 10*3/uL (ref 0.0–0.5)
HGB: 8.9 g/dL — ABNORMAL LOW (ref 13.0–17.1)
LYMPH%: 12.2 % — ABNORMAL LOW (ref 14.0–49.0)
MCH: 31.7 pg (ref 27.2–33.4)
MCHC: 32.7 g/dL (ref 32.0–36.0)
MONO#: 1 10*3/uL — ABNORMAL HIGH (ref 0.1–0.9)
NEUT#: 7 10*3/uL — ABNORMAL HIGH (ref 1.5–6.5)
NEUT%: 76.1 % — ABNORMAL HIGH (ref 39.0–75.0)
Platelets: 113 10*3/uL — ABNORMAL LOW (ref 140–400)
RBC: 2.81 10*6/uL — ABNORMAL LOW (ref 4.20–5.82)
RDW: 20.3 % — ABNORMAL HIGH (ref 11.0–14.6)
WBC: 9.2 10*3/uL (ref 4.0–10.3)
lymph#: 1.1 10*3/uL (ref 0.9–3.3)
nRBC: 0 % (ref 0–0)

## 2013-11-03 MED ORDER — SODIUM CHLORIDE 0.9 % IV SOLN
Freq: Once | INTRAVENOUS | Status: AC
  Administered 2013-11-03: 14:00:00 via INTRAVENOUS

## 2013-11-03 MED ORDER — ONDANSETRON 16 MG/50ML IVPB (CHCC)
INTRAVENOUS | Status: AC
  Filled 2013-11-03: qty 16

## 2013-11-03 MED ORDER — ONDANSETRON 16 MG/50ML IVPB (CHCC)
16.0000 mg | Freq: Once | INTRAVENOUS | Status: AC
  Administered 2013-11-03: 16 mg via INTRAVENOUS

## 2013-11-03 MED ORDER — DEXAMETHASONE SODIUM PHOSPHATE 20 MG/5ML IJ SOLN
20.0000 mg | Freq: Once | INTRAMUSCULAR | Status: AC
  Administered 2013-11-03: 20 mg via INTRAVENOUS

## 2013-11-03 MED ORDER — SODIUM CHLORIDE 0.9 % IV SOLN
284.7000 mg | Freq: Once | INTRAVENOUS | Status: AC
  Administered 2013-11-03: 280 mg via INTRAVENOUS
  Filled 2013-11-03: qty 28

## 2013-11-03 MED ORDER — SODIUM CHLORIDE 0.9 % IV SOLN
65.0000 mg/m2 | Freq: Once | INTRAVENOUS | Status: AC
  Administered 2013-11-03: 120 mg via INTRAVENOUS
  Filled 2013-11-03: qty 6

## 2013-11-03 MED ORDER — DEXAMETHASONE SODIUM PHOSPHATE 20 MG/5ML IJ SOLN
INTRAMUSCULAR | Status: AC
  Filled 2013-11-03: qty 5

## 2013-11-03 NOTE — Progress Notes (Signed)
OFFICE PROGRESS NOTE  Interval history:  Billy Bush returns for followup of esophagus cancer. He completed cycle 1 carboplatin/etoposide beginning 10/06/2013. He received Neulasta support. He developed hematologic toxicity.  He was hospitalized 10/20/2013 through 10/21/2013 following a fall. Initial brain CT showed no evidence of acute intracranial abnormality. Followup brain CT on 10/21/2013 was read as normal for age. He received red cell transfusion support.  He is feeling better. No further falls. Energy level has improved. He denies any bleeding. No dysphagia. He denies headaches and vision change. No nausea or vomiting. No mouth sores. No diarrhea. He continues to have a good appetite.   Objective: Blood pressure 105/53, pulse 62, temperature 97 F (36.1 C), temperature source Oral, resp. rate 18, height 5\' 7"  (1.702 m), weight 162 lb 12.8 oz (73.846 kg).  Oropharynx is without thrush or ulceration. Resolving ecchymosis left side of face. Lungs are clear. Regular cardiac rhythm. Abdomen soft and nontender. No organomegaly. Extremities without edema.  Lab Results: Lab Results  Component Value Date   WBC 9.2 11/03/2013   HGB 8.9* 11/03/2013   HCT 27.2* 11/03/2013   MCV 96.8 11/03/2013   PLT 113* 11/03/2013    Chemistry:    Chemistry      Component Value Date/Time   NA 137 10/27/2013 1116   NA 133* 10/06/2013 1149   K 4.4 10/27/2013 1116   K 4.3 10/06/2013 1149   CL 101 10/27/2013 1116   CO2 29 10/27/2013 1116   CO2 23 10/06/2013 1149   BUN 16 10/27/2013 1116   BUN 17.7 10/06/2013 1149   CREATININE 0.9 10/27/2013 1116   CREATININE 0.9 10/06/2013 1149      Component Value Date/Time   CALCIUM 8.9 10/27/2013 1116   CALCIUM 8.7 10/06/2013 1149   ALKPHOS 83 10/27/2013 1116   ALKPHOS 81 10/06/2013 1149   AST 32 10/27/2013 1116   AST 21 10/06/2013 1149   ALT 26 10/27/2013 1116   ALT 17 10/06/2013 1149   BILITOT 0.8 10/27/2013 1116   BILITOT 0.70 10/06/2013 1149        Studies/Results: Ct Head Wo Contrast  10/21/2013   CLINICAL DATA:  Head injury.  Personal history of esophageal cancer.  EXAM: CT HEAD WITHOUT CONTRAST  TECHNIQUE: Contiguous axial images were obtained from the base of the skull through the vertex without intravenous contrast.  COMPARISON:  CT head without contrast 10/20/2013.  FINDINGS: No acute cortical infarct, hemorrhage, or mass lesion is present. Mild atrophy and white matter disease is likely within normal limits for age. The ventricles are of normal size. No significant extra-axial fluid collection is present.  No significant extracranial injury is evident. The paranasal sinuses and mastoid air cells are clear. The osseous skull is intact.  IMPRESSION: 1. Normal CT of the head for age.   Electronically Signed   By: Gennette Pac M.D.   On: 10/21/2013 09:41   Ct Head Wo Contrast  10/20/2013   CLINICAL DATA:  72 year old male with head injury and headache. Patient on Plavix and aspirin.  EXAM: CT HEAD WITHOUT CONTRAST  TECHNIQUE: Contiguous axial images were obtained from the base of the skull through the vertex without intravenous contrast.  COMPARISON:  07/23/2013 PET-CT.  FINDINGS: No acute intracranial abnormalities are identified, including mass lesion or mass effect, hydrocephalus, extra-axial fluid collection, midline shift, hemorrhage, or acute infarction. The visualized bony calvarium is unremarkable.  IMPRESSION: No evidence of acute intracranial abnormality.   Electronically Signed   By: Dolores Frame.D.  On: 10/20/2013 18:15   Dg Shoulder Left  10/20/2013   CLINICAL DATA:  Fall.  Left shoulder pain.  EXAM: LEFT SHOULDER - 2+ VIEW  COMPARISON:  CT chest 09/25/2013.  FINDINGS: The left shoulder is located. Degenerative changes are noted at the [facility] joint. The clavicle is intact. No acute bone or soft tissue abnormalities are present. The visualized hemi thorax is clear. The patient is status post median sternotomy for CABG. An AICD is  in place.  IMPRESSION: 1. No acute abnormality of the shoulder. 2. AICD. 3. Status post CABG.   Electronically Signed   By: Gennette Pac M.D.   On: 10/20/2013 17:42   Dg Knee Complete 4 Views Left  10/20/2013   CLINICAL DATA:  Fall.  Left knee pain.  EXAM: LEFT KNEE - COMPLETE 4+ VIEW  COMPARISON:  None.  FINDINGS: Mild osteopenia is present. Small vessel atherosclerotic changes are evident. The knee is located. No significant effusion is present. No radiopaque foreign body is evident. Surgical clips are noted along the medial aspect of the proximal tibia.  IMPRESSION: 1. No acute osseous abnormality or significant effusion. 2. Atherosclerotic changes including small vessel disease compatible with the patient's known diagnosis of diabetes. 3. Mild osteopenia.   Electronically Signed   By: Gennette Pac M.D.   On: 10/20/2013 17:43    Medications: I have reviewed the patient's current medications.  Assessment/Plan:  1. High-grade neuroendocrine carcinoma of the esophagus, clinical stage III. Staging PET scan 07/23/2013 showed a large distal esophageal mass with neoplastic range FDG uptake, metastatic upper abdominal lymphadenopathy, indeterminate small pulmonary nodules. -initiation of radiation on 08/06/2013. Radiation completed 09/12/2013.  -Initiation of weekly carboplatin 08/07/2013. Final weekly carboplatin 08/28/2013.  -Restaging CT of the chest, abdomen, and pelvis on 09/25/2013: Decreased esophagus mass, decreased upper abdomen adenopathy. No new or progressive disease per  -Cycle 1 etoposide/carboplatin 10/06/2013.  2. Dysphagia secondary to #1. Improved. 3. History of coronary artery disease. 4. Congestive heart failure. 5. Diabetes. 6. History of Weight loss. Weight is stable. 7. Mild hypotension.  8. Anemia secondary to malnutrition and chemotherapy, progressive 9. Thrombocytopenia secondary to chemotherapy. 10. Hospitalization 10/20/2013 following a fall.  Disposition-he  appears stable. Plan to proceed with cycle 2 carboplatin/etoposide today as scheduled with dose reductions of both due to hematologic toxicity following cycle 1. He will return for a nadir CBC on 11/13/2013. We will see him prior to cycle 3 on 12/02/2013. He will contact the office in the interim with any problems.  Plan reviewed with Dr. Truett Perna.  Lonna Cobb ANP/GNP-BC

## 2013-11-03 NOTE — Patient Instructions (Signed)
[facility] Discharge Instructions for Patients Receiving Chemotherapy  Today you received the following chemotherapy agents Carboplatin, VP-16. . To help prevent nausea and vomiting after your treatment, we encourage you to take your nausea medication as ordered by Dr. Truett Perna.   If you develop nausea and vomiting that is not controlled by your nausea medication, call the clinic.   BELOW ARE SYMPTOMS THAT SHOULD BE REPORTED IMMEDIATELY:  *FEVER GREATER THAN 100.5 F  *CHILLS WITH OR WITHOUT FEVER  NAUSEA AND VOMITING THAT IS NOT CONTROLLED WITH YOUR NAUSEA MEDICATION  *UNUSUAL SHORTNESS OF BREATH  *UNUSUAL BRUISING OR BLEEDING  TENDERNESS IN MOUTH AND THROAT WITH OR WITHOUT PRESENCE OF ULCERS  *URINARY PROBLEMS  *BOWEL PROBLEMS  UNUSUAL RASH Items with * indicate a potential emergency and should be followed up as soon as possible.  Feel free to call the clinic you have any questions or concerns. The clinic phone number is [phone].

## 2013-11-03 NOTE — Telephone Encounter (Signed)
gv and printed appt sched and avs for pt for DEc and J;an 2015 .Marland Kitchen..sed added tx.

## 2013-11-03 NOTE — Progress Notes (Signed)
Discharged at 1700, ambulatory in no distress with grandson by his side.  Asked when he should take a claratin for the injection.  No further questions.

## 2013-11-04 ENCOUNTER — Ambulatory Visit (HOSPITAL_BASED_OUTPATIENT_CLINIC_OR_DEPARTMENT_OTHER): Payer: Medicare Other

## 2013-11-04 VITALS — BP 113/54 | HR 62 | Temp 97.9°F | Resp 18

## 2013-11-04 DIAGNOSIS — C155 Malignant neoplasm of lower third of esophagus: Secondary | ICD-10-CM

## 2013-11-04 DIAGNOSIS — C7B8 Other secondary neuroendocrine tumors: Secondary | ICD-10-CM

## 2013-11-04 DIAGNOSIS — Z5111 Encounter for antineoplastic chemotherapy: Secondary | ICD-10-CM

## 2013-11-04 DIAGNOSIS — C7A1 Malignant poorly differentiated neuroendocrine tumors: Secondary | ICD-10-CM

## 2013-11-04 DIAGNOSIS — C159 Malignant neoplasm of esophagus, unspecified: Secondary | ICD-10-CM

## 2013-11-04 MED ORDER — SODIUM CHLORIDE 0.9 % IV SOLN
Freq: Once | INTRAVENOUS | Status: AC
  Administered 2013-11-04: 13:00:00 via INTRAVENOUS

## 2013-11-04 MED ORDER — SODIUM CHLORIDE 0.9 % IV SOLN
65.0000 mg/m2 | Freq: Once | INTRAVENOUS | Status: AC
  Administered 2013-11-04: 120 mg via INTRAVENOUS
  Filled 2013-11-04: qty 6

## 2013-11-04 MED ORDER — ONDANSETRON 8 MG/50ML IVPB (CHCC)
8.0000 mg | Freq: Once | INTRAVENOUS | Status: AC
  Administered 2013-11-04: 8 mg via INTRAVENOUS

## 2013-11-04 MED ORDER — ONDANSETRON 8 MG/NS 50 ML IVPB
INTRAVENOUS | Status: AC
  Filled 2013-11-04: qty 8

## 2013-11-04 NOTE — Patient Instructions (Signed)
[facility] Discharge Instructions for Patients Receiving Chemotherapy  Today you received the following chemotherapy agent Etoposide (VP-16).  To help prevent nausea and vomiting after your treatment, we encourage you to take your nausea medication.   If you develop nausea and vomiting that is not controlled by your nausea medication, call the clinic.   BELOW ARE SYMPTOMS THAT SHOULD BE REPORTED IMMEDIATELY:  *FEVER GREATER THAN 100.5 F  *CHILLS WITH OR WITHOUT FEVER  NAUSEA AND VOMITING THAT IS NOT CONTROLLED WITH YOUR NAUSEA MEDICATION  *UNUSUAL SHORTNESS OF BREATH  *UNUSUAL BRUISING OR BLEEDING  TENDERNESS IN MOUTH AND THROAT WITH OR WITHOUT PRESENCE OF ULCERS  *URINARY PROBLEMS  *BOWEL PROBLEMS  UNUSUAL RASH Items with * indicate a potential emergency and should be followed up as soon as possible.  Feel free to call the clinic you have any questions or concerns. The clinic phone number is [phone].    

## 2013-11-05 ENCOUNTER — Ambulatory Visit (HOSPITAL_BASED_OUTPATIENT_CLINIC_OR_DEPARTMENT_OTHER): Payer: Medicare Other

## 2013-11-05 VITALS — BP 114/59 | HR 63 | Temp 97.7°F | Resp 20

## 2013-11-05 DIAGNOSIS — C7A1 Malignant poorly differentiated neuroendocrine tumors: Secondary | ICD-10-CM

## 2013-11-05 DIAGNOSIS — C155 Malignant neoplasm of lower third of esophagus: Secondary | ICD-10-CM

## 2013-11-05 DIAGNOSIS — Z5111 Encounter for antineoplastic chemotherapy: Secondary | ICD-10-CM

## 2013-11-05 DIAGNOSIS — C7B8 Other secondary neuroendocrine tumors: Secondary | ICD-10-CM

## 2013-11-05 DIAGNOSIS — C159 Malignant neoplasm of esophagus, unspecified: Secondary | ICD-10-CM

## 2013-11-05 MED ORDER — ONDANSETRON 8 MG/50ML IVPB (CHCC)
8.0000 mg | Freq: Once | INTRAVENOUS | Status: AC
  Administered 2013-11-05: 8 mg via INTRAVENOUS

## 2013-11-05 MED ORDER — SODIUM CHLORIDE 0.9 % IV SOLN
65.0000 mg/m2 | Freq: Once | INTRAVENOUS | Status: AC
  Administered 2013-11-05: 120 mg via INTRAVENOUS
  Filled 2013-11-05: qty 6

## 2013-11-05 MED ORDER — SODIUM CHLORIDE 0.9 % IV SOLN
Freq: Once | INTRAVENOUS | Status: AC
  Administered 2013-11-05: 14:00:00 via INTRAVENOUS

## 2013-11-05 MED ORDER — ONDANSETRON 8 MG/NS 50 ML IVPB
INTRAVENOUS | Status: AC
  Filled 2013-11-05: qty 8

## 2013-11-05 NOTE — Patient Instructions (Signed)
[facility] Discharge Instructions for Patients Receiving Chemotherapy  Today you received the following chemotherapy agents: Etoposide.  To help prevent nausea and vomiting after your treatment, we encourage you to take your nausea medication as prescribed.   If you develop nausea and vomiting that is not controlled by your nausea medication, call the clinic.   BELOW ARE SYMPTOMS THAT SHOULD BE REPORTED IMMEDIATELY:  *FEVER GREATER THAN 100.5 F  *CHILLS WITH OR WITHOUT FEVER  NAUSEA AND VOMITING THAT IS NOT CONTROLLED WITH YOUR NAUSEA MEDICATION  *UNUSUAL SHORTNESS OF BREATH  *UNUSUAL BRUISING OR BLEEDING  TENDERNESS IN MOUTH AND THROAT WITH OR WITHOUT PRESENCE OF ULCERS  *URINARY PROBLEMS  *BOWEL PROBLEMS  UNUSUAL RASH Items with * indicate a potential emergency and should be followed up as soon as possible.  Feel free to call the clinic you have any questions or concerns. The clinic phone number is [phone].    

## 2013-11-06 ENCOUNTER — Ambulatory Visit (HOSPITAL_BASED_OUTPATIENT_CLINIC_OR_DEPARTMENT_OTHER): Payer: Medicare Other

## 2013-11-06 VITALS — BP 100/47 | HR 68 | Temp 97.5°F

## 2013-11-06 DIAGNOSIS — C7B8 Other secondary neuroendocrine tumors: Secondary | ICD-10-CM

## 2013-11-06 DIAGNOSIS — C155 Malignant neoplasm of lower third of esophagus: Secondary | ICD-10-CM

## 2013-11-06 DIAGNOSIS — C7A1 Malignant poorly differentiated neuroendocrine tumors: Secondary | ICD-10-CM

## 2013-11-06 DIAGNOSIS — C159 Malignant neoplasm of esophagus, unspecified: Secondary | ICD-10-CM

## 2013-11-06 MED ORDER — PEGFILGRASTIM INJECTION 6 MG/0.6ML
6.0000 mg | Freq: Once | SUBCUTANEOUS | Status: AC
  Administered 2013-11-06: 6 mg via SUBCUTANEOUS
  Filled 2013-11-06: qty 0.6

## 2013-11-11 ENCOUNTER — Ambulatory Visit (INDEPENDENT_AMBULATORY_CARE_PROVIDER_SITE_OTHER): Payer: Medicare Other

## 2013-11-11 VITALS — BP 113/60 | HR 68 | Resp 16 | Ht 67.0 in | Wt 159.0 lb

## 2013-11-11 DIAGNOSIS — Q828 Other specified congenital malformations of skin: Secondary | ICD-10-CM

## 2013-11-11 DIAGNOSIS — M79609 Pain in unspecified limb: Secondary | ICD-10-CM

## 2013-11-11 DIAGNOSIS — B351 Tinea unguium: Secondary | ICD-10-CM

## 2013-11-11 DIAGNOSIS — E1140 Type 2 diabetes mellitus with diabetic neuropathy, unspecified: Secondary | ICD-10-CM

## 2013-11-11 DIAGNOSIS — E1149 Type 2 diabetes mellitus with other diabetic neurological complication: Secondary | ICD-10-CM

## 2013-11-11 DIAGNOSIS — E1142 Type 2 diabetes mellitus with diabetic polyneuropathy: Secondary | ICD-10-CM

## 2013-11-11 NOTE — Patient Instructions (Signed)
Diabetes and Foot Care Diabetes may cause you to have problems because of poor blood supply (circulation) to your feet and legs. This may cause the skin on your feet to become thinner, break easier, and heal more slowly. Your skin may become dry, and the skin may peel and crack. You may also have nerve damage in your legs and feet causing decreased feeling in them. You may not notice minor injuries to your feet that could lead to infections or more serious problems. Taking care of your feet is one of the most important things you can do for yourself.  HOME CARE INSTRUCTIONS  Wear shoes at all times, even in the house. Do not go barefoot. Bare feet are easily injured.  Check your feet daily for blisters, cuts, and redness. If you cannot see the bottom of your feet, use a mirror or ask someone for help.  Wash your feet with warm water (do not use hot water) and mild soap. Then pat your feet and the areas between your toes until they are completely dry. Do not soak your feet as this can dry your skin.  Apply a moisturizing lotion or petroleum jelly (that does not contain alcohol and is unscented) to the skin on your feet and to dry, brittle toenails. Do not apply lotion between your toes.  Trim your toenails straight across. Do not dig under them or around the cuticle. File the edges of your nails with an emery board or nail file.  Do not cut corns or calluses or try to remove them with medicine.  Wear clean socks or stockings every day. Make sure they are not too tight. Do not wear knee-high stockings since they may decrease blood flow to your legs.  Wear shoes that fit properly and have enough cushioning. To break in new shoes, wear them for just a few hours a day. This prevents you from injuring your feet. Always look in your shoes before you put them on to be sure there are no objects inside.  Do not cross your legs. This may decrease the blood flow to your feet.  If you find a minor scrape,  cut, or break in the skin on your feet, keep it and the skin around it clean and dry. These areas may be cleansed with mild soap and water. Do not cleanse the area with peroxide, alcohol, or iodine.  When you remove an adhesive bandage, be sure not to damage the skin around it.  If you have a wound, look at it several times a day to make sure it is healing.  Do not use heating pads or hot water bottles. They may burn your skin. If you have lost feeling in your feet or legs, you may not know it is happening until it is too late.  Make sure your health care provider performs a complete foot exam at least annually or more often if you have foot problems. Report any cuts, sores, or bruises to your health care provider immediately. SEEK MEDICAL CARE IF:   You have an injury that is not healing.  You have cuts or breaks in the skin.  You have an ingrown nail.  You notice redness on your legs or feet.  You feel burning or tingling in your legs or feet.  You have pain or cramps in your legs and feet.  Your legs or feet are numb.  Your feet always feel cold. SEEK IMMEDIATE MEDICAL CARE IF:   There is increasing redness,   swelling, or pain in or around a wound.  There is a red line that goes up your leg.  Pus is coming from a wound.  You develop a fever or as directed by your health care provider.  You notice a bad smell coming from an ulcer or wound. Document Released: 11/10/2000 Document Revised: 07/16/2013 Document Reviewed: 04/22/2013 [employer] Patient Information 2014 [employer].  

## 2013-11-11 NOTE — Progress Notes (Signed)
   Subjective:    Patient ID: Billy Bush, male    DOB: 1941/01/21, 72 y.o.   MRN: [id number] "He's going to clip my nails and trim my feet."  HPI patient presents this time for nail care patient is currently under treatment for cancer esophageal cancer is been going to chemotherapy and radiation therapy. Has lost significant weight no recent several months. Definitely weakened and multiple GI effects.    Review of Systems  Constitutional: Positive for activity change, appetite change and fatigue.  HENT: Negative.   Eyes: Negative.   Respiratory: Negative.   Cardiovascular: Negative.   Gastrointestinal: Positive for nausea and diarrhea.  Endocrine: Negative.   Genitourinary: Negative.   Musculoskeletal: Negative.   Skin: Negative.   Allergic/Immunologic: Negative.   Neurological: Negative.   Hematological: Negative.   Psychiatric/Behavioral: Negative.        Objective:   Physical Exam Neurovascular status is diminished with decreased pedal pulses DP plus one over 4 PT nonpalpable bilateral. Epicritic and proprioceptive sensations diminished on Semmes Weinstein testing to forefoot and digits and plantar arch. Neurologically epicritic sensation intact other areas DTRs not elicited normal plantar response is noted orthopedic biomechanical exam reveals rectus foot type digital contractures noted no other abnormal findings noted. Dermatologically skin color pigment normal hair growth absent nails yellowed brittle discolored friable secondary onychomycosis now with chemotherapy and radiation therapy there may be more dystrophy of nails in the future. Nails tender on palpation no secondary infection is noted current time. Patient also keratoses sub-fifth bilateral secondary plantarflexed metatarsal       Assessment & Plan:  Assessment this time diabetes with peripheral neuropathy. Current treatment for cancer with chemotherapy and radiation also causing immunosuppression. Nails thick  brittle friable criptotic and incurvated debrided 1 through 5 bilateral. Also debridement multiple keratoses sub-5 bilateral in the presence of his diabetes and complications. Return for future palliative care Zahara Rembert months maintain accommodative shoes as instructed in ice to contact us with any changes in the interim. Lidocaine Neosporin applied to the keratoses after debridement sub-5 bilateral.  Alvan Dame DPM

## 2013-11-12 ENCOUNTER — Encounter: Payer: Self-pay | Admitting: Gastroenterology

## 2013-11-13 ENCOUNTER — Telehealth: Payer: Self-pay | Admitting: *Deleted

## 2013-11-13 ENCOUNTER — Other Ambulatory Visit (HOSPITAL_BASED_OUTPATIENT_CLINIC_OR_DEPARTMENT_OTHER): Payer: Medicare Other

## 2013-11-13 ENCOUNTER — Telehealth: Payer: Self-pay | Admitting: Oncology

## 2013-11-13 DIAGNOSIS — D6481 Anemia due to antineoplastic chemotherapy: Secondary | ICD-10-CM

## 2013-11-13 DIAGNOSIS — C7B8 Other secondary neuroendocrine tumors: Secondary | ICD-10-CM

## 2013-11-13 DIAGNOSIS — D696 Thrombocytopenia, unspecified: Secondary | ICD-10-CM

## 2013-11-13 DIAGNOSIS — C159 Malignant neoplasm of esophagus, unspecified: Secondary | ICD-10-CM

## 2013-11-13 DIAGNOSIS — T451X5A Adverse effect of antineoplastic and immunosuppressive drugs, initial encounter: Secondary | ICD-10-CM

## 2013-11-13 DIAGNOSIS — C7A1 Malignant poorly differentiated neuroendocrine tumors: Secondary | ICD-10-CM

## 2013-11-13 LAB — CBC WITH DIFFERENTIAL/PLATELET
BASO%: 1.4 % (ref 0.0–2.0)
Eosinophils Absolute: 0.1 10*3/uL (ref 0.0–0.5)
HCT: 24.7 % — ABNORMAL LOW (ref 38.4–49.9)
LYMPH%: 6.8 % — ABNORMAL LOW (ref 14.0–49.0)
MCH: 34.1 pg — ABNORMAL HIGH (ref 27.2–33.4)
MCHC: 34.4 g/dL (ref 32.0–36.0)
MCV: 99.1 fL — ABNORMAL HIGH (ref 79.3–98.0)
MONO#: 0.7 10*3/uL (ref 0.1–0.9)
MONO%: 9.1 % (ref 0.0–14.0)
NEUT#: 6.4 10*3/uL (ref 1.5–6.5)
Platelets: 79 10*3/uL — ABNORMAL LOW (ref 140–400)
RBC: 2.5 10*6/uL — ABNORMAL LOW (ref 4.20–5.82)
RDW: 19.7 % — ABNORMAL HIGH (ref 11.0–14.6)
WBC: 7.8 10*3/uL (ref 4.0–10.3)
lymph#: 0.5 10*3/uL — ABNORMAL LOW (ref 0.9–3.3)

## 2013-11-13 LAB — HOLD TUBE, BLOOD BANK

## 2013-11-13 NOTE — Telephone Encounter (Signed)
called pt and left message regarding lab on 12/23

## 2013-11-13 NOTE — Telephone Encounter (Signed)
Spoke with pt in lobby, he denies bleeding/ dyspnea or chest pain. Reports he is going out of town 12/30 wants to be sure counts are OK before he goes to [facility]. CBC reviewed by Dr. Truett Perna: Recheck lab 12/23. Called pt's wife wife, she understands to expect call from schedulers with lab appt 12/23.

## 2013-11-14 ENCOUNTER — Telehealth: Payer: Self-pay | Admitting: *Deleted

## 2013-11-14 NOTE — Telephone Encounter (Signed)
Call from pt's wife reporting he noticed small amount of blood in the tissue after blowing his nose. Also noticed small amount in sputum when he coughed. They have not noticed any blood in urine or with BM. Just wanted to make MD aware.  Reviewed with Dr. Truett Perna: Hold Aspirin. Continue Plavix. Call for increased bleeding. Check lab as scheduled 12/23. Mrs. Vandermeulen made aware. She reports pt has been on Plavix six years for cardiac stent.

## 2013-11-17 ENCOUNTER — Encounter: Payer: Self-pay | Admitting: Internal Medicine

## 2013-11-18 ENCOUNTER — Other Ambulatory Visit (HOSPITAL_BASED_OUTPATIENT_CLINIC_OR_DEPARTMENT_OTHER): Payer: Medicare Other

## 2013-11-18 DIAGNOSIS — D696 Thrombocytopenia, unspecified: Secondary | ICD-10-CM

## 2013-11-18 LAB — CBC WITH DIFFERENTIAL/PLATELET
BASO%: 0.5 % (ref 0.0–2.0)
Basophils Absolute: 0 10*3/uL (ref 0.0–0.1)
EOS%: 1.3 % (ref 0.0–7.0)
Eosinophils Absolute: 0.1 10*3/uL (ref 0.0–0.5)
HCT: 27.2 % — ABNORMAL LOW (ref 38.4–49.9)
HGB: 8.9 g/dL — ABNORMAL LOW (ref 13.0–17.1)
LYMPH%: 6 % — ABNORMAL LOW (ref 14.0–49.0)
MCV: 102.2 fL — ABNORMAL HIGH (ref 79.3–98.0)
MONO#: 0.7 10*3/uL (ref 0.1–0.9)
NEUT#: 6.4 10*3/uL (ref 1.5–6.5)
NEUT%: 82.7 % — ABNORMAL HIGH (ref 39.0–75.0)
Platelets: 100 10*3/uL — ABNORMAL LOW (ref 140–400)
RDW: 20.9 % — ABNORMAL HIGH (ref 11.0–14.6)
WBC: 7.8 10*3/uL (ref 4.0–10.3)
lymph#: 0.5 10*3/uL — ABNORMAL LOW (ref 0.9–3.3)

## 2013-11-24 ENCOUNTER — Encounter: Payer: Self-pay | Admitting: *Deleted

## 2013-11-24 ENCOUNTER — Telehealth: Payer: Self-pay | Admitting: *Deleted

## 2013-11-24 ENCOUNTER — Other Ambulatory Visit: Payer: Self-pay | Admitting: *Deleted

## 2013-11-24 NOTE — Telephone Encounter (Signed)
Called and informed patient's wife Rivka Barbara) that it is OK for husband to resume ASA 81mg  and plavix.  Per Dr. Truett Perna.  Patient's wife verbalized understanding.

## 2013-11-24 NOTE — Progress Notes (Signed)
Pt informed to resume ASA 81 mg & plavix as prescribed per Dr Truett Perna by Gardiner Fanti LPN.

## 2013-11-27 ENCOUNTER — Other Ambulatory Visit: Payer: Self-pay | Admitting: Oncology

## 2013-12-02 ENCOUNTER — Ambulatory Visit (HOSPITAL_BASED_OUTPATIENT_CLINIC_OR_DEPARTMENT_OTHER): Payer: Medicare HMO

## 2013-12-02 ENCOUNTER — Ambulatory Visit (HOSPITAL_BASED_OUTPATIENT_CLINIC_OR_DEPARTMENT_OTHER): Payer: Medicare HMO | Admitting: Nurse Practitioner

## 2013-12-02 ENCOUNTER — Telehealth: Payer: Self-pay | Admitting: Cardiology

## 2013-12-02 ENCOUNTER — Telehealth: Payer: Self-pay | Admitting: Oncology

## 2013-12-02 ENCOUNTER — Other Ambulatory Visit (HOSPITAL_BASED_OUTPATIENT_CLINIC_OR_DEPARTMENT_OTHER): Payer: Medicare HMO

## 2013-12-02 VITALS — BP 99/60 | HR 63 | Temp 97.0°F | Resp 18 | Ht 67.0 in | Wt 165.6 lb

## 2013-12-02 DIAGNOSIS — C7A1 Malignant poorly differentiated neuroendocrine tumors: Secondary | ICD-10-CM

## 2013-12-02 DIAGNOSIS — C7B8 Other secondary neuroendocrine tumors: Secondary | ICD-10-CM

## 2013-12-02 DIAGNOSIS — I5022 Chronic systolic (congestive) heart failure: Secondary | ICD-10-CM

## 2013-12-02 DIAGNOSIS — Z5111 Encounter for antineoplastic chemotherapy: Secondary | ICD-10-CM

## 2013-12-02 DIAGNOSIS — D6959 Other secondary thrombocytopenia: Secondary | ICD-10-CM

## 2013-12-02 DIAGNOSIS — C159 Malignant neoplasm of esophagus, unspecified: Secondary | ICD-10-CM

## 2013-12-02 DIAGNOSIS — I959 Hypotension, unspecified: Secondary | ICD-10-CM

## 2013-12-02 DIAGNOSIS — D6481 Anemia due to antineoplastic chemotherapy: Secondary | ICD-10-CM

## 2013-12-02 DIAGNOSIS — C155 Malignant neoplasm of lower third of esophagus: Secondary | ICD-10-CM

## 2013-12-02 DIAGNOSIS — T451X5A Adverse effect of antineoplastic and immunosuppressive drugs, initial encounter: Secondary | ICD-10-CM

## 2013-12-02 DIAGNOSIS — E119 Type 2 diabetes mellitus without complications: Secondary | ICD-10-CM

## 2013-12-02 LAB — CBC WITH DIFFERENTIAL/PLATELET
BASO%: 0.2 % (ref 0.0–2.0)
BASOS ABS: 0 10*3/uL (ref 0.0–0.1)
EOS ABS: 0.1 10*3/uL (ref 0.0–0.5)
EOS%: 1.4 % (ref 0.0–7.0)
HCT: 26.4 % — ABNORMAL LOW (ref 38.4–49.9)
HGB: 8.4 g/dL — ABNORMAL LOW (ref 13.0–17.1)
LYMPH#: 0.6 10*3/uL — AB (ref 0.9–3.3)
LYMPH%: 5.7 % — ABNORMAL LOW (ref 14.0–49.0)
MCH: 32.8 pg (ref 27.2–33.4)
MCHC: 31.8 g/dL — AB (ref 32.0–36.0)
MCV: 103.1 fL — ABNORMAL HIGH (ref 79.3–98.0)
MONO#: 0.8 10*3/uL (ref 0.1–0.9)
MONO%: 7.5 % (ref 0.0–14.0)
NEUT#: 8.8 10*3/uL — ABNORMAL HIGH (ref 1.5–6.5)
NEUT%: 85.2 % — ABNORMAL HIGH (ref 39.0–75.0)
Platelets: 110 10*3/uL — ABNORMAL LOW (ref 140–400)
RBC: 2.56 10*6/uL — ABNORMAL LOW (ref 4.20–5.82)
RDW: 17.7 % — ABNORMAL HIGH (ref 11.0–14.6)
WBC: 10.3 10*3/uL (ref 4.0–10.3)

## 2013-12-02 LAB — COMPREHENSIVE METABOLIC PANEL (CC13)
ALBUMIN: 3.2 g/dL — AB (ref 3.5–5.0)
ALT: 16 U/L (ref 0–55)
ANION GAP: 6 meq/L (ref 3–11)
AST: 18 U/L (ref 5–34)
Alkaline Phosphatase: 115 U/L (ref 40–150)
BUN: 14.5 mg/dL (ref 7.0–26.0)
CO2: 27 meq/L (ref 22–29)
Calcium: 8.9 mg/dL (ref 8.4–10.4)
Chloride: 102 mEq/L (ref 98–109)
Creatinine: 0.9 mg/dL (ref 0.7–1.3)
GLUCOSE: 198 mg/dL — AB (ref 70–140)
Potassium: 4.3 mEq/L (ref 3.5–5.1)
SODIUM: 135 meq/L — AB (ref 136–145)
TOTAL PROTEIN: 6.3 g/dL — AB (ref 6.4–8.3)
Total Bilirubin: 1.22 mg/dL — ABNORMAL HIGH (ref 0.20–1.20)

## 2013-12-02 MED ORDER — DEXAMETHASONE SODIUM PHOSPHATE 20 MG/5ML IJ SOLN
INTRAMUSCULAR | Status: AC
  Filled 2013-12-02: qty 5

## 2013-12-02 MED ORDER — ONDANSETRON 16 MG/50ML IVPB (CHCC)
16.0000 mg | Freq: Once | INTRAVENOUS | Status: AC
  Administered 2013-12-02: 16 mg via INTRAVENOUS

## 2013-12-02 MED ORDER — SODIUM CHLORIDE 0.9 % IV SOLN
284.7000 mg | Freq: Once | INTRAVENOUS | Status: AC
  Administered 2013-12-02: 280 mg via INTRAVENOUS
  Filled 2013-12-02: qty 28

## 2013-12-02 MED ORDER — DEXAMETHASONE SODIUM PHOSPHATE 20 MG/5ML IJ SOLN
20.0000 mg | Freq: Once | INTRAMUSCULAR | Status: AC
  Administered 2013-12-02: 20 mg via INTRAVENOUS

## 2013-12-02 MED ORDER — LOSARTAN POTASSIUM 50 MG PO TABS: 50.0000 mg | ORAL_TABLET | Freq: Every day | ORAL | Status: DC

## 2013-12-02 MED ORDER — ONDANSETRON 16 MG/50ML IVPB (CHCC)
INTRAVENOUS | Status: AC
  Filled 2013-12-02: qty 16

## 2013-12-02 MED ORDER — SODIUM CHLORIDE 0.9 % IV SOLN
Freq: Once | INTRAVENOUS | Status: AC
  Administered 2013-12-02: 12:00:00 via INTRAVENOUS

## 2013-12-02 MED ORDER — SODIUM CHLORIDE 0.9 % IV SOLN
65.0000 mg/m2 | Freq: Once | INTRAVENOUS | Status: AC
  Administered 2013-12-02: 120 mg via INTRAVENOUS
  Filled 2013-12-02: qty 6

## 2013-12-02 NOTE — Telephone Encounter (Signed)
gv pt/relative appt schedule for january and february.

## 2013-12-02 NOTE — Telephone Encounter (Signed)
New message    Dr. Loanne Drilling is suggestion patient to come off mediation Prinivil x 4 weeks due to tickle in throat.     Should a different medication be prescribe.

## 2013-12-02 NOTE — Progress Notes (Signed)
OFFICE PROGRESS NOTE  Interval history:  [name] returns as scheduled. He completed cycle 2 carboplatin/etoposide beginning 11/03/2013. He denies nausea/vomiting. No mouth sores. He had a few episodes of loose stools. He has a good appetite and is gaining weight. No dysphagia. He is tolerating a regular diet without difficulty. He denies pain. No falls. He has a good energy level. He denies bleeding.   Objective: Filed Vitals:   12/02/13 1025  BP: 99/60  Pulse: 63  Temp: 97 F (36.1 C)  Resp: 18     No thrush or ulcerations. Lungs clear. Regular cardiac rhythm with occasional premature beat. 2/6 systolic murmur. Abdomen soft and nontender. No organomegaly. No leg edema.  Lab Results: Lab Results  Component Value Date   WBC 10.3 12/02/2013   HGB 8.4* 12/02/2013   HCT 26.4* 12/02/2013   MCV 103.1* 12/02/2013   PLT 110* 12/02/2013   NEUTROABS 8.8* 12/02/2013    Chemistry:    Chemistry      Component Value Date/Time   NA 136 11/03/2013 1153   NA 137 10/27/2013 1116   K 4.3 11/03/2013 1153   K 4.4 10/27/2013 1116   CL 101 10/27/2013 1116   CO2 26 11/03/2013 1153   CO2 29 10/27/2013 1116   BUN 15.5 11/03/2013 1153   BUN 16 10/27/2013 1116   CREATININE 0.9 11/03/2013 1153   CREATININE 0.9 10/27/2013 1116      Component Value Date/Time   CALCIUM 9.1 11/03/2013 1153   CALCIUM 8.9 10/27/2013 1116   ALKPHOS 110 11/03/2013 1153   ALKPHOS 83 10/27/2013 1116   AST 28 11/03/2013 1153   AST 32 10/27/2013 1116   ALT 26 11/03/2013 1153   ALT 26 10/27/2013 1116   BILITOT 0.96 11/03/2013 1153   BILITOT 0.8 10/27/2013 1116       Studies/Results: No results found.  Medications: I have reviewed the patient's current medications.  Assessment/Plan: 1. High-grade neuroendocrine carcinoma of the esophagus, clinical stage III. Staging PET scan 07/23/2013 showed a large distal esophageal mass with neoplastic range FDG uptake, metastatic upper abdominal lymphadenopathy, indeterminate small pulmonary  nodules. -initiation of radiation on 08/06/2013. Radiation completed 09/12/2013.  -Initiation of weekly carboplatin 08/07/2013. Final weekly carboplatin 08/28/2013.  -Restaging CT of the chest, abdomen, and pelvis on 09/25/2013: Decreased esophagus mass, decreased upper abdomen adenopathy. No new or progressive disease per  -Cycle 1 etoposide/carboplatin 10/06/2013.  -Cycle 2 carboplatin/etoposide beginning 11/03/2013. Dose reduction of the carboplatin and etoposide with cycle 2 due to hematologic toxicity following cycle 1. 2. Dysphagia secondary to #1. Resolved. 3. History of coronary artery disease. 4. Congestive heart failure. 5. Diabetes. 6. History of Weight loss. He is gaining weight. 7. Mild hypotension.  8. 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 Stable. 9. Thrombocytopenia secondary to chemotherapy. Stable. 10. Hospitalization 10/20/2013 following a fall.   Dispositon-he appears stable. Plan to proceed with cycle 3 carboplatin/etoposide today as scheduled. We will obtain a nadir CBC on day 10. He will return for a followup visit and cycle 4 carboplatin/etoposide on 12/29/2013. He will contact the office in the interim with any problems.  Plan reviewed with Dr. Benay Spice.   Ned Card ANP/GNP-BC

## 2013-12-02 NOTE — Patient Instructions (Signed)
[facility] Discharge Instructions for Patients Receiving Chemotherapy  Today you received the following chemotherapy agents Carboplatin/VP 16 (Etoposide) To help prevent nausea and vomiting after your treatment, we encourage you to take your nausea medication as prescribed.  If you develop nausea and vomiting that is not controlled by your nausea medication, call the clinic.   BELOW ARE SYMPTOMS THAT SHOULD BE REPORTED IMMEDIATELY:  *FEVER GREATER THAN 100.5 F  *CHILLS WITH OR WITHOUT FEVER  NAUSEA AND VOMITING THAT IS NOT CONTROLLED WITH YOUR NAUSEA MEDICATION  *UNUSUAL SHORTNESS OF BREATH  *UNUSUAL BRUISING OR BLEEDING  TENDERNESS IN MOUTH AND THROAT WITH OR WITHOUT PRESENCE OF ULCERS  *URINARY PROBLEMS  *BOWEL PROBLEMS  UNUSUAL RASH Items with * indicate a potential emergency and should be followed up as soon as possible.  Feel free to call the clinic you have any questions or concerns. The clinic phone number is [phone].    

## 2013-12-02 NOTE — Telephone Encounter (Signed)
Spoke with patient's wife. Dr Loanne Drilling recommended pt stop ramipril to see if that helped tickle in his throat. Pt has been off ramipril now for 4 weeks and the tickle in his throat has resolved. Pt's wife asking Dr Claris Gladden recommendation about taking a medication in the place of ramipril. I will forward to Dr Aundra Dubin for review.

## 2013-12-02 NOTE — Telephone Encounter (Signed)
Pt's wife advised, verbalized understanding, will plan on BMET 12/11/13 at [facility].

## 2013-12-02 NOTE — Telephone Encounter (Signed)
Losartan 50 mg daily with BMET in 2 wks.

## 2013-12-02 NOTE — Progress Notes (Signed)
Ok to treat per Ned Card, NP

## 2013-12-03 ENCOUNTER — Ambulatory Visit (HOSPITAL_BASED_OUTPATIENT_CLINIC_OR_DEPARTMENT_OTHER): Payer: Medicare HMO

## 2013-12-03 VITALS — BP 99/54 | HR 63 | Temp 96.9°F | Resp 20

## 2013-12-03 DIAGNOSIS — C159 Malignant neoplasm of esophagus, unspecified: Secondary | ICD-10-CM

## 2013-12-03 DIAGNOSIS — Z5111 Encounter for antineoplastic chemotherapy: Secondary | ICD-10-CM

## 2013-12-03 DIAGNOSIS — C7A1 Malignant poorly differentiated neuroendocrine tumors: Secondary | ICD-10-CM

## 2013-12-03 DIAGNOSIS — C7B8 Other secondary neuroendocrine tumors: Secondary | ICD-10-CM

## 2013-12-03 DIAGNOSIS — C155 Malignant neoplasm of lower third of esophagus: Secondary | ICD-10-CM

## 2013-12-03 MED ORDER — SODIUM CHLORIDE 0.9 % IV SOLN
Freq: Once | INTRAVENOUS | Status: AC
  Administered 2013-12-03: 11:00:00 via INTRAVENOUS

## 2013-12-03 MED ORDER — SODIUM CHLORIDE 0.9 % IV SOLN
65.0000 mg/m2 | Freq: Once | INTRAVENOUS | Status: AC
  Administered 2013-12-03: 120 mg via INTRAVENOUS
  Filled 2013-12-03: qty 6

## 2013-12-03 MED ORDER — ONDANSETRON 8 MG/50ML IVPB (CHCC)
8.0000 mg | Freq: Once | INTRAVENOUS | Status: AC
  Administered 2013-12-03: 8 mg via INTRAVENOUS

## 2013-12-03 MED ORDER — ONDANSETRON 8 MG/NS 50 ML IVPB
INTRAVENOUS | Status: AC
  Filled 2013-12-03: qty 8

## 2013-12-03 NOTE — Patient Instructions (Signed)
[facility] Discharge Instructions for Patients Receiving Chemotherapy  Today you received the following chemotherapy agents VP 16 (Etoposide) To help prevent nausea and vomiting after your treatment, we encourage you to take your nausea medication as prescribed.   If you develop nausea and vomiting that is not controlled by your nausea medication, call the clinic.   BELOW ARE SYMPTOMS THAT SHOULD BE REPORTED IMMEDIATELY:  *FEVER GREATER THAN 100.5 F  *CHILLS WITH OR WITHOUT FEVER  NAUSEA AND VOMITING THAT IS NOT CONTROLLED WITH YOUR NAUSEA MEDICATION  *UNUSUAL SHORTNESS OF BREATH  *UNUSUAL BRUISING OR BLEEDING  TENDERNESS IN MOUTH AND THROAT WITH OR WITHOUT PRESENCE OF ULCERS  *URINARY PROBLEMS  *BOWEL PROBLEMS  UNUSUAL RASH Items with * indicate a potential emergency and should be followed up as soon as possible.  Feel free to call the clinic you have any questions or concerns. The clinic phone number is [phone].    

## 2013-12-04 ENCOUNTER — Ambulatory Visit (HOSPITAL_BASED_OUTPATIENT_CLINIC_OR_DEPARTMENT_OTHER): Payer: Medicare HMO

## 2013-12-04 ENCOUNTER — Telehealth: Payer: Self-pay | Admitting: Cardiology

## 2013-12-04 VITALS — BP 93/43 | HR 63 | Temp 97.6°F | Resp 18

## 2013-12-04 DIAGNOSIS — C7A1 Malignant poorly differentiated neuroendocrine tumors: Secondary | ICD-10-CM

## 2013-12-04 DIAGNOSIS — C159 Malignant neoplasm of esophagus, unspecified: Secondary | ICD-10-CM

## 2013-12-04 DIAGNOSIS — C7B8 Other secondary neuroendocrine tumors: Secondary | ICD-10-CM

## 2013-12-04 DIAGNOSIS — C155 Malignant neoplasm of lower third of esophagus: Secondary | ICD-10-CM

## 2013-12-04 DIAGNOSIS — I5022 Chronic systolic (congestive) heart failure: Secondary | ICD-10-CM

## 2013-12-04 DIAGNOSIS — Z5111 Encounter for antineoplastic chemotherapy: Secondary | ICD-10-CM

## 2013-12-04 MED ORDER — ONDANSETRON 8 MG/50ML IVPB (CHCC)
8.0000 mg | Freq: Once | INTRAVENOUS | Status: AC
  Administered 2013-12-04: 8 mg via INTRAVENOUS

## 2013-12-04 MED ORDER — SODIUM CHLORIDE 0.9 % IV SOLN
Freq: Once | INTRAVENOUS | Status: AC
  Administered 2013-12-04: 10:00:00 via INTRAVENOUS

## 2013-12-04 MED ORDER — ETOPOSIDE CHEMO INJECTION 1 GM/50ML
65.0000 mg/m2 | Freq: Once | INTRAVENOUS | Status: AC
  Administered 2013-12-04: 120 mg via INTRAVENOUS
  Filled 2013-12-04: qty 6

## 2013-12-04 MED ORDER — ONDANSETRON 8 MG/NS 50 ML IVPB
INTRAVENOUS | Status: AC
  Filled 2013-12-04: qty 8

## 2013-12-04 NOTE — Patient Instructions (Addendum)
[facility] Discharge Instructions for Patients Receiving Chemotherapy  Check blood pressure daily at home. Hold blood pressure medicine if systolic (top number) is less than 90. Call Dr. Benay Spice if you are dizzy or blood pressure is consistently low.  Today you received the following chemotherapy agents: Etoposide  To help prevent nausea and vomiting after your treatment, we encourage you to take your nausea medication as prescribed.   If you develop nausea and vomiting that is not controlled by your nausea medication, call the clinic.   BELOW ARE SYMPTOMS THAT SHOULD BE REPORTED IMMEDIATELY:  *FEVER GREATER THAN 100.5 F  *CHILLS WITH OR WITHOUT FEVER  NAUSEA AND VOMITING THAT IS NOT CONTROLLED WITH YOUR NAUSEA MEDICATION  *UNUSUAL SHORTNESS OF BREATH  *UNUSUAL BRUISING OR BLEEDING  TENDERNESS IN MOUTH AND THROAT WITH OR WITHOUT PRESENCE OF ULCERS  *URINARY PROBLEMS  *BOWEL PROBLEMS  UNUSUAL RASH Items with * indicate a potential emergency and should be followed up as soon as possible.  Feel free to call the clinic you have any questions or concerns. The clinic phone number is [phone].   Dehydration  Dehydration means your body does not have as much fluid as it needs. Your kidneys, brain, and heart will not work properly without the right amount of fluids and salt. Older adults are more likely to become dehydrated than younger adults. This is because:   Their bodies do not hold water as well.  Their bodies do not respond to temperature as well.  They do not get thirsty as easily or as quickly.  HOME CARE  Ask your doctor how to replace body fluid losses (rehydrate).  Drink enough fluids to keep your pee (urine) clear or pale yellow.  Drink small amounts of fluids often if you feel sick to your stomach (nauseous) or throw up (vomit).  Eat like you normally do.  Avoid:  Foods or drinks high in sugar.  Bubbly (carbonated)  drinks.  Juice.  Very hot or cold fluids.  Drinks with caffeine.  Fatty, greasy foods.  Alcohol.  Tobacco.  Eating too much.  Gelatin desserts.  Wash your hands to avoid spreading germs (bacteria, viruses).  Only take medicine as told by your doctor.  Keep all doctor visits as told.  GET HELP RIGHT AWAY IF:   You cannot drink fluid without throwing up.  You get worse even with treatment.  Your vomit has blood in it or looks greenish.  Your poop (stool) has blood in it or looks black and tarry.  You have not peed in 6 to 8 hours.  You pee a small amount of very dark pee.  You have a fever.  You pass out (faint).  You have belly (abdominal) pain that gets worse or stays in one spot (localizes).  You have a rash, stiff neck, or bad headache.  You get easily annoyed, sleepy, or are hard to wake up.  You feel weak, dizzy, or very thirsty.  MAKE SURE YOU:   Understand these instructions.  Will watch your condition.  Will get help right away if you are not doing well or get worse. Document Released: 11/02/2011 Document Revised: 02/05/2012 Document Reviewed: 11/02/2011 [facility] Patient Information 2014 [place], [place].

## 2013-12-04 NOTE — Telephone Encounter (Signed)
Pt's wife states pt took first dose of losartan 50mg  last evening. Today when he went for treatment it was 84/43. He was given a little fluid and is back home now. His BP now is 105-109/55-67. Reviewed with Dr Vassie Moment recommended pt decrease losartan to 25mg  daily at bedtime.

## 2013-12-04 NOTE — Telephone Encounter (Signed)
New message  Patient starts taking Lasartin today and BP is very low. He went to cancer center treatment and they are giving fluids before he leaves. Please call wife and advise.

## 2013-12-04 NOTE — Progress Notes (Signed)
Dr. Benay Spice saw patient in infusion area. MD educated patient to hold blood pressure medication if systolic is less than 90 and that he should check BP daily at home. Pt and son also told to call [place] if he is dizzy or blood pressure is consistently low. This information was printed onto patient's AVS as well.

## 2013-12-04 NOTE — Telephone Encounter (Signed)
Pt's wife advised, verbalized understanding.

## 2013-12-05 ENCOUNTER — Ambulatory Visit (HOSPITAL_BASED_OUTPATIENT_CLINIC_OR_DEPARTMENT_OTHER): Payer: Medicare HMO

## 2013-12-05 VITALS — BP 92/52 | HR 66 | Temp 98.0°F

## 2013-12-05 DIAGNOSIS — C159 Malignant neoplasm of esophagus, unspecified: Secondary | ICD-10-CM

## 2013-12-05 DIAGNOSIS — C155 Malignant neoplasm of lower third of esophagus: Secondary | ICD-10-CM

## 2013-12-05 DIAGNOSIS — C7B8 Other secondary neuroendocrine tumors: Secondary | ICD-10-CM

## 2013-12-05 DIAGNOSIS — C7A1 Malignant poorly differentiated neuroendocrine tumors: Secondary | ICD-10-CM

## 2013-12-05 MED ORDER — PEGFILGRASTIM INJECTION 6 MG/0.6ML
6.0000 mg | Freq: Once | SUBCUTANEOUS | Status: AC
  Administered 2013-12-05: 6 mg via SUBCUTANEOUS
  Filled 2013-12-05: qty 0.6

## 2013-12-11 ENCOUNTER — Other Ambulatory Visit: Payer: Self-pay | Admitting: *Deleted

## 2013-12-11 ENCOUNTER — Ambulatory Visit (HOSPITAL_COMMUNITY)
Admission: RE | Admit: 2013-12-11 | Discharge: 2013-12-11 | Disposition: A | Discharge status: Home / Self Care | Payer: Medicare HMO | Source: Ambulatory Visit | Attending: Oncology | Admitting: Oncology

## 2013-12-11 ENCOUNTER — Other Ambulatory Visit (HOSPITAL_BASED_OUTPATIENT_CLINIC_OR_DEPARTMENT_OTHER): Payer: Medicare HMO

## 2013-12-11 ENCOUNTER — Ambulatory Visit (HOSPITAL_BASED_OUTPATIENT_CLINIC_OR_DEPARTMENT_OTHER): Payer: Medicare HMO

## 2013-12-11 VITALS — BP 116/59 | HR 68 | Temp 98.1°F | Resp 18

## 2013-12-11 DIAGNOSIS — D649 Anemia, unspecified: Secondary | ICD-10-CM

## 2013-12-11 DIAGNOSIS — C7A1 Malignant poorly differentiated neuroendocrine tumors: Secondary | ICD-10-CM

## 2013-12-11 DIAGNOSIS — I5022 Chronic systolic (congestive) heart failure: Secondary | ICD-10-CM

## 2013-12-11 DIAGNOSIS — C159 Malignant neoplasm of esophagus, unspecified: Secondary | ICD-10-CM

## 2013-12-11 LAB — CBC WITH DIFFERENTIAL/PLATELET
BASO%: 0.3 % (ref 0.0–2.0)
Basophils Absolute: 0 10*3/uL (ref 0.0–0.1)
EOS ABS: 0 10*3/uL (ref 0.0–0.5)
EOS%: 1.1 % (ref 0.0–7.0)
HCT: 21.2 % — ABNORMAL LOW (ref 38.4–49.9)
HGB: 6.8 g/dL — CL (ref 13.0–17.1)
LYMPH%: 6.1 % — ABNORMAL LOW (ref 14.0–49.0)
MCH: 32.7 pg (ref 27.2–33.4)
MCHC: 32.1 g/dL (ref 32.0–36.0)
MCV: 101.9 fL — AB (ref 79.3–98.0)
MONO#: 0.3 10*3/uL (ref 0.1–0.9)
MONO%: 8.7 % (ref 0.0–14.0)
NEUT%: 83.8 % — ABNORMAL HIGH (ref 39.0–75.0)
NEUTROS ABS: 3 10*3/uL (ref 1.5–6.5)
PLATELETS: 47 10*3/uL — AB (ref 140–400)
RBC: 2.08 10*6/uL — AB (ref 4.20–5.82)
RDW: 16.2 % — AB (ref 11.0–14.6)
WBC: 3.6 10*3/uL — AB (ref 4.0–10.3)
lymph#: 0.2 10*3/uL — ABNORMAL LOW (ref 0.9–3.3)

## 2013-12-11 LAB — PREPARE RBC (CROSSMATCH)

## 2013-12-11 LAB — ABO/RH: ABO/RH(D): A POS

## 2013-12-11 LAB — HOLD TUBE, BLOOD BANK

## 2013-12-11 MED ORDER — SODIUM CHLORIDE 0.9 % IV SOLN
250.0000 mL | Freq: Once | INTRAVENOUS | Status: AC
  Administered 2013-12-11: 250 mL via INTRAVENOUS

## 2013-12-11 NOTE — Patient Instructions (Signed)
Blood Transfusion Information WHAT IS A BLOOD TRANSFUSION? A transfusion is the replacement of blood or some of its parts. Blood is made up of multiple cells which provide different functions.  Red blood cells carry oxygen and are used for blood loss replacement.  White blood cells fight against infection.  Platelets control bleeding.  Plasma helps clot blood.  Other blood products are available for specialized needs, such as hemophilia or other clotting disorders. BEFORE THE TRANSFUSION  Who gives blood for transfusions?   You may be able to donate blood to be used at a later date on yourself (autologous donation).  Relatives can be asked to donate blood. This is generally not any safer than if you have received blood from a stranger. The same precautions are taken to ensure safety when a relative's blood is donated.  Healthy volunteers who are fully evaluated to make sure their blood is safe. This is blood bank blood. Transfusion therapy is the safest it has ever been in the practice of medicine. Before blood is taken from a donor, a complete history is taken to make sure that person has no history of diseases nor engages in risky social behavior (examples are intravenous drug use or sexual activity with multiple partners). The donor's travel history is screened to minimize risk of transmitting infections, such as malaria. The donated blood is tested for signs of infectious diseases, such as HIV and hepatitis. The blood is then tested to be sure it is compatible with you in order to minimize the chance of a transfusion reaction. If you or a relative donates blood, this is often done in anticipation of surgery and is not appropriate for emergency situations. It takes many days to process the donated blood. RISKS AND COMPLICATIONS Although transfusion therapy is very safe and saves many lives, the main dangers of transfusion include:   Getting an infectious disease.  Developing a  transfusion reaction. This is an allergic reaction to something in the blood you were given. Every precaution is taken to prevent this. The decision to have a blood transfusion has been considered carefully by your caregiver before blood is given. Blood is not given unless the benefits outweigh the risks. AFTER THE TRANSFUSION  Right after receiving a blood transfusion, you will usually feel much better and more energetic. This is especially true if your red blood cells have gotten low (anemic). The transfusion raises the level of the red blood cells which carry oxygen, and this usually causes an energy increase.  The nurse administering the transfusion will monitor you carefully for complications. HOME CARE INSTRUCTIONS  No special instructions are needed after a transfusion. You may find your energy is better. Speak with your caregiver about any limitations on activity for underlying diseases you may have. SEEK MEDICAL CARE IF:   Your condition is not improving after your transfusion.  You develop redness or irritation at the intravenous (IV) site. SEEK IMMEDIATE MEDICAL CARE IF:  Any of the following symptoms occur over the next 12 hours:  Shaking chills.  You have a temperature by mouth above 102 F (38.9 C), not controlled by medicine.  Chest, back, or muscle pain.  People around you feel you are not acting correctly or are confused.  Shortness of breath or difficulty breathing.  Dizziness and fainting.  You get a rash or develop hives.  You have a decrease in urine output.  Your urine turns a dark color or changes to pink, red, or brown. Any of the following   symptoms occur over the next 10 days:  You have a temperature by mouth above 102 F (38.9 C), not controlled by medicine.  Shortness of breath.  Weakness after normal activity.  The white part of the eye turns yellow (jaundice).  You have a decrease in the amount of urine or are urinating less often.  Your  urine turns a dark color or changes to pink, red, or brown. Document Released: 11/10/2000 Document Revised: 02/05/2012 Document Reviewed: 06/29/2008 [employer] Patient Information 2014 [employer].  

## 2013-12-11 NOTE — Progress Notes (Signed)
Patient c/o congestion and productive cough with clear sputum. Pt taking OTC mucinex and delsym but wants to know if he can take his wife's prescription of cough syrup because the cough is keeping him up at night. Dr. Benay Spice notified, per MD, take robitussin for cough and refrain from taking wife's medication. Patient and son verbalize understanding.

## 2013-12-15 LAB — TYPE AND SCREEN
ABO/RH(D): A POS
Antibody Screen: NEGATIVE
Unit division: 0
Unit division: 0

## 2013-12-25 ENCOUNTER — Other Ambulatory Visit: Payer: Medicare Other

## 2013-12-25 ENCOUNTER — Encounter: Payer: Self-pay | Admitting: *Deleted

## 2013-12-25 ENCOUNTER — Ambulatory Visit (INDEPENDENT_AMBULATORY_CARE_PROVIDER_SITE_OTHER): Payer: Medicare HMO | Admitting: Cardiology

## 2013-12-25 ENCOUNTER — Encounter: Payer: Self-pay | Admitting: Cardiology

## 2013-12-25 VITALS — BP 100/60 | HR 68 | Ht 67.0 in | Wt 176.0 lb

## 2013-12-25 DIAGNOSIS — I2589 Other forms of chronic ischemic heart disease: Secondary | ICD-10-CM

## 2013-12-25 DIAGNOSIS — E785 Hyperlipidemia, unspecified: Secondary | ICD-10-CM

## 2013-12-25 DIAGNOSIS — I2510 Atherosclerotic heart disease of native coronary artery without angina pectoris: Secondary | ICD-10-CM

## 2013-12-25 DIAGNOSIS — D696 Thrombocytopenia, unspecified: Secondary | ICD-10-CM

## 2013-12-25 DIAGNOSIS — I5022 Chronic systolic (congestive) heart failure: Secondary | ICD-10-CM

## 2013-12-25 DIAGNOSIS — I25810 Atherosclerosis of coronary artery bypass graft(s) without angina pectoris: Secondary | ICD-10-CM

## 2013-12-25 LAB — LIPID PANEL
CHOL/HDL RATIO: 2
Cholesterol: 125 mg/dL (ref 0–200)
HDL: 50.7 mg/dL (ref >39.00)
LDL Cholesterol: 62 mg/dL (ref 0–99)
Triglycerides: 60 mg/dL (ref 0.0–149.0)
VLDL: 12 mg/dL (ref 0.0–40.0)

## 2013-12-25 LAB — CBC WITH DIFFERENTIAL/PLATELET
Basophils Absolute: 0 10*3/uL (ref 0.0–0.1)
Basophils Relative: 0 % (ref 0.0–3.0)
EOS ABS: 0.1 10*3/uL (ref 0.0–0.7)
Eosinophils Relative: 0.8 % (ref 0.0–5.0)
HCT: 26.4 % — ABNORMAL LOW (ref 39.0–52.0)
Hemoglobin: 8.6 g/dL — ABNORMAL LOW (ref 13.0–17.0)
LYMPHS PCT: 5.4 % — AB (ref 12.0–46.0)
Lymphs Abs: 0.5 10*3/uL — ABNORMAL LOW (ref 0.7–4.0)
MCHC: 32.5 g/dL (ref 30.0–36.0)
MCV: 102.5 fl — AB (ref 78.0–100.0)
MONO ABS: 0.7 10*3/uL (ref 0.1–1.0)
Monocytes Relative: 8.5 % (ref 3.0–12.0)
Neutro Abs: 7.4 10*3/uL (ref 1.4–7.7)
Neutrophils Relative %: 85.3 % — ABNORMAL HIGH (ref 43.0–77.0)
PLATELETS: 115 10*3/uL — AB (ref 150.0–400.0)
RBC: 2.58 Mil/uL — ABNORMAL LOW (ref 4.22–5.81)
RDW: 22.7 % — ABNORMAL HIGH (ref 11.5–14.6)
WBC: 8.7 10*3/uL (ref 4.5–10.5)

## 2013-12-25 MED ORDER — FUROSEMIDE 80 MG PO TABS: ORAL_TABLET | ORAL | Status: DC

## 2013-12-25 MED ORDER — POTASSIUM CHLORIDE CRYS ER 20 MEQ PO TBCR: 20.0000 meq | EXTENDED_RELEASE_TABLET | Freq: Every day | ORAL | Status: DC

## 2013-12-25 NOTE — Patient Instructions (Signed)
Stop Plavix for now.  Increase lasix (furosemide) to 80mg  in the AM and 40mg  in the PM. This will be 1 of your 80mg  tablets in the AM and 1/2 of you 80mg  tablets in the PM  Start KCL(potassium) 20 mEq daily.  Your physician recommends that you have lab today--CBCd  Your physician recommends that you return for lab work in: 10 days--CBCd/BMET/BNP.  Your physician has requested that you have an echocardiogram. Echocardiography is a painless test that uses sound waves to create images of your heart. It provides your doctor with information about the size and shape of your heart and how well your heart's chambers and valves are working. This procedure takes approximately one hour. There are no restrictions for this procedure.  Your physician recommends that you schedule a follow-up appointment in: 1 month with Dr Aundra Dubin.

## 2013-12-25 NOTE — Progress Notes (Signed)
Patient ID: Billy Bush, male   DOB: Aug 26, 1941, 73 y.o.   MRN: [id number] PCP: Dr. Loanne Drilling  73 yo with history of CAD s/p CABG, ischemic cardiomyopathy, and esophageal cancer presents for cardiology followup.  He was diagnosed in 2014 with a high grade neuroendocrine esophageal cancer (had dysphagia) and has undergone chemotherapy and radiation.  He has completed radiation, now getting monthly carboplatin and etoposide.  He is going for his last round of chemotherapy next week.  Dysphagia has resolved.  Of note, most recent CBC showed fall in platelets to 47,000 as well as worse anemia.  He was transfused. Last echo was in 11/12 and showed EF 30-35%.  His last cardiac cath was in 2008.  At that time, his bypass grafts were patent but he had Cypher DES to left main.    No chest pain.  No exertional dyspnea.  He can climb a flight of steps with no problems.  No orthopnea or PND.  No syncope or lightheadedness.  His appetite is better now and weight is up 16 lbs since last appointment.  He also has noted increased ankle swelling over the last couple of weeks. No melena or hematochezia.  At last appointment, I replaced his ramipril with losartan due to cough.  Blood pressure fell so I cut back on losartan to 25 mg daily which he has tolerated.   ECG; NSR, inferolateral T wave inversions, old ASMI, QRS 116 msec  Labs (2/14): LDL 81, HDL 43 Labs (9/14): K 3.7, creatinine 0.9 Labs (10/14): hemoglobin 8.3, plts 173 Labs (1/15): HCT 21.2, platelets 47, K 4.0, creatinine 0.79  PMH: 1. Type II diabetes 2. CAD: s/p CABG 1997.  Last LHC in 2008 showed 80% distal LM, patent LIMA-LAD, patent SVG-OM, patent SVG-PDA.  Patient had Cypher DES to left main.  3. Ischemic cardiomyopathy: Patient has a [employer], political party ICD.  Last echo in 11/12 showed EF 30-35% with severe LV dilation, EF 30-35%, apical and basal inferior akinesis, moderate MR, mild AI.  4. 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 Now getting monthly carboplatin and etoposide.  5. Hyperlipidemia  SH: Married, retired [employer], prior smoker, lives in [place].  FH: No premature CAD.   ROS: All systems reviewed and negative except as per HPI.   Current Outpatient Prescriptions  Medication Sig Dispense Refill  . aspirin EC 81 MG tablet Take 1 tablet (81 mg total) by mouth daily.      Marland Kitchen atorvastatin (LIPITOR) 80 MG tablet Take 1 tablet by mouth at  bedtime  90 tablet  0  . carvedilol (COREG) 12.5 MG tablet 2 (two) times daily with a meal.       . citalopram (CELEXA) 10 MG tablet Take 1 tablet (10 mg total) by mouth daily.  90 tablet  0  . co-enzyme Q-10 30 MG capsule Take 30 mg by mouth daily.        Marland Kitchen glucose blood (ONE TOUCH ULTRA TEST) test strip Use as directed twice daily dx 250.01  200 each  3  . ibuprofen (ADVIL,MOTRIN) 200 MG tablet Take 200 mg by mouth every 8 (eight) hours as needed for pain.      Marland Kitchen insulin lispro (HUMALOG) 100 UNIT/ML injection Inject into the skin 3x a day (just before each meal) 04-30-13 units      . Insulin Pen Needle (BD PEN NEEDLE NANO U/F) 32G X 4 MM MISC Use as directed four times  daily  400 each  3  . losartan (COZAAR) 50 MG tablet 1/2 tablet (total 25mg ) daily      . Multiple Vitamin (MULTIVITAMIN) tablet Take 1 tablet by mouth daily.        . nitroGLYCERIN (NITROSTAT) 0.4 MG SL tablet Place 1 tablet (0.4 mg total) under the tongue every 5 (five) minutes as needed for chest pain.  25 tablet  3  . prochlorperazine (COMPAZINE) 10 MG tablet Take 10 mg by mouth every 6 (six) hours as needed for nausea or vomiting.      Marland Kitchen spironolactone (ALDACTONE) 25 MG tablet 1 tablet daily  90 tablet  1  . furosemide (LASIX) 80 MG tablet 1 tablet (80 mg)in the AM and 1/2 tablet(40mg ) in the PM  45 tablet  3  . potassium chloride SA (K-DUR,KLOR-CON) 20 MEQ tablet Take 1 tablet (20 mEq total) by mouth daily.  30 tablet  3   No current  facility-administered medications for this visit.    BP 100/60  Pulse 68  Ht 5\' 7"  (1.702 m)  Wt 79.833 kg (176 lb)  BMI 27.56 kg/m2 General: NAD Neck: JVP 8 cm, no thyromegaly or thyroid nodule.  Lungs: Clear to auscultation bilaterally with normal respiratory effort. CV: Nondisplaced PMI.  Heart regular S1/S2, no S3/S4, 3/6 HSM at apex.  2+ edema 1/2 up lower legs bilaterally.  No carotid bruit.  Normal pedal pulses.  Abdomen: Soft, nontender, no hepatosplenomegaly, no distention.  Neurologic: Alert and oriented x 3.  Psych: Normal affect. Extremities: No clubbing or cyanosis.   Assessment/Plan: 1. CAD: s/p CABG and later Cypher DES to left main in 2008.  He has been on ASA and Plavix chronically.  I have planned to continue Plavix long-term given 1st generation DES and no bleeding problems.  Given current thrombocytopenia in the setting of chemotheraphy (47,000 most recently), I am going to have him stop Plavix for now.  He has only 1 more round of chemo then will be finished.  I will check a CBC today and then again in a month.  If platelets have recovered in a month, I will restart him on Plavix.  For now, continue ASA 81 unless platelets fall further.  Continue statin, Coreg, ramipril. 2. Chronic systolic CHF: Patient looks volume overloaded today and weight is up considerably.  Some of this is likely due to a better appetite.  He has NYHA class II symptoms.  EF 30-35% on last echo. QRS is not significantly prolonged.  He will continue current doses of Coreg, losartan, and spironolactone (no changes given soft blood pressure). I will  Increase Lasix today to 80 qam, 40 qpm with BMET/BNP in 10 days.  I will start KCl 20 mEq daily.  3. Hyperlipidemia: Check lipids with labs in 10 days.  4. Murmur: Patient's MR murmur seems louder.  I am going to get an echocardiogram to assess.   Loralie Champagne 12/25/2013 1:59 PM

## 2013-12-28 ENCOUNTER — Other Ambulatory Visit: Payer: Self-pay | Admitting: Oncology

## 2013-12-29 ENCOUNTER — Other Ambulatory Visit (HOSPITAL_BASED_OUTPATIENT_CLINIC_OR_DEPARTMENT_OTHER): Payer: Medicare HMO

## 2013-12-29 ENCOUNTER — Ambulatory Visit (HOSPITAL_BASED_OUTPATIENT_CLINIC_OR_DEPARTMENT_OTHER): Payer: Medicare HMO

## 2013-12-29 ENCOUNTER — Ambulatory Visit (HOSPITAL_BASED_OUTPATIENT_CLINIC_OR_DEPARTMENT_OTHER): Payer: Medicare HMO | Admitting: Oncology

## 2013-12-29 ENCOUNTER — Telehealth: Payer: Self-pay | Admitting: Oncology

## 2013-12-29 VITALS — BP 96/55 | HR 71 | Temp 96.6°F | Resp 18 | Ht 67.0 in | Wt 175.0 lb

## 2013-12-29 DIAGNOSIS — Z5111 Encounter for antineoplastic chemotherapy: Secondary | ICD-10-CM

## 2013-12-29 DIAGNOSIS — C155 Malignant neoplasm of lower third of esophagus: Secondary | ICD-10-CM

## 2013-12-29 DIAGNOSIS — C7A1 Malignant poorly differentiated neuroendocrine tumors: Secondary | ICD-10-CM

## 2013-12-29 DIAGNOSIS — I959 Hypotension, unspecified: Secondary | ICD-10-CM

## 2013-12-29 DIAGNOSIS — C159 Malignant neoplasm of esophagus, unspecified: Secondary | ICD-10-CM

## 2013-12-29 DIAGNOSIS — D649 Anemia, unspecified: Secondary | ICD-10-CM

## 2013-12-29 DIAGNOSIS — C7B8 Other secondary neuroendocrine tumors: Secondary | ICD-10-CM

## 2013-12-29 DIAGNOSIS — D6481 Anemia due to antineoplastic chemotherapy: Secondary | ICD-10-CM

## 2013-12-29 DIAGNOSIS — I509 Heart failure, unspecified: Secondary | ICD-10-CM

## 2013-12-29 DIAGNOSIS — T451X5A Adverse effect of antineoplastic and immunosuppressive drugs, initial encounter: Secondary | ICD-10-CM

## 2013-12-29 LAB — CBC WITH DIFFERENTIAL/PLATELET
BASO%: 0.1 % (ref 0.0–2.0)
Basophils Absolute: 0 10*3/uL (ref 0.0–0.1)
EOS%: 0.7 % (ref 0.0–7.0)
Eosinophils Absolute: 0.1 10*3/uL (ref 0.0–0.5)
HCT: 25.6 % — ABNORMAL LOW (ref 38.4–49.9)
HGB: 8.1 g/dL — ABNORMAL LOW (ref 13.0–17.1)
LYMPH%: 6.6 % — AB (ref 14.0–49.0)
MCH: 31.8 pg (ref 27.2–33.4)
MCHC: 31.6 g/dL — AB (ref 32.0–36.0)
MCV: 100.4 fL — AB (ref 79.3–98.0)
MONO#: 0.7 10*3/uL (ref 0.1–0.9)
MONO%: 7.4 % (ref 0.0–14.0)
NEUT#: 7.5 10*3/uL — ABNORMAL HIGH (ref 1.5–6.5)
NEUT%: 85.2 % — AB (ref 39.0–75.0)
PLATELETS: 134 10*3/uL — AB (ref 140–400)
RBC: 2.55 10*6/uL — ABNORMAL LOW (ref 4.20–5.82)
RDW: 20.1 % — ABNORMAL HIGH (ref 11.0–14.6)
WBC: 8.8 10*3/uL (ref 4.0–10.3)
lymph#: 0.6 10*3/uL — ABNORMAL LOW (ref 0.9–3.3)

## 2013-12-29 LAB — COMPREHENSIVE METABOLIC PANEL (CC13)
ALK PHOS: 103 U/L (ref 40–150)
ALT: 11 U/L (ref 0–55)
AST: 19 U/L (ref 5–34)
Albumin: 3.3 g/dL — ABNORMAL LOW (ref 3.5–5.0)
Anion Gap: 7 mEq/L (ref 3–11)
BILIRUBIN TOTAL: 1.09 mg/dL (ref 0.20–1.20)
BUN: 19.3 mg/dL (ref 7.0–26.0)
CO2: 27 meq/L (ref 22–29)
Calcium: 9.1 mg/dL (ref 8.4–10.4)
Chloride: 102 mEq/L (ref 98–109)
Creatinine: 0.9 mg/dL (ref 0.7–1.3)
Glucose: 120 mg/dl (ref 70–140)
Potassium: 4.1 mEq/L (ref 3.5–5.1)
SODIUM: 136 meq/L (ref 136–145)
TOTAL PROTEIN: 6.1 g/dL — AB (ref 6.4–8.3)

## 2013-12-29 MED ORDER — DEXAMETHASONE SODIUM PHOSPHATE 20 MG/5ML IJ SOLN
INTRAMUSCULAR | Status: AC
  Filled 2013-12-29: qty 5

## 2013-12-29 MED ORDER — SODIUM CHLORIDE 0.9 % IV SOLN
284.7000 mg | Freq: Once | INTRAVENOUS | Status: AC
  Administered 2013-12-29: 280 mg via INTRAVENOUS
  Filled 2013-12-29: qty 28

## 2013-12-29 MED ORDER — ONDANSETRON 16 MG/50ML IVPB (CHCC)
INTRAVENOUS | Status: AC
  Filled 2013-12-29: qty 16

## 2013-12-29 MED ORDER — SODIUM CHLORIDE 0.9 % IV SOLN
65.0000 mg/m2 | Freq: Once | INTRAVENOUS | Status: AC
  Administered 2013-12-29: 120 mg via INTRAVENOUS
  Filled 2013-12-29: qty 6

## 2013-12-29 MED ORDER — DEXAMETHASONE SODIUM PHOSPHATE 20 MG/5ML IJ SOLN
20.0000 mg | Freq: Once | INTRAMUSCULAR | Status: AC
  Administered 2013-12-29: 20 mg via INTRAVENOUS

## 2013-12-29 MED ORDER — SODIUM CHLORIDE 0.9 % IV SOLN
Freq: Once | INTRAVENOUS | Status: AC
  Administered 2013-12-29: 12:00:00 via INTRAVENOUS

## 2013-12-29 MED ORDER — ONDANSETRON 16 MG/50ML IVPB (CHCC)
16.0000 mg | Freq: Once | INTRAVENOUS | Status: AC
  Administered 2013-12-29: 16 mg via INTRAVENOUS

## 2013-12-29 NOTE — Progress Notes (Signed)
   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 He completed a third cycle of etoposide/carboplatin 12/02/2013 with Neulasta support. He tolerated the chemotherapy well. He denies nausea/vomiting and mouth sores. No dysphagia. Good appetite.  On 12/11/2013 the hemoglobin returned at 6.8. He was transfused with 2 units of packed red blood cells 12/11/2013. He reports feeling better after the red cell transfusion.  Objective:  Vital signs in last 24 hours:  Blood pressure 96/55, pulse 71, temperature 96.6 F (35.9 C), temperature source Oral, resp. rate 18, height 5\' 7"  (1.702 m), weight 175 lb (79.379 kg).    HEENT: No thrush or ulcers Resp: Lungs clear bilaterally Cardio: Regular rate and rhythm GI: No hepatomegaly, nontender Vascular: Pitting edema below the knee bilaterally   Lab Results:  Lab Results  Component Value Date   WBC 8.8 12/29/2013   HGB 8.1* 12/29/2013   HCT 25.6* 12/29/2013   MCV 100.4* 12/29/2013   PLT 134* 12/29/2013   NEUTROABS 7.5* 12/29/2013      Medications: I have reviewed the patient's current medications.  Assessment/Plan: 1. High-grade neuroendocrine carcinoma of the esophagus, clinical stage III. Staging PET scan 07/23/2013 showed a large distal esophageal mass with neoplastic range FDG uptake, metastatic upper abdominal lymphadenopathy, indeterminate small pulmonary nodules. -initiation of radiation on 08/06/2013. Radiation completed 09/12/2013.  -Initiation of weekly carboplatin 08/07/2013. Final weekly carboplatin 08/28/2013.  -Restaging CT of the chest, abdomen, and pelvis on 09/25/2013: Decreased esophagus mass, decreased upper abdomen adenopathy. No new or progressive disease per  -Cycle 1 etoposide/carboplatin 10/06/2013.  -Cycle 2 carboplatin/etoposide beginning 11/03/2013. Dose reduction of the carboplatin and etoposide with cycle 2 due  to hematologic toxicity following cycle 1.  -Cycle 3 etoposide/carboplatin 12/02/2013 2. Dysphagia secondary to #1. Resolved. 3. History of coronary artery disease. 4. Congestive heart failure. 5. Diabetes. 6. History of Weight loss. He is gaining weight. 7. Mild hypotension.  8. 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 He required a red cell transfusion following cycle 3 etoposide/carboplatin 9. History of Thrombocytopenia secondary to chemotherapy. 10. Hospitalization 10/20/2013 following a fall    Disposition:  Billy Bush has completed 3 cycles of etoposide/carboplatin chemotherapy. The plan is to proceed with cycle 4 today. He appears to be tolerating the chemotherapy well and there is no clinical evidence for progression of the neuroendocrine tumor.  He will return for a nadir CBC 01/06/2014. Billy Bush will return for an office visit on 01/26/2014. We will discuss the indication for a restaging CT when he is here on 01/26/2014.   Betsy Coder, MD  12/29/2013  3:47 PM

## 2013-12-29 NOTE — Patient Instructions (Signed)
[facility] Discharge Instructions for Patients Receiving Chemotherapy  Today you received the following chemotherapy agents:  Carboplatin and Etoposide.  To help prevent nausea and vomiting after your treatment, we encourage you to take your nausea medication as ordered per MD.   If you develop nausea and vomiting that is not controlled by your nausea medication, call the clinic.   BELOW ARE SYMPTOMS THAT SHOULD BE REPORTED IMMEDIATELY:  *FEVER GREATER THAN 100.5 F  *CHILLS WITH OR WITHOUT FEVER  NAUSEA AND VOMITING THAT IS NOT CONTROLLED WITH YOUR NAUSEA MEDICATION  *UNUSUAL SHORTNESS OF BREATH  *UNUSUAL BRUISING OR BLEEDING  TENDERNESS IN MOUTH AND THROAT WITH OR WITHOUT PRESENCE OF ULCERS  *URINARY PROBLEMS  *BOWEL PROBLEMS  UNUSUAL RASH Items with * indicate a potential emergency and should be followed up as soon as possible.  Feel free to call the clinic you have any questions or concerns. The clinic phone number is [phone].    

## 2013-12-29 NOTE — Telephone Encounter (Signed)
gv adn printed appt sched and avs forpt for Feb and March °

## 2013-12-30 ENCOUNTER — Ambulatory Visit (HOSPITAL_BASED_OUTPATIENT_CLINIC_OR_DEPARTMENT_OTHER): Payer: Medicare HMO

## 2013-12-30 VITALS — BP 93/43 | HR 68 | Temp 97.7°F | Resp 18

## 2013-12-30 DIAGNOSIS — C159 Malignant neoplasm of esophagus, unspecified: Secondary | ICD-10-CM

## 2013-12-30 DIAGNOSIS — C7B8 Other secondary neuroendocrine tumors: Secondary | ICD-10-CM

## 2013-12-30 DIAGNOSIS — C7A1 Malignant poorly differentiated neuroendocrine tumors: Secondary | ICD-10-CM

## 2013-12-30 DIAGNOSIS — C155 Malignant neoplasm of lower third of esophagus: Secondary | ICD-10-CM

## 2013-12-30 DIAGNOSIS — Z5111 Encounter for antineoplastic chemotherapy: Secondary | ICD-10-CM

## 2013-12-30 MED ORDER — ONDANSETRON 8 MG/50ML IVPB (CHCC)
8.0000 mg | Freq: Once | INTRAVENOUS | Status: AC
  Administered 2013-12-30: 8 mg via INTRAVENOUS

## 2013-12-30 MED ORDER — SODIUM CHLORIDE 0.9 % IV SOLN
Freq: Once | INTRAVENOUS | Status: AC
  Administered 2013-12-30: 10:00:00 via INTRAVENOUS

## 2013-12-30 MED ORDER — ONDANSETRON 8 MG/NS 50 ML IVPB
INTRAVENOUS | Status: AC
  Filled 2013-12-30: qty 8

## 2013-12-30 MED ORDER — ETOPOSIDE CHEMO INJECTION 1 GM/50ML
65.0000 mg/m2 | Freq: Once | INTRAVENOUS | Status: AC
  Administered 2013-12-30: 120 mg via INTRAVENOUS
  Filled 2013-12-30: qty 6

## 2013-12-30 NOTE — Patient Instructions (Signed)
[facility] Discharge Instructions for Patients Receiving Chemotherapy  Today you received the following chemotherapy agents: Etoposide.  To help prevent nausea and vomiting after your treatment, we encourage you to take your nausea medication as prescribed.   If you develop nausea and vomiting that is not controlled by your nausea medication, call the clinic.   BELOW ARE SYMPTOMS THAT SHOULD BE REPORTED IMMEDIATELY:  *FEVER GREATER THAN 100.5 F  *CHILLS WITH OR WITHOUT FEVER  NAUSEA AND VOMITING THAT IS NOT CONTROLLED WITH YOUR NAUSEA MEDICATION  *UNUSUAL SHORTNESS OF BREATH  *UNUSUAL BRUISING OR BLEEDING  TENDERNESS IN MOUTH AND THROAT WITH OR WITHOUT PRESENCE OF ULCERS  *URINARY PROBLEMS  *BOWEL PROBLEMS  UNUSUAL RASH Items with * indicate a potential emergency and should be followed up as soon as possible.  Feel free to call the clinic you have any questions or concerns. The clinic phone number is [phone].    

## 2013-12-31 ENCOUNTER — Other Ambulatory Visit: Payer: Medicare Other

## 2013-12-31 ENCOUNTER — Ambulatory Visit (HOSPITAL_BASED_OUTPATIENT_CLINIC_OR_DEPARTMENT_OTHER): Payer: Medicare HMO

## 2013-12-31 ENCOUNTER — Ambulatory Visit: Payer: Medicare Other | Admitting: Cardiology

## 2013-12-31 VITALS — BP 88/53 | HR 67 | Temp 97.4°F | Resp 18

## 2013-12-31 DIAGNOSIS — C7B8 Other secondary neuroendocrine tumors: Secondary | ICD-10-CM

## 2013-12-31 DIAGNOSIS — C7A1 Malignant poorly differentiated neuroendocrine tumors: Secondary | ICD-10-CM

## 2013-12-31 DIAGNOSIS — Z5111 Encounter for antineoplastic chemotherapy: Secondary | ICD-10-CM

## 2013-12-31 DIAGNOSIS — C159 Malignant neoplasm of esophagus, unspecified: Secondary | ICD-10-CM

## 2013-12-31 DIAGNOSIS — C155 Malignant neoplasm of lower third of esophagus: Secondary | ICD-10-CM

## 2013-12-31 MED ORDER — ONDANSETRON 8 MG/NS 50 ML IVPB
INTRAVENOUS | Status: AC
  Filled 2013-12-31: qty 8

## 2013-12-31 MED ORDER — ONDANSETRON 8 MG/50ML IVPB (CHCC)
8.0000 mg | Freq: Once | INTRAVENOUS | Status: AC
  Administered 2013-12-31: 8 mg via INTRAVENOUS

## 2013-12-31 MED ORDER — SODIUM CHLORIDE 0.9 % IV SOLN
65.0000 mg/m2 | Freq: Once | INTRAVENOUS | Status: AC
  Administered 2013-12-31: 120 mg via INTRAVENOUS
  Filled 2013-12-31: qty 6

## 2013-12-31 MED ORDER — SODIUM CHLORIDE 0.9 % IV SOLN
Freq: Once | INTRAVENOUS | Status: AC
  Administered 2013-12-31: 12:00:00 via INTRAVENOUS

## 2013-12-31 NOTE — Patient Instructions (Signed)
[facility] Discharge Instructions for Patients Receiving Chemotherapy  Today you received the following chemotherapy agents:  etoposide  To help prevent nausea and vomiting after your treatment, we encourage you to take your nausea medication.  Take it as often as prescribed.     If you develop nausea and vomiting that is not controlled by your nausea medication, call the clinic. If it is after clinic hours your family physician or the after hours number for the clinic or go to the Emergency Department.   BELOW ARE SYMPTOMS THAT SHOULD BE REPORTED IMMEDIATELY:  *FEVER GREATER THAN 100.5 F  *CHILLS WITH OR WITHOUT FEVER  NAUSEA AND VOMITING THAT IS NOT CONTROLLED WITH YOUR NAUSEA MEDICATION  *UNUSUAL SHORTNESS OF BREATH  *UNUSUAL BRUISING OR BLEEDING  TENDERNESS IN MOUTH AND THROAT WITH OR WITHOUT PRESENCE OF ULCERS  *URINARY PROBLEMS  *BOWEL PROBLEMS  UNUSUAL RASH Items with * indicate a potential emergency and should be followed up as soon as possible.  Feel free to call the clinic you have any questions or concerns. The clinic phone number is [phone].   I have been informed and understand all the instructions given to me. I know to contact the clinic, my physician, or go to the Emergency Department if any problems should occur. I do not have any questions at this time, but understand that I may call the clinic during office hours   should I have any questions or need assistance in obtaining follow up care.    __________________________________________  _____________  __________ Signature of Patient or Authorized Representative            Date                   Time    __________________________________________ Nurse's Signature    

## 2013-12-31 NOTE — Progress Notes (Signed)
OK to treat with lab values of 2/2 per Dr. Gearldine Shown office note of 12/29/12.

## 2014-01-01 ENCOUNTER — Ambulatory Visit (HOSPITAL_BASED_OUTPATIENT_CLINIC_OR_DEPARTMENT_OTHER): Payer: Medicare HMO

## 2014-01-01 VITALS — BP 102/39 | HR 68 | Temp 98.0°F

## 2014-01-01 DIAGNOSIS — C7A1 Malignant poorly differentiated neuroendocrine tumors: Secondary | ICD-10-CM

## 2014-01-01 DIAGNOSIS — C155 Malignant neoplasm of lower third of esophagus: Secondary | ICD-10-CM

## 2014-01-01 DIAGNOSIS — C7B8 Other secondary neuroendocrine tumors: Secondary | ICD-10-CM

## 2014-01-01 DIAGNOSIS — Z5189 Encounter for other specified aftercare: Secondary | ICD-10-CM

## 2014-01-01 DIAGNOSIS — C159 Malignant neoplasm of esophagus, unspecified: Secondary | ICD-10-CM

## 2014-01-01 MED ORDER — PEGFILGRASTIM INJECTION 6 MG/0.6ML
6.0000 mg | Freq: Once | SUBCUTANEOUS | Status: AC
  Administered 2014-01-01: 6 mg via SUBCUTANEOUS
  Filled 2014-01-01: qty 0.6

## 2014-01-02 ENCOUNTER — Ambulatory Visit (INDEPENDENT_AMBULATORY_CARE_PROVIDER_SITE_OTHER): Payer: Medicare HMO | Admitting: *Deleted

## 2014-01-02 DIAGNOSIS — Z95810 Presence of automatic (implantable) cardiac defibrillator: Secondary | ICD-10-CM

## 2014-01-02 DIAGNOSIS — I5022 Chronic systolic (congestive) heart failure: Secondary | ICD-10-CM

## 2014-01-02 LAB — MDC_IDC_ENUM_SESS_TYPE_INCLINIC
Battery Remaining Longevity: 51.6 mo
Lead Channel Pacing Threshold Amplitude: 0.5 V
Lead Channel Pacing Threshold Amplitude: 0.5 V
Lead Channel Pacing Threshold Pulse Width: 0.5 ms
Lead Channel Setting Pacing Amplitude: 2.5 V
Lead Channel Setting Pacing Pulse Width: 0.5 ms
Lead Channel Setting Sensing Sensitivity: 0.3 mV
MDC IDC MSMT BATTERY VOLTAGE: 2.57 V
MDC IDC MSMT LEADCHNL RV IMPEDANCE VALUE: 325 Ohm
MDC IDC MSMT LEADCHNL RV PACING THRESHOLD PULSEWIDTH: 0.5 ms
MDC IDC MSMT LEADCHNL RV SENSING INTR AMPL: 8.7 mV
MDC IDC PG SERIAL: 484050
MDC IDC SESS DTM: 20150206103232
MDC IDC SET ZONE DETECTION INTERVAL: 300 ms
MDC IDC STAT BRADY RV PERCENT PACED: 0.02 %
Zone Setting Detection Interval: 250 ms
Zone Setting Detection Interval: 340 ms

## 2014-01-02 NOTE — Progress Notes (Signed)
ICD check in clinic. Normal device function. Thresholds and sensing consistent with previous device measurements. Impedance trends stable over time. No evidence of any ventricular arrhythmias. Histogram distribution appropriate for patient and level of activity. No changes made this session. Device programmed at appropriate safety margins. Device programmed to optimize intrinsic conduction. Estimated longevity 4.102yrs. Alert vibration demonstrated for patient, pt knows to call if felt.   ROV w/ device clinic 04/02/14 @ 9:30.

## 2014-01-05 ENCOUNTER — Telehealth: Payer: Self-pay | Admitting: *Deleted

## 2014-01-05 ENCOUNTER — Other Ambulatory Visit: Payer: Self-pay | Admitting: *Deleted

## 2014-01-05 ENCOUNTER — Ambulatory Visit (INDEPENDENT_AMBULATORY_CARE_PROVIDER_SITE_OTHER): Payer: Medicare HMO | Admitting: *Deleted

## 2014-01-05 DIAGNOSIS — C159 Malignant neoplasm of esophagus, unspecified: Secondary | ICD-10-CM

## 2014-01-05 DIAGNOSIS — D649 Anemia, unspecified: Secondary | ICD-10-CM

## 2014-01-05 DIAGNOSIS — I5022 Chronic systolic (congestive) heart failure: Secondary | ICD-10-CM

## 2014-01-05 DIAGNOSIS — I2589 Other forms of chronic ischemic heart disease: Secondary | ICD-10-CM

## 2014-01-05 LAB — BASIC METABOLIC PANEL
BUN: 23 mg/dL (ref 6–23)
CHLORIDE: 103 meq/L (ref 96–112)
CO2: 25 meq/L (ref 19–32)
Calcium: 8.2 mg/dL — ABNORMAL LOW (ref 8.4–10.5)
Creatinine, Ser: 0.9 mg/dL (ref 0.4–1.5)
GFR: 90.92 mL/min (ref >60.00)
GLUCOSE: 186 mg/dL — AB (ref 70–99)
POTASSIUM: 4.1 meq/L (ref 3.5–5.1)
SODIUM: 134 meq/L — AB (ref 135–145)

## 2014-01-05 LAB — CBC WITH DIFFERENTIAL/PLATELET
BASOS PCT: 0.3 % (ref 0.0–3.0)
Basophils Absolute: 0 10*3/uL (ref 0.0–0.1)
Eosinophils Absolute: 0.1 10*3/uL (ref 0.0–0.7)
Eosinophils Relative: 1.2 % (ref 0.0–5.0)
HCT: 21.2 % — CL (ref 39.0–52.0)
Hemoglobin: 7.1 g/dL — CL (ref 13.0–17.0)
LYMPHS PCT: 4.7 % — AB (ref 12.0–46.0)
Lymphs Abs: 0.3 10*3/uL — ABNORMAL LOW (ref 0.7–4.0)
MCHC: 33.5 g/dL (ref 30.0–36.0)
MCV: 101.7 fl — ABNORMAL HIGH (ref 78.0–100.0)
MONO ABS: 0.5 10*3/uL (ref 0.1–1.0)
MONOS PCT: 6.3 % (ref 3.0–12.0)
Neutro Abs: 6.4 10*3/uL (ref 1.4–7.7)
Neutrophils Relative %: 87.5 % — ABNORMAL HIGH (ref 43.0–77.0)
PLATELETS: 98 10*3/uL — AB (ref 150.0–400.0)
RBC: 2.08 Mil/uL — ABNORMAL LOW (ref 4.22–5.81)
RDW: 21 % — ABNORMAL HIGH (ref 11.5–14.6)
WBC: 7.3 10*3/uL (ref 4.5–10.5)

## 2014-01-05 LAB — BRAIN NATRIURETIC PEPTIDE: Pro B Natriuretic peptide (BNP): 390 pg/mL — ABNORMAL HIGH (ref 0.0–100.0)

## 2014-01-05 NOTE — Telephone Encounter (Signed)
Left VM for Dayshon that we will setting him up for transfusion this week.

## 2014-01-05 NOTE — Telephone Encounter (Signed)
VM from Dr. Hillery Jacks office that Hgb today is 7.1-messaged received at 1630 by this RN. Notified Dr. Zollie Pee get him in for transfusion this week-1st available. POF to scheduler.

## 2014-01-06 ENCOUNTER — Other Ambulatory Visit: Payer: Self-pay | Admitting: Hematology and Oncology

## 2014-01-06 ENCOUNTER — Other Ambulatory Visit (HOSPITAL_BASED_OUTPATIENT_CLINIC_OR_DEPARTMENT_OTHER): Payer: Medicare HMO

## 2014-01-06 ENCOUNTER — Other Ambulatory Visit: Payer: Self-pay | Admitting: *Deleted

## 2014-01-06 ENCOUNTER — Non-Acute Institutional Stay (HOSPITAL_COMMUNITY)
Admission: AD | Admit: 2014-01-06 | Discharge: 2014-01-06 | Disposition: A | Discharge status: Home / Self Care | Payer: Medicare HMO | Source: Ambulatory Visit | Attending: Oncology | Admitting: Oncology

## 2014-01-06 ENCOUNTER — Ambulatory Visit (HOSPITAL_COMMUNITY)
Admission: RE | Admit: 2014-01-06 | Discharge: 2014-01-06 | Disposition: A | Discharge status: Home / Self Care | Payer: Medicare HMO | Source: Ambulatory Visit | Attending: Oncology | Admitting: Oncology

## 2014-01-06 DIAGNOSIS — D649 Anemia, unspecified: Secondary | ICD-10-CM

## 2014-01-06 DIAGNOSIS — I509 Heart failure, unspecified: Secondary | ICD-10-CM

## 2014-01-06 DIAGNOSIS — Z0279 Encounter for issue of other medical certificate: Secondary | ICD-10-CM

## 2014-01-06 LAB — CBC WITH DIFFERENTIAL/PLATELET
BASO%: 0.2 % (ref 0.0–2.0)
Basophils Absolute: 0 10*3/uL (ref 0.0–0.1)
EOS ABS: 0.1 10*3/uL (ref 0.0–0.5)
EOS%: 1.7 % (ref 0.0–7.0)
HCT: 21.2 % — ABNORMAL LOW (ref 38.4–49.9)
HGB: 6.8 g/dL — CL (ref 13.0–17.1)
LYMPH#: 0.4 10*3/uL — AB (ref 0.9–3.3)
LYMPH%: 5.8 % — AB (ref 14.0–49.0)
MCH: 32.4 pg (ref 27.2–33.4)
MCHC: 32.1 g/dL (ref 32.0–36.0)
MCV: 101 fL — ABNORMAL HIGH (ref 79.3–98.0)
MONO#: 0.5 10*3/uL (ref 0.1–0.9)
MONO%: 7 % (ref 0.0–14.0)
NEUT%: 85.3 % — ABNORMAL HIGH (ref 39.0–75.0)
NEUTROS ABS: 5.6 10*3/uL (ref 1.5–6.5)
PLATELETS: 77 10*3/uL — AB (ref 140–400)
RBC: 2.1 10*6/uL — AB (ref 4.20–5.82)
RDW: 18.7 % — AB (ref 11.0–14.6)
WBC: 6.6 10*3/uL (ref 4.0–10.3)

## 2014-01-06 LAB — HOLD TUBE, BLOOD BANK

## 2014-01-06 LAB — PREPARE RBC (CROSSMATCH)

## 2014-01-06 MED ORDER — SODIUM CHLORIDE 0.9 % IV SOLN: 250.0000 mL | Freq: Once | INTRAVENOUS | Status: DC

## 2014-01-06 MED ORDER — FUROSEMIDE 10 MG/ML IJ SOLN
40.0000 mg | Freq: Once | INTRAMUSCULAR | Status: AC
  Administered 2014-01-06: 40 mg via INTRAVENOUS
  Filled 2014-01-06: qty 4

## 2014-01-06 NOTE — H&P (Signed)
Admitted for red cell transfusion.

## 2014-01-06 NOTE — Progress Notes (Signed)
Patient will be transfused today at 12:00 at [facility].

## 2014-01-06 NOTE — Progress Notes (Signed)
[facility]  Procedure Note  GLOVER CAPANO BTD:[id number] DOB: 1941/09/15 DOA: 01/06/2014   PCP: Renato Shin, MD   Associated Diagnosis: Anemia  Procedure Note: Transfused 2 Units PRBC, Lasix given between units.   Condition During Procedure: Patient tolerated well   Condition at Discharge: Patient tolerated well. No complaints, patient in no apparent distress. Patient left day hospital with belongings ambulatory escorted by family member. Instructed patient to contact medical attention or go to nearest emergency room if any abnormal changes in condition. Patient acknowledges.   Wendie Simmer, RN  [facility]

## 2014-01-06 NOTE — Discharge Instructions (Signed)
Anemia, Nonspecific Anemia is a condition in which the concentration of red blood cells or hemoglobin in the blood is below normal. Hemoglobin is a substance in red blood cells that carries oxygen to the tissues of the body. Anemia results in not enough oxygen reaching these tissues.  CAUSES  Common causes of anemia include:   Excessive bleeding. Bleeding may be internal or external. This includes excessive bleeding from periods (in women) or from the intestine.   Poor nutrition.   Chronic kidney, thyroid, and liver disease.  Bone marrow disorders that decrease red blood cell production.  Cancer and treatments for cancer.  HIV, AIDS, and their treatments.  Spleen problems that increase red blood cell destruction.  Blood disorders.  Excess destruction of red blood cells due to infection, medicines, and autoimmune disorders. SIGNS AND SYMPTOMS   Minor weakness.   Dizziness.   Headache.  Palpitations.   Shortness of breath, especially with exercise.   Paleness.  Cold sensitivity.  Indigestion.  Nausea.  Difficulty sleeping.  Difficulty concentrating. Symptoms may occur suddenly or they may develop slowly.  DIAGNOSIS  Additional blood tests are often needed. These help your health care provider determine the best treatment. Your health care provider will check your stool for blood and look for other causes of blood loss.  TREATMENT  Treatment varies depending on the cause of the anemia. Treatment can include:   Supplements of iron, vitamin 123456, or folic acid.   Hormone medicines.   A blood transfusion. This may be needed if blood loss is severe.   Hospitalization. This may be needed if there is significant continual blood loss.   Dietary changes.  Spleen removal. HOME CARE INSTRUCTIONS Keep all follow-up appointments. It often takes many weeks to correct anemia, and having your health care provider check on your condition and your response to  treatment is very important. SEEK IMMEDIATE MEDICAL CARE IF:   You develop extreme weakness, shortness of breath, or chest pain.   You become dizzy or have trouble concentrating.  You develop heavy vaginal bleeding.   You develop a rash.   You have bloody or black, tarry stools.   You faint.   You vomit up blood.   You vomit repeatedly.   You have abdominal pain.  You have a fever or persistent symptoms for more than 2 3 days.   You have a fever and your symptoms suddenly get worse.   You are dehydrated.  MAKE SURE YOU:  Understand these instructions.  Will watch your condition.  Will get help right away if you are not doing well or get worse. Document Released: 12/21/2004 Document Revised: 07/16/2013 Document Reviewed: 05/09/2013 [facility] Patient Information 2014 [place]. Heart Failure Heart failure is a condition in which the heart has trouble pumping blood. This means your heart does not pump blood efficiently for your body to work well. In some cases of heart failure, fluid may back up into your lungs or you may have swelling (edema) in your lower legs. Heart failure is usually a long-term (chronic) condition. It is important for you to take good care of yourself and follow your caregiver's treatment plan. CAUSES  Some health conditions can cause heart failure. Those health conditions include:  High blood pressure (hypertension) causes the heart muscle to work harder than normal. When pressure in the blood vessels is high, the heart needs to pump (contract) with more force in order to circulate blood throughout the body. High blood pressure eventually causes the heart  to become stiff and weak.  Coronary artery disease (CAD) is the buildup of cholesterol and fat (plaque) in the arteries of the heart. The blockage in the arteries deprives the heart muscle of oxygen and blood. This can cause chest pain and may lead to a heart attack. High blood pressure  can also contribute to CAD.  Heart attack (myocardial infarction) occurs when 1 or more arteries in the heart become blocked. The loss of oxygen damages the muscle tissue of the heart. When this happens, part of the heart muscle dies. The injured tissue does not contract as well and weakens the heart's ability to pump blood.  Abnormal heart valves can cause heart failure when the heart valves do not open and close properly. This makes the heart muscle pump harder to keep the blood flowing.  Heart muscle disease (cardiomyopathy or myocarditis) is damage to the heart muscle from a variety of causes. These can include drug or alcohol abuse, infections, or unknown reasons. These can increase the risk of heart failure.  Lung disease makes the heart work harder because the lungs do not work properly. This can cause a strain on the heart, leading it to fail.  Diabetes increases the risk of heart failure. High blood sugar contributes to high fat (lipid) levels in the blood. Diabetes can also cause slow damage to tiny blood vessels that carry important nutrients to the heart muscle. When the heart does not get enough oxygen and food, it can cause the heart to become weak and stiff. This leads to a heart that does not contract efficiently.  Other conditions can contribute to heart failure. These include abnormal heart rhythms, thyroid problems, and low blood counts (anemia). Certain unhealthy behaviors can increase the risk of heart failure. Those unhealthy behaviors include:  Being overweight.  Smoking or chewing tobacco.  Eating foods high in fat and cholesterol.  Abusing illicit drugs or alcohol.  Lacking physical activity. SYMPTOMS  Heart failure symptoms may vary and can be hard to detect. Symptoms may include:  Shortness of breath with activity, such as climbing stairs.  Persistent cough.  Swelling of the feet, ankles, legs, or abdomen.  Unexplained weight gain.  Difficulty breathing  when lying flat (orthopnea).  Waking from sleep because of the need to sit up and get more air.  Rapid heartbeat.  Fatigue and loss of energy.  Feeling lightheaded, dizzy, or close to fainting.  Loss of appetite.  Nausea.  Increased urination during the night (nocturia). DIAGNOSIS  A diagnosis of heart failure is based on your history, symptoms, physical examination, and diagnostic tests. Diagnostic tests for heart failure may include:  Echocardiography.  Electrocardiography.  Chest X-ray.  Blood tests.  Exercise stress test.  Cardiac angiography.  Radionuclide scans. TREATMENT  Treatment is aimed at managing the symptoms of heart failure. Medicines, behavioral changes, or surgical intervention may be necessary to treat heart failure.  Medicines to help treat heart failure may include:  Angiotensin-converting enzyme (ACE) inhibitors. This type of medicine blocks the effects of a blood protein called angiotensin-converting enzyme. ACE inhibitors relax (dilate) the blood vessels and help lower blood pressure.  Angiotensin receptor blockers. This type of medicine blocks the actions of a blood protein called angiotensin. Angiotensin receptor blockers dilate the blood vessels and help lower blood pressure.  Water pills (diuretics). Diuretics cause the kidneys to remove salt and water from the blood. The extra fluid is removed through urination. This loss of extra fluid lowers the volume of blood the  heart pumps.  Beta blockers. These prevent the heart from beating too fast and improve heart muscle strength.  Digitalis. This increases the force of the heartbeat.  Healthy behavior changes include:  Obtaining and maintaining a healthy weight.  Stopping smoking or chewing tobacco.  Eating heart healthy foods.  Limiting or avoiding alcohol.  Stopping illicit drug use.  Physical activity as directed by your caregiver.  Surgical treatment for heart failure may  include:  A procedure to open blocked arteries, repair damaged heart valves, or remove damaged heart muscle tissue.  A pacemaker to improve heart muscle function and control certain abnormal heart rhythms.  An internal cardioverter defibrillator to treat certain serious abnormal heart rhythms.  A left ventricular assist device to assist the pumping ability of the heart. HOME CARE INSTRUCTIONS   Take your medicine as directed by your caregiver. Medicines are important in reducing the workload of your heart, slowing the progression of heart failure, and improving your symptoms.  Do not stop taking your medicine unless directed by your caregiver.  Do not skip any dose of medicine.  Refill your prescriptions before you run out of medicine. Your medicines are needed every day.  Take over-the-counter medicine only as directed by your caregiver or pharmacist.  Engage in moderate physical activity if directed by your caregiver. Moderate physical activity can benefit some people. The elderly and people with severe heart failure should consult with a caregiver for physical activity recommendations.  Eat heart healthy foods. Food choices should be free of trans fat and low in saturated fat, cholesterol, and salt (sodium). Healthy choices include fresh or frozen fruits and vegetables, fish, lean meats, legumes, fat-free or low-fat dairy products, and whole grain or high fiber foods. Talk to a dietitian to learn more about heart healthy foods.  Limit sodium if directed by your caregiver. Sodium restriction may reduce symptoms of heart failure in some people. Talk to a dietitian to learn more about heart healthy seasonings.  Use healthy cooking methods. Healthy cooking methods include roasting, grilling, broiling, baking, poaching, steaming, or stir-frying. Talk to a dietitian to learn more about healthy cooking methods.  Limit fluids if directed by your caregiver. Fluid restriction may reduce  symptoms of heart failure in some people.  Weigh yourself every day. Daily weights are important in the early recognition of excess fluid. You should weigh yourself every morning after you urinate and before you eat breakfast. Wear the same amount of clothing each time you weigh yourself. Record your daily weight. Provide your caregiver with your weight record.  Monitor and record your blood pressure if directed by your caregiver.  Check your pulse if directed by your caregiver.  Lose weight if directed by your caregiver. Weight loss may reduce symptoms of heart failure in some people.  Stop smoking or chewing tobacco. Nicotine makes your heart work harder by causing your blood vessels to constrict. Do not use nicotine gum or patches before talking to your caregiver.  Schedule and attend follow-up visits as directed by your caregiver. It is important to keep all your appointments.  Limit alcohol intake to no more than 1 drink per day for nonpregnant women and 2 drinks per day for men. Drinking more than that is harmful to your heart. Tell your caregiver if you drink alcohol several times a week. Talk with your caregiver about whether alcohol is safe for you. If your heart has already been damaged by alcohol or you have severe heart failure, drinking alcohol should be  stopped completely.  Stop illicit drug use.  Stay up-to-date with immunizations. It is especially important to prevent respiratory infections through current pneumococcal and influenza immunizations.  Manage other health conditions such as hypertension, diabetes, thyroid disease, or abnormal heart rhythms as directed by your caregiver.  Learn to manage stress.  Plan rest periods when fatigued.  Learn strategies to manage high temperatures. If the weather is extremely hot:  Avoid vigorous physical activity.  Use air conditioning or fans or seek a cooler location.  Avoid caffeine and alcohol.  Wear loose-fitting,  lightweight, and light-colored clothing.  Learn strategies to manage cold temperatures. If the weather is extremely cold:  Avoid vigorous physical activity.  Layer clothes.  Wear mittens or gloves, a hat, and a scarf when going outside.  Avoid alcohol.  Obtain ongoing education and support as needed.  Participate or seek rehabilitation as needed to maintain or improve independence and quality of life. SEEK MEDICAL CARE IF:   Your weight increases by 03 lb/1.4 kg in 1 day or 05 lb/2.3 kg in a week.  You have increasing shortness of breath that is unusual for you.  You are unable to participate in your usual physical activities.  You tire easily.  You cough more than normal, especially with physical activity.  You have any or more swelling in areas such as your hands, feet, ankles, or abdomen.  You are unable to sleep because it is hard to breathe.  You feel like your heart is beating fast (palpitations).  You become dizzy or lightheaded upon standing up. SEEK IMMEDIATE MEDICAL CARE IF:   You have difficulty breathing.  There is a change in mental status such as decreased alertness or difficulty with concentration.  You have a pain or discomfort in your chest.  You have an episode of fainting (syncope). MAKE SURE YOU:   Understand these instructions.  Will watch your condition.  Will get help right away if you are not doing well or get worse. Document Released: 11/13/2005 Document Revised: 03/10/2013 Document Reviewed: 12/05/2012 [facility] Patient Information 2014 [place], [place].

## 2014-01-06 NOTE — Progress Notes (Signed)
Patient requesting to increase infusion rate of transfusion, per patient as 2 units blood usually run in over approximately 3 hrs. This RN clarified with Manuela Schwartz RN for Dr. Benay Spice; Per MD, patient is to have each unit infused  over 3 hrs due to CHF. This RN notified patient that the blood is being infused in over 3 hrs each Unit due to CHF. Patient acknowledged.

## 2014-01-06 NOTE — Progress Notes (Deleted)
Patient rating pain 5/10, appears to be resting comfortably. NP notified. NP acknowledged.

## 2014-01-07 ENCOUNTER — Other Ambulatory Visit: Payer: Self-pay | Admitting: *Deleted

## 2014-01-07 ENCOUNTER — Telehealth: Payer: Self-pay | Admitting: *Deleted

## 2014-01-07 ENCOUNTER — Telehealth: Payer: Self-pay | Admitting: Endocrinology

## 2014-01-07 DIAGNOSIS — C159 Malignant neoplasm of esophagus, unspecified: Secondary | ICD-10-CM

## 2014-01-07 LAB — TYPE AND SCREEN
ABO/RH(D): A POS
Antibody Screen: NEGATIVE
Unit division: 0
Unit division: 0

## 2014-01-07 NOTE — Telephone Encounter (Signed)
please call patient: i did form There is a part about all of the services and charges.  You would have this information rather than I. Please provide your medical bills in lieu of this part. If necessary, pt could call [employer] to request another copy of his bills.

## 2014-01-07 NOTE — Telephone Encounter (Signed)
Pt wife informed. States that she will pick up forms soon. Forms placed upfront.

## 2014-01-07 NOTE — Telephone Encounter (Signed)
Message copied by Tania Ade on Wed Jan 07, 2014  2:34 PM ------      Message from: Ladell Pier      Created: Tue Jan 06, 2014  7:45 PM       Please call patient, platelets are low, check cbc 2/13 or 2/16 ------

## 2014-01-07 NOTE — Telephone Encounter (Signed)
Notified son, Billy Bush of platelet count and to recheck 2/13 or 2/16. He requested we call patient's home to schedule. Called and spoke with Billy Bush-he is feeling much better since transfusion. Notified of lab and bleeding precautions and to call for any bleeding. He agrees to lab on Monday since he is out for ECHO that morning already. Scheduled at 10:15 2/16 with patient.

## 2014-01-12 ENCOUNTER — Other Ambulatory Visit (HOSPITAL_BASED_OUTPATIENT_CLINIC_OR_DEPARTMENT_OTHER): Payer: Medicare HMO

## 2014-01-12 ENCOUNTER — Encounter: Payer: Self-pay | Admitting: Cardiovascular Disease

## 2014-01-12 ENCOUNTER — Ambulatory Visit (HOSPITAL_COMMUNITY): Payer: Medicare HMO | Attending: Cardiovascular Disease | Admitting: Cardiology

## 2014-01-12 DIAGNOSIS — I359 Nonrheumatic aortic valve disorder, unspecified: Secondary | ICD-10-CM | POA: Insufficient documentation

## 2014-01-12 DIAGNOSIS — R011 Cardiac murmur, unspecified: Secondary | ICD-10-CM

## 2014-01-12 DIAGNOSIS — C159 Malignant neoplasm of esophagus, unspecified: Secondary | ICD-10-CM

## 2014-01-12 DIAGNOSIS — I5022 Chronic systolic (congestive) heart failure: Secondary | ICD-10-CM

## 2014-01-12 DIAGNOSIS — I379 Nonrheumatic pulmonary valve disorder, unspecified: Secondary | ICD-10-CM | POA: Insufficient documentation

## 2014-01-12 DIAGNOSIS — C7B8 Other secondary neuroendocrine tumors: Secondary | ICD-10-CM

## 2014-01-12 DIAGNOSIS — I2510 Atherosclerotic heart disease of native coronary artery without angina pectoris: Secondary | ICD-10-CM

## 2014-01-12 DIAGNOSIS — I079 Rheumatic tricuspid valve disease, unspecified: Secondary | ICD-10-CM | POA: Insufficient documentation

## 2014-01-12 DIAGNOSIS — I059 Rheumatic mitral valve disease, unspecified: Secondary | ICD-10-CM | POA: Insufficient documentation

## 2014-01-12 DIAGNOSIS — Z951 Presence of aortocoronary bypass graft: Secondary | ICD-10-CM | POA: Insufficient documentation

## 2014-01-12 DIAGNOSIS — I25810 Atherosclerosis of coronary artery bypass graft(s) without angina pectoris: Secondary | ICD-10-CM

## 2014-01-12 DIAGNOSIS — E119 Type 2 diabetes mellitus without complications: Secondary | ICD-10-CM | POA: Insufficient documentation

## 2014-01-12 DIAGNOSIS — I509 Heart failure, unspecified: Secondary | ICD-10-CM | POA: Insufficient documentation

## 2014-01-12 DIAGNOSIS — C7A1 Malignant poorly differentiated neuroendocrine tumors: Secondary | ICD-10-CM

## 2014-01-12 DIAGNOSIS — E785 Hyperlipidemia, unspecified: Secondary | ICD-10-CM | POA: Insufficient documentation

## 2014-01-12 DIAGNOSIS — I2589 Other forms of chronic ischemic heart disease: Secondary | ICD-10-CM

## 2014-01-12 LAB — CBC WITH DIFFERENTIAL/PLATELET
BASO%: 0.2 % (ref 0.0–2.0)
BASOS ABS: 0 10*3/uL (ref 0.0–0.1)
EOS ABS: 0.1 10*3/uL (ref 0.0–0.5)
EOS%: 1 % (ref 0.0–7.0)
HEMATOCRIT: 27.7 % — AB (ref 38.4–49.9)
HGB: 9.1 g/dL — ABNORMAL LOW (ref 13.0–17.1)
LYMPH%: 4 % — ABNORMAL LOW (ref 14.0–49.0)
MCH: 33.6 pg — ABNORMAL HIGH (ref 27.2–33.4)
MCHC: 32.9 g/dL (ref 32.0–36.0)
MCV: 102 fL — ABNORMAL HIGH (ref 79.3–98.0)
MONO#: 0.6 10*3/uL (ref 0.1–0.9)
MONO%: 8.4 % (ref 0.0–14.0)
NEUT%: 86.4 % — AB (ref 39.0–75.0)
NEUTROS ABS: 6 10*3/uL (ref 1.5–6.5)
PLATELETS: 75 10*3/uL — AB (ref 140–400)
RBC: 2.72 10*6/uL — AB (ref 4.20–5.82)
RDW: 20.4 % — ABNORMAL HIGH (ref 11.0–14.6)
WBC: 6.9 10*3/uL (ref 4.0–10.3)
lymph#: 0.3 10*3/uL — ABNORMAL LOW (ref 0.9–3.3)

## 2014-01-12 NOTE — Progress Notes (Signed)
Echo performed. 

## 2014-01-13 ENCOUNTER — Other Ambulatory Visit: Payer: Self-pay | Admitting: *Deleted

## 2014-01-13 DIAGNOSIS — C159 Malignant neoplasm of esophagus, unspecified: Secondary | ICD-10-CM

## 2014-01-14 ENCOUNTER — Telehealth: Payer: Self-pay | Admitting: Oncology

## 2014-01-14 NOTE — Telephone Encounter (Signed)
lvm for pt regarding to March appt....mailed pt appt sched/avs and letter °

## 2014-01-15 ENCOUNTER — Ambulatory Visit (INDEPENDENT_AMBULATORY_CARE_PROVIDER_SITE_OTHER): Payer: Medicare HMO | Admitting: Cardiology

## 2014-01-15 ENCOUNTER — Encounter: Payer: Self-pay | Admitting: Cardiology

## 2014-01-15 VITALS — BP 110/68 | HR 65 | Ht 67.0 in | Wt 169.1 lb

## 2014-01-15 DIAGNOSIS — D696 Thrombocytopenia, unspecified: Secondary | ICD-10-CM

## 2014-01-15 DIAGNOSIS — I5022 Chronic systolic (congestive) heart failure: Secondary | ICD-10-CM

## 2014-01-15 DIAGNOSIS — I2510 Atherosclerotic heart disease of native coronary artery without angina pectoris: Secondary | ICD-10-CM

## 2014-01-15 LAB — BASIC METABOLIC PANEL
BUN: 27 mg/dL — AB (ref 6–23)
CO2: 28 meq/L (ref 19–32)
CREATININE: 1.1 mg/dL (ref 0.4–1.5)
Calcium: 8.7 mg/dL (ref 8.4–10.5)
Chloride: 93 mEq/L — ABNORMAL LOW (ref 96–112)
GFR: 71.77 mL/min (ref >60.00)
GLUCOSE: 463 mg/dL — AB (ref 70–99)
Potassium: 5.1 mEq/L (ref 3.5–5.1)
SODIUM: 128 meq/L — AB (ref 135–145)

## 2014-01-15 LAB — BRAIN NATRIURETIC PEPTIDE: Pro B Natriuretic peptide (BNP): 292 pg/mL — ABNORMAL HIGH (ref 0.0–100.0)

## 2014-01-15 NOTE — Patient Instructions (Addendum)
Your physician recommends that you schedule a follow-up appointment in:  3 months. --Apr 17, 2014 at 8:00

## 2014-01-15 NOTE — Progress Notes (Signed)
Patient ID: Billy Bush, male   DOB: Feb 02, 1941, 73 y.o.   MRN: [id number] PCP: Dr. Loanne Drilling  73 yo with history of CAD s/p CABG, ischemic cardiomyopathy, and esophageal cancer presents for cardiology followup.  He was diagnosed in 2014 with a high grade neuroendocrine esophageal cancer (had dysphagia) and has undergone chemotherapy and radiation.  He has completed radiation, now getting monthly carboplatin and etoposide.  He is going for his last round of chemotherapy next week.  Dysphagia has resolved.  Of note, most recent CBC showed fall in platelets to 47,000 as well as worse anemia.  He was again transfused. Last echo was in 2/15 and showed EF 25-30% with moderate MR and TR.  His last cardiac cath was in 2008.  At that time, his bypass grafts were patent but he had Cypher DES to left main.  He is off Plavix for now with anemia and thrombocytopenia.   No chest pain.  No exertional dyspnea.  He can climb a flight of steps with no problems.  No orthopnea or PND.  No syncope or lightheadedness.  At last appointment, I thought he looked volume overloaded so increased his Lasix to 80 qam, 40 qpm.  Weight is now down 7 lbs.  He is finished with chemotherapy and appetite has returned.  Labs (2/14): LDL 81, HDL 43 Labs (9/14): K 3.7, creatinine 0.9 Labs (10/14): hemoglobin 8.3, plts 173 Labs (1/15): HCT 21.2, platelets 47, K 4.0, creatinine 0.79, LDL 62, HDL 50 Labs (2/15): BNP 390, K 4.1, creatinine 0.9, hemoglobin 6.8 => 9.1, plts 75  PMH: 1. Type II diabetes 2. CAD: s/p CABG 1997.  Last LHC in 2008 showed 80% distal LM, patent LIMA-LAD, patent SVG-OM, patent SVG-PDA.  Patient had Cypher DES to left main.  3. Ischemic cardiomyopathy: Patient has a [employer], political [employer] ICD.  Echo in 11/12 showed EF 30-35% with severe LV dilation, EF 30-35%, apical and basal inferior akinesis, moderate MR, mild AI. Echo (2/15) with severe LV dilation, septal and inferior hypokinesis, EF 25-30%, moderate TR, moderate MR, PA  systolic pressure 68 mmHg.  4. 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  5. Hyperlipidemia  SH: Married, retired [employer], prior smoker, lives in [place].  FH: No premature CAD.   ROS: All systems reviewed and negative except as per HPI.   Current Outpatient Prescriptions  Medication Sig Dispense Refill  . aspirin EC 81 MG tablet Take 1 tablet (81 mg total) by mouth daily.      Marland Kitchen atorvastatin (LIPITOR) 80 MG tablet Take 1 tablet by mouth at  bedtime  90 tablet  0  . carvedilol (COREG) 12.5 MG tablet 2 (two) times daily with a meal.       . citalopram (CELEXA) 10 MG tablet Take 1 tablet (10 mg total) by mouth daily.  90 tablet  0  . co-enzyme Q-10 30 MG capsule Take 30 mg by mouth daily.        . furosemide (LASIX) 80 MG tablet 1 tablet (80 mg)in the AM and 1/2 tablet(40mg ) in the PM  45 tablet  3  . glucose blood (ONE TOUCH ULTRA TEST) test strip Use as directed twice daily dx 250.01  200 each  3  . ibuprofen (ADVIL,MOTRIN) 200 MG tablet Take 200 mg by mouth every 8 (eight) hours as needed for pain.      Marland Kitchen insulin lispro (HUMALOG) 100 UNIT/ML injection Inject  into the skin 3x a day (just before each meal) 04-30-13 units      . Insulin Pen Needle (BD PEN NEEDLE NANO U/F) 32G X 4 MM MISC Use as directed four times daily  400 each  3  . losartan (COZAAR) 50 MG tablet 1/2 tablet (total 25mg ) daily      . Multiple Vitamin (MULTIVITAMIN) tablet Take 1 tablet by mouth daily.        . nitroGLYCERIN (NITROSTAT) 0.4 MG SL tablet Place 1 tablet (0.4 mg total) under the tongue every 5 (five) minutes as needed for chest pain.  25 tablet  3  . potassium chloride SA (K-DUR,KLOR-CON) 20 MEQ tablet Take 1 tablet (20 mEq total) by mouth daily.  30 tablet  3  . prochlorperazine (COMPAZINE) 10 MG tablet Take 10 mg by mouth every 6 (six) hours as needed for nausea or vomiting.      Marland Kitchen  spironolactone (ALDACTONE) 25 MG tablet 1 tablet daily  90 tablet  1   No current facility-administered medications for this visit.    BP 110/68  Pulse 65  Ht 5\' 7"  (1.702 m)  Wt 76.712 kg (169 lb 1.9 oz)  BMI 26.48 kg/m2  SpO2 99% General: NAD Neck: JVP 7 cm, no thyromegaly or thyroid nodule.  Lungs: Clear to auscultation bilaterally with normal respiratory effort. CV: Nondisplaced PMI.  Heart regular S1/S2, no S3/S4, 3/6 HSM at apex.  2+ edema 1/3 up lower legs bilaterally.  No carotid bruit.  Normal pedal pulses.  Abdomen: Soft, nontender, no hepatosplenomegaly, no distention.  Neurologic: Alert and oriented x 3.  Psych: Normal affect. Extremities: No clubbing or cyanosis.   Assessment/Plan: 1. CAD: s/p CABG and later Cypher DES to left main in 2008.  He has been on ASA and Plavix chronically.  I have planned to continue Plavix long-term given 1st generation DES and no bleeding problems.  Given current thrombocytopenia and anemia in the setting of chemotherapy, I had him stop Plavix.  When platelets rebound, I will restart him on Plavix.  Continue ASA 81, statin, Coreg, ramipril. 2. Chronic systolic CHF: Volume looks better than at last appointment.  He has NYHA class II symptoms.  EF 25-30% on last echo (basically stable). QRS is not significantly prolonged.  He will continue current doses of Coreg, losartan, and spironolactone (no changes given soft blood pressure). Continue current Lasix dose 80 qam, 40 qpm.  Check BMET/BNP today.  3. Hyperlipidemia: Good lipids in 1/15.   4. Murmur: Moderate MR and TR by echo.   Loralie Champagne 01/15/2014 11:29 PM

## 2014-01-16 ENCOUNTER — Telehealth: Payer: Self-pay | Admitting: *Deleted

## 2014-01-16 DIAGNOSIS — E875 Hyperkalemia: Secondary | ICD-10-CM

## 2014-01-16 NOTE — Telephone Encounter (Signed)
Wife is aware of lab results. Will stop potassium supplement today. Decrease fluid intake & return Friday 01/23/14 for repeat BMP Horton Chin RN

## 2014-01-16 NOTE — Telephone Encounter (Signed)
Message copied by Verna Czech on Fri Jan 16, 2014 10:49 AM ------      Message from: Larey Dresser      Created: Fri Jan 16, 2014 10:09 AM       Stop potassium supplementation.  Cut back on po fluid intake a bit (low sodium).  REpeat BMET in 10 days ------

## 2014-01-23 ENCOUNTER — Other Ambulatory Visit (INDEPENDENT_AMBULATORY_CARE_PROVIDER_SITE_OTHER): Payer: Medicare HMO

## 2014-01-23 DIAGNOSIS — E875 Hyperkalemia: Secondary | ICD-10-CM

## 2014-01-23 LAB — BASIC METABOLIC PANEL
BUN: 22 mg/dL (ref 6–23)
CALCIUM: 8.8 mg/dL (ref 8.4–10.5)
CO2: 27 mEq/L (ref 19–32)
CREATININE: 1 mg/dL (ref 0.4–1.5)
Chloride: 97 mEq/L (ref 96–112)
GFR: 78.44 mL/min (ref >60.00)
GLUCOSE: 250 mg/dL — AB (ref 70–99)
POTASSIUM: 4.1 meq/L (ref 3.5–5.1)
Sodium: 131 mEq/L — ABNORMAL LOW (ref 135–145)

## 2014-01-26 ENCOUNTER — Telehealth: Payer: Self-pay | Admitting: Cardiology

## 2014-01-26 ENCOUNTER — Telehealth: Payer: Self-pay | Admitting: Oncology

## 2014-01-26 ENCOUNTER — Ambulatory Visit (HOSPITAL_BASED_OUTPATIENT_CLINIC_OR_DEPARTMENT_OTHER): Payer: Medicare HMO | Admitting: Nurse Practitioner

## 2014-01-26 ENCOUNTER — Other Ambulatory Visit (HOSPITAL_BASED_OUTPATIENT_CLINIC_OR_DEPARTMENT_OTHER): Payer: Medicare HMO

## 2014-01-26 VITALS — BP 100/51 | HR 66 | Temp 97.9°F | Resp 18 | Ht 67.0 in | Wt 168.4 lb

## 2014-01-26 DIAGNOSIS — C7A1 Malignant poorly differentiated neuroendocrine tumors: Secondary | ICD-10-CM

## 2014-01-26 DIAGNOSIS — C7B8 Other secondary neuroendocrine tumors: Secondary | ICD-10-CM

## 2014-01-26 DIAGNOSIS — C155 Malignant neoplasm of lower third of esophagus: Secondary | ICD-10-CM

## 2014-01-26 DIAGNOSIS — D649 Anemia, unspecified: Secondary | ICD-10-CM

## 2014-01-26 LAB — CBC WITH DIFFERENTIAL/PLATELET
BASO%: 0.3 % (ref 0.0–2.0)
Basophils Absolute: 0 10*3/uL (ref 0.0–0.1)
EOS%: 1.7 % (ref 0.0–7.0)
Eosinophils Absolute: 0.2 10*3/uL (ref 0.0–0.5)
HEMATOCRIT: 24.6 % — AB (ref 38.4–49.9)
HGB: 7.9 g/dL — ABNORMAL LOW (ref 13.0–17.1)
LYMPH#: 0.6 10*3/uL — AB (ref 0.9–3.3)
LYMPH%: 6.6 % — ABNORMAL LOW (ref 14.0–49.0)
MCH: 32.6 pg (ref 27.2–33.4)
MCHC: 32.1 g/dL (ref 32.0–36.0)
MCV: 101.7 fL — ABNORMAL HIGH (ref 79.3–98.0)
MONO#: 0.8 10*3/uL (ref 0.1–0.9)
MONO%: 8.8 % (ref 0.0–14.0)
NEUT#: 7.4 10*3/uL — ABNORMAL HIGH (ref 1.5–6.5)
NEUT%: 82.6 % — ABNORMAL HIGH (ref 39.0–75.0)
NRBC: 0 % (ref 0–0)
Platelets: 139 10*3/uL — ABNORMAL LOW (ref 140–400)
RBC: 2.42 10*6/uL — AB (ref 4.20–5.82)
RDW: 18.5 % — ABNORMAL HIGH (ref 11.0–14.6)
WBC: 8.9 10*3/uL (ref 4.0–10.3)

## 2014-01-26 MED ORDER — TRAMADOL HCL 50 MG PO TABS: 50.0000 mg | ORAL_TABLET | Freq: Two times a day (BID) | ORAL | Status: DC | PRN

## 2014-01-26 NOTE — Telephone Encounter (Signed)
He can restart Plavix 75 mg daily.

## 2014-01-26 NOTE — Progress Notes (Addendum)
OFFICE PROGRESS NOTE  Interval history:  Mr. 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 He completed cycle 3 of carboplatin/etoposide beginning 12/29/2013. He feels well. He has a good appetite. He is gaining weight. No dysphagia. He denies nausea/vomiting. Bowels moving regularly. He denies shortness of breath. No chest pain. He has a good energy level. Over the past several weeks he has noted intermittent bilateral shoulder pain. He thinks the pain is related to arthritis or a pulled muscle. He takes Tylenol as needed.   Objective: Filed Vitals:   01/26/14 0931  BP: 100/51  Pulse: 66  Temp: 97.9 F (36.6 C)  Resp: 18   Oropharynx is without thrush or ulceration. No palpable cervical, supraclavicular or axillary lymph nodes. Lungs are clear. Regular cardiac rhythm. 2/6 systolic murmur. Abdomen soft and nontender. No organomegaly. Pitting edema at the lower legs bilaterally. Slight asymmetry at the bilateral scapula with increased prominence on the left. No mass.   Lab Results: Lab Results  Component Value Date   WBC 8.9 01/26/2014   HGB 7.9* 01/26/2014   HCT 24.6* 01/26/2014   MCV 101.7* 01/26/2014   PLT 139* 01/26/2014   NEUTROABS 7.4* 01/26/2014    Chemistry:    Chemistry      Component Value Date/Time   NA 131* 01/23/2014 1133   NA 136 12/29/2013 0959   K 4.1 01/23/2014 1133   K 4.1 12/29/2013 0959   CL 97 01/23/2014 1133   CO2 27 01/23/2014 1133   CO2 27 12/29/2013 0959   BUN 22 01/23/2014 1133   BUN 19.3 12/29/2013 0959   CREATININE 1.0 01/23/2014 1133   CREATININE 0.9 12/29/2013 0959      Component Value Date/Time   CALCIUM 8.8 01/23/2014 1133   CALCIUM 9.1 12/29/2013 0959   ALKPHOS 103 12/29/2013 0959   ALKPHOS 83 10/27/2013 1116   AST 19 12/29/2013 0959   AST 32 10/27/2013 1116   ALT 11 12/29/2013 0959   ALT 26 10/27/2013 1116   BILITOT 1.09 12/29/2013 0959   BILITOT 0.8 10/27/2013 1116       Studies/Results: No results found.  Medications: I have reviewed the  patient's current medications.  Assessment/Plan: 1. High-grade neuroendocrine carcinoma of the esophagus, clinical stage III. Staging PET scan 07/23/2013 showed a large distal esophageal mass with neoplastic range FDG uptake, metastatic upper abdominal lymphadenopathy, indeterminate small pulmonary nodules. -initiation of radiation on 08/06/2013. Radiation completed 09/12/2013.  -Initiation of weekly carboplatin 08/07/2013. Final weekly carboplatin 08/28/2013.  -Restaging CT of the chest, abdomen, and pelvis on 09/25/2013: Decreased esophagus mass, decreased upper abdomen adenopathy. No new or progressive disease per  -Cycle 1 etoposide/carboplatin 10/06/2013.  -Cycle 2 carboplatin/etoposide beginning 11/03/2013. Dose reduction of the carboplatin and etoposide with cycle 2 due to hematologic toxicity following cycle 1.  -Cycle 3 etoposide/carboplatin 12/02/2013. -Cycle 4 carboplatin/etoposide beginning 12/29/2013.  2. Dysphagia secondary to #1. Resolved. 3. History of coronary artery disease. 4. Congestive heart failure. 5. Diabetes. 6. History of weight loss. He is gaining weight. 7. Mild hypotension.  8. 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 He required a red cell transfusion following cycle 3 and cycle 4 etoposide/carboplatin. 9. History of thrombocytopenia secondary to chemotherapy. 10. Hospitalization 10/20/2013 following a fall 11. Intermittent pain at bilateral scapula.   Dispositon-he appears well. He has completed 4 cycles of carboplatin/etoposide chemotherapy. We are referring him for restaging CT scans in the next few weeks. He will return for a followup visit on 02/12/2014 to review the  results.  For the shoulder blade pain he was given a prescription for Ultram 50 mg every 12 hours as needed. He will contact the office if this is not effective.   Patient seen with Dr. Benay Spice.   Ned Card ANP/GNP-BC  This was a shared visit with Ned Card. He has  completed the planned course of systemic therapy. He will be referred for a restaging CT evaluation prior to an office visit 02/12/2014.  Julieanne Manson, M.D.

## 2014-01-26 NOTE — Telephone Encounter (Signed)
gv pt appt schedule for march including ct. ct was already on schedule. per pt request moved time from PM to AM

## 2014-01-26 NOTE — Telephone Encounter (Signed)
Wife advised. 

## 2014-01-26 NOTE — Telephone Encounter (Signed)
New problem   Pt had blood work and need to know if any adjustment need to be made. Please call pt

## 2014-01-26 NOTE — Telephone Encounter (Signed)
Pt's wife given results of BMET done 01/23/14. Wife states platelet count 139,000 this AM at [facility] (results in [place]). She  is asking if pt should continue to hold Plavix. I will forward to Dr Aundra Dubin for review.

## 2014-01-27 ENCOUNTER — Encounter: Payer: Self-pay | Admitting: Internal Medicine

## 2014-02-02 ENCOUNTER — Other Ambulatory Visit: Payer: Self-pay

## 2014-02-02 DIAGNOSIS — I2510 Atherosclerotic heart disease of native coronary artery without angina pectoris: Secondary | ICD-10-CM

## 2014-02-02 DIAGNOSIS — I5022 Chronic systolic (congestive) heart failure: Secondary | ICD-10-CM

## 2014-02-02 DIAGNOSIS — I2589 Other forms of chronic ischemic heart disease: Secondary | ICD-10-CM

## 2014-02-02 MED ORDER — LOSARTAN POTASSIUM 50 MG PO TABS: ORAL_TABLET | ORAL | Status: DC

## 2014-02-02 MED ORDER — FUROSEMIDE 80 MG PO TABS: ORAL_TABLET | ORAL | Status: DC

## 2014-02-02 MED ORDER — SPIRONOLACTONE 25 MG PO TABS: ORAL_TABLET | ORAL | Status: DC

## 2014-02-02 MED ORDER — IBUPROFEN 200 MG PO TABS: 200.0000 mg | ORAL_TABLET | Freq: Three times a day (TID) | ORAL | Status: DC | PRN

## 2014-02-02 MED ORDER — POTASSIUM CHLORIDE CRYS ER 20 MEQ PO TBCR: 20.0000 meq | EXTENDED_RELEASE_TABLET | Freq: Every day | ORAL | Status: DC

## 2014-02-03 ENCOUNTER — Telehealth: Payer: Self-pay | Admitting: *Deleted

## 2014-02-03 NOTE — Telephone Encounter (Signed)
DERMATOLOGIST HAD RECENTLY FROZE OFF SOME SKIN DAMAGE ON PT.'S FACE. A SCAB HAD DEVELOPED ON THE SIDE OF PT.'S FACE. THE SCAB HAD BEEN KNOCKED OFF AND THERE HAS BEEN A CONTINUAL SLOW BLEED AFTER AN HOUR OF PRESSURE. NURSE NOTIFIED THE PHYSICIAN ON CALL AND INSTRUCTED THE PT. TO GO TO EMERGENCY ROOM. PT. STATED HE WOULD CONTINUE TO MONITOR HIS SITUATION. THE BLEEDING STOPPED. SPOKE TO PT.'S WIFE THIS MORNING. NO MORE BLEEDING NOTED.

## 2014-02-04 ENCOUNTER — Other Ambulatory Visit: Payer: Self-pay | Admitting: *Deleted

## 2014-02-04 DIAGNOSIS — I2589 Other forms of chronic ischemic heart disease: Secondary | ICD-10-CM

## 2014-02-04 DIAGNOSIS — I2510 Atherosclerotic heart disease of native coronary artery without angina pectoris: Secondary | ICD-10-CM

## 2014-02-04 MED ORDER — CARVEDILOL 12.5 MG PO TABS: 12.5000 mg | ORAL_TABLET | Freq: Two times a day (BID) | ORAL | Status: DC

## 2014-02-05 ENCOUNTER — Telehealth: Payer: Self-pay

## 2014-02-05 MED ORDER — ATORVASTATIN CALCIUM 80 MG PO TABS: ORAL_TABLET | ORAL | Status: DC

## 2014-02-05 MED ORDER — CITALOPRAM HYDROBROMIDE 10 MG PO TABS: 10.0000 mg | ORAL_TABLET | Freq: Every day | ORAL | Status: AC

## 2014-02-05 MED ORDER — GLUCOSE BLOOD VI STRP: ORAL_STRIP | Status: DC

## 2014-02-05 NOTE — Telephone Encounter (Signed)
Pt is requesting a refill for Clexa 10mg  for a 90 day supply to be sent to his new mail order pharmacy. Medication was last filled on 09/30/2013 and pt was last seen on 10/29/2013. Thanks!

## 2014-02-05 NOTE — Telephone Encounter (Signed)
Please refill prn 

## 2014-02-05 NOTE — Telephone Encounter (Addendum)
Script sent electronically 

## 2014-02-06 ENCOUNTER — Ambulatory Visit: Payer: Medicare HMO | Admitting: Cardiology

## 2014-02-09 ENCOUNTER — Telehealth: Payer: Self-pay | Admitting: Oncology

## 2014-02-09 ENCOUNTER — Ambulatory Visit (HOSPITAL_BASED_OUTPATIENT_CLINIC_OR_DEPARTMENT_OTHER): Payer: Medicare HMO

## 2014-02-09 ENCOUNTER — Ambulatory Visit (HOSPITAL_COMMUNITY)
Admission: RE | Admit: 2014-02-09 | Discharge: 2014-02-09 | Disposition: A | Discharge status: Home / Self Care | Payer: Medicare HMO | Source: Ambulatory Visit | Attending: Nurse Practitioner | Admitting: Nurse Practitioner

## 2014-02-09 ENCOUNTER — Other Ambulatory Visit (HOSPITAL_BASED_OUTPATIENT_CLINIC_OR_DEPARTMENT_OTHER): Payer: Medicare HMO

## 2014-02-09 ENCOUNTER — Ambulatory Visit (HOSPITAL_COMMUNITY)
Admission: RE | Admit: 2014-02-09 | Discharge: 2014-02-09 | Disposition: A | Discharge status: Home / Self Care | Payer: Medicare HMO | Source: Ambulatory Visit | Attending: Oncology | Admitting: Oncology

## 2014-02-09 ENCOUNTER — Other Ambulatory Visit: Payer: Self-pay | Admitting: *Deleted

## 2014-02-09 ENCOUNTER — Encounter (HOSPITAL_COMMUNITY): Payer: Self-pay

## 2014-02-09 ENCOUNTER — Ambulatory Visit (HOSPITAL_COMMUNITY): Payer: Medicare HMO

## 2014-02-09 VITALS — BP 107/58 | HR 71 | Temp 97.9°F | Resp 18

## 2014-02-09 DIAGNOSIS — J90 Pleural effusion, not elsewhere classified: Secondary | ICD-10-CM | POA: Insufficient documentation

## 2014-02-09 DIAGNOSIS — C155 Malignant neoplasm of lower third of esophagus: Secondary | ICD-10-CM

## 2014-02-09 DIAGNOSIS — Z9221 Personal history of antineoplastic chemotherapy: Secondary | ICD-10-CM | POA: Insufficient documentation

## 2014-02-09 DIAGNOSIS — Z923 Personal history of irradiation: Secondary | ICD-10-CM | POA: Insufficient documentation

## 2014-02-09 DIAGNOSIS — D649 Anemia, unspecified: Secondary | ICD-10-CM

## 2014-02-09 DIAGNOSIS — C7A1 Malignant poorly differentiated neuroendocrine tumors: Secondary | ICD-10-CM

## 2014-02-09 DIAGNOSIS — K7689 Other specified diseases of liver: Secondary | ICD-10-CM | POA: Insufficient documentation

## 2014-02-09 DIAGNOSIS — C159 Malignant neoplasm of esophagus, unspecified: Secondary | ICD-10-CM | POA: Insufficient documentation

## 2014-02-09 DIAGNOSIS — K8020 Calculus of gallbladder without cholecystitis without obstruction: Secondary | ICD-10-CM | POA: Insufficient documentation

## 2014-02-09 LAB — CBC WITH DIFFERENTIAL/PLATELET
BASO%: 0.1 % (ref 0.0–2.0)
Basophils Absolute: 0 10*3/uL (ref 0.0–0.1)
EOS ABS: 0.7 10*3/uL — AB (ref 0.0–0.5)
EOS%: 10.4 % — AB (ref 0.0–7.0)
HCT: 22.8 % — ABNORMAL LOW (ref 38.4–49.9)
HEMOGLOBIN: 7.2 g/dL — AB (ref 13.0–17.1)
LYMPH#: 0.3 10*3/uL — AB (ref 0.9–3.3)
LYMPH%: 3.9 % — ABNORMAL LOW (ref 14.0–49.0)
MCH: 32.6 pg (ref 27.2–33.4)
MCHC: 31.6 g/dL — ABNORMAL LOW (ref 32.0–36.0)
MCV: 103.2 fL — ABNORMAL HIGH (ref 79.3–98.0)
MONO#: 0.6 10*3/uL (ref 0.1–0.9)
MONO%: 9 % (ref 0.0–14.0)
NEUT%: 76.6 % — ABNORMAL HIGH (ref 39.0–75.0)
NEUTROS ABS: 5.5 10*3/uL (ref 1.5–6.5)
Platelets: 161 10*3/uL (ref 140–400)
RBC: 2.21 10*6/uL — ABNORMAL LOW (ref 4.20–5.82)
RDW: 16.9 % — AB (ref 11.0–14.6)
WBC: 7.1 10*3/uL (ref 4.0–10.3)

## 2014-02-09 LAB — PREPARE RBC (CROSSMATCH)

## 2014-02-09 LAB — COMPREHENSIVE METABOLIC PANEL (CC13)
ALK PHOS: 103 U/L (ref 40–150)
ALT: 11 U/L (ref 0–55)
AST: 18 U/L (ref 5–34)
Albumin: 3.5 g/dL (ref 3.5–5.0)
Anion Gap: 7 mEq/L (ref 3–11)
BUN: 25.1 mg/dL (ref 7.0–26.0)
CO2: 28 mEq/L (ref 22–29)
Calcium: 9.1 mg/dL (ref 8.4–10.4)
Chloride: 100 mEq/L (ref 98–109)
Creatinine: 1 mg/dL (ref 0.7–1.3)
Glucose: 172 mg/dl — ABNORMAL HIGH (ref 70–140)
POTASSIUM: 4.5 meq/L (ref 3.5–5.1)
SODIUM: 135 meq/L — AB (ref 136–145)
Total Bilirubin: 0.84 mg/dL (ref 0.20–1.20)
Total Protein: 6.1 g/dL — ABNORMAL LOW (ref 6.4–8.3)

## 2014-02-09 LAB — HOLD TUBE, BLOOD BANK

## 2014-02-09 MED ORDER — SODIUM CHLORIDE 0.9 % IV SOLN
250.0000 mL | Freq: Once | INTRAVENOUS | Status: AC
  Administered 2014-02-09: 250 mL via INTRAVENOUS

## 2014-02-09 MED ORDER — IOHEXOL 300 MG/ML  SOLN
100.0000 mL | Freq: Once | INTRAMUSCULAR | Status: AC | PRN
  Administered 2014-02-09: 100 mL via INTRAVENOUS

## 2014-02-09 NOTE — Telephone Encounter (Signed)
, °

## 2014-02-09 NOTE — Patient Instructions (Signed)
Blood Transfusion Information WHAT IS A BLOOD TRANSFUSION? A transfusion is the replacement of blood or some of its parts. Blood is made up of multiple cells which provide different functions.  Red blood cells carry oxygen and are used for blood loss replacement.  White blood cells fight against infection.  Platelets control bleeding.  Plasma helps clot blood.  Other blood products are available for specialized needs, such as hemophilia or other clotting disorders. BEFORE THE TRANSFUSION  Who gives blood for transfusions?   You may be able to donate blood to be used at a later date on yourself (autologous donation).  Relatives can be asked to donate blood. This is generally not any safer than if you have received blood from a stranger. The same precautions are taken to ensure safety when a relative's blood is donated.  Healthy volunteers who are fully evaluated to make sure their blood is safe. This is blood bank blood. Transfusion therapy is the safest it has ever been in the practice of medicine. Before blood is taken from a donor, a complete history is taken to make sure that person has no history of diseases nor engages in risky social behavior (examples are intravenous drug use or sexual activity with multiple partners). The donor's travel history is screened to minimize risk of transmitting infections, such as malaria. The donated blood is tested for signs of infectious diseases, such as HIV and hepatitis. The blood is then tested to be sure it is compatible with you in order to minimize the chance of a transfusion reaction. If you or a relative donates blood, this is often done in anticipation of surgery and is not appropriate for emergency situations. It takes many days to process the donated blood. RISKS AND COMPLICATIONS Although transfusion therapy is very safe and saves many lives, the main dangers of transfusion include:   Getting an infectious disease.  Developing a  transfusion reaction. This is an allergic reaction to something in the blood you were given. Every precaution is taken to prevent this. The decision to have a blood transfusion has been considered carefully by your caregiver before blood is given. Blood is not given unless the benefits outweigh the risks. AFTER THE TRANSFUSION  Right after receiving a blood transfusion, you will usually feel much better and more energetic. This is especially true if your red blood cells have gotten low (anemic). The transfusion raises the level of the red blood cells which carry oxygen, and this usually causes an energy increase.  The nurse administering the transfusion will monitor you carefully for complications. HOME CARE INSTRUCTIONS  No special instructions are needed after a transfusion. You may find your energy is better. Speak with your caregiver about any limitations on activity for underlying diseases you may have. SEEK MEDICAL CARE IF:   Your condition is not improving after your transfusion.  You develop redness or irritation at the intravenous (IV) site. SEEK IMMEDIATE MEDICAL CARE IF:  Any of the following symptoms occur over the next 12 hours:  Shaking chills.  You have a temperature by mouth above 102 F (38.9 C), not controlled by medicine.  Chest, back, or muscle pain.  People around you feel you are not acting correctly or are confused.  Shortness of breath or difficulty breathing.  Dizziness and fainting.  You get a rash or develop hives.  You have a decrease in urine output.  Your urine turns a dark color or changes to pink, red, or brown. Any of the following   symptoms occur over the next 10 days:  You have a temperature by mouth above 102 F (38.9 C), not controlled by medicine.  Shortness of breath.  Weakness after normal activity.  The white part of the eye turns yellow (jaundice).  You have a decrease in the amount of urine or are urinating less often.  Your  urine turns a dark color or changes to pink, red, or brown. Document Released: 11/10/2000 Document Revised: 02/05/2012 Document Reviewed: 06/29/2008 [employer] Patient Information 2014 [employer].  

## 2014-02-10 ENCOUNTER — Ambulatory Visit: Payer: Medicare Other

## 2014-02-10 LAB — TYPE AND SCREEN
ABO/RH(D): A POS
ANTIBODY SCREEN: NEGATIVE
Unit division: 0
Unit division: 0

## 2014-02-12 ENCOUNTER — Other Ambulatory Visit: Payer: Self-pay | Admitting: *Deleted

## 2014-02-12 ENCOUNTER — Ambulatory Visit (HOSPITAL_BASED_OUTPATIENT_CLINIC_OR_DEPARTMENT_OTHER): Payer: Medicare HMO | Admitting: Oncology

## 2014-02-12 ENCOUNTER — Telehealth: Payer: Self-pay | Admitting: Oncology

## 2014-02-12 VITALS — BP 102/39 | HR 64 | Temp 97.6°F | Resp 18 | Ht 67.0 in | Wt 171.1 lb

## 2014-02-12 DIAGNOSIS — R911 Solitary pulmonary nodule: Secondary | ICD-10-CM

## 2014-02-12 DIAGNOSIS — M949 Disorder of cartilage, unspecified: Secondary | ICD-10-CM

## 2014-02-12 DIAGNOSIS — M899 Disorder of bone, unspecified: Secondary | ICD-10-CM

## 2014-02-12 DIAGNOSIS — D6481 Anemia due to antineoplastic chemotherapy: Secondary | ICD-10-CM

## 2014-02-12 DIAGNOSIS — D649 Anemia, unspecified: Secondary | ICD-10-CM

## 2014-02-12 DIAGNOSIS — I959 Hypotension, unspecified: Secondary | ICD-10-CM

## 2014-02-12 DIAGNOSIS — E46 Unspecified protein-calorie malnutrition: Secondary | ICD-10-CM

## 2014-02-12 DIAGNOSIS — T451X5A Adverse effect of antineoplastic and immunosuppressive drugs, initial encounter: Secondary | ICD-10-CM

## 2014-02-12 DIAGNOSIS — C7A1 Malignant poorly differentiated neuroendocrine tumors: Secondary | ICD-10-CM

## 2014-02-12 DIAGNOSIS — C155 Malignant neoplasm of lower third of esophagus: Secondary | ICD-10-CM

## 2014-02-12 MED ORDER — TRAMADOL HCL 50 MG PO TABS: 50.0000 mg | ORAL_TABLET | Freq: Two times a day (BID) | ORAL | Status: DC | PRN

## 2014-02-12 NOTE — Progress Notes (Signed)
   Billy Bush    OFFICE PROGRESS NOTE   INTERVAL HISTORY:   Billy Bush returns as scheduled. He feels well. He was transfused with packed red blood cells on 02/09/2014 after the hemoglobin returned at 7.2.  No dysphagia. Shoulder discomfort is relieved with tramadol. [place]  Objective:  Vital signs in last 24 hours:  Blood pressure 102/39, pulse 64, temperature 97.6 F (36.4 C), temperature source Oral, resp. rate 18, height 5\' 7"  (1.702 m), weight 171 lb 1.6 oz (77.61 kg), SpO2 97.00%.    HEENT: Neck without mass Lymphatics: No cervical or supraclavicular nodes Resp: Lungs clear bilaterally Cardio: Regular rate and rhythm GI: No hepatomegaly, nontender, no mass Vascular: Trace to 1+ pitting edema at the low leg bilaterally   Lab Results:  Lab Results  Component Value Date   WBC 7.1 02/09/2014   HGB 7.2* 02/09/2014   HCT 22.8* 02/09/2014   MCV 103.2* 02/09/2014   PLT 161 02/09/2014   NEUTROABS 5.5 02/09/2014   X-rays: CTs of the chest and abdomen on 02/09/2014, compared to 09/25/2013-slight enlargement of mediastinal lymph nodes, improvement in distal esophageal wall thickening, small liver lesions are unchanged, resolution of gastrohepatic adenopathy, new  peribronchovascular right lower lobe airspace disease I reviewed the CT images with Billy Bush and his family   Medications: I have reviewed the patient's current medications.  Assessment/Plan: 1. High-grade neuroendocrine carcinoma of the esophagus, clinical stage III. Staging PET scan 07/23/2013 showed a large distal esophageal mass with neoplastic range FDG uptake, metastatic upper abdominal lymphadenopathy, indeterminate small pulmonary nodules. -initiation of radiation on 08/06/2013. Radiation completed 09/12/2013.  -Initiation of weekly carboplatin 08/07/2013. Final weekly carboplatin 08/28/2013.  -Restaging CT of the chest, abdomen, and pelvis on 09/25/2013:  Decreased esophagus mass, decreased upper abdomen adenopathy. No new or progressive disease per  -Cycle 1 etoposide/carboplatin 10/06/2013.  -Cycle 2 carboplatin/etoposide beginning 11/03/2013. Dose reduction of the carboplatin and etoposide with cycle 2 due to hematologic toxicity following cycle 1.  -Cycle 3 etoposide/carboplatin 12/02/2013.  -Cycle 4 carboplatin/etoposide beginning 12/29/2013.  2. Dysphagia secondary to #1. Resolved. 3. History of coronary artery disease. 4. Congestive heart failure. 5. Diabetes. 6. History of weight loss. He is gaining weight. 7. Mild hypotension.  8. 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 He required  red cell transfusions following cycle 3 and cycle 4 etoposide/carboplatin, last transfusion 02/09/2014 9. History of thrombocytopenia secondary to chemotherapy. 10. Hospitalization 10/20/2013 following a fall 11. Intermittent pain at bilateral scapula. Likely benign musculoskeletal pain    Disposition:  Billy Bush is in clinical remission from the esophagus cancer. The small mediastinal lymph nodes could represent early disease progression versus reactive changes. I suspect the parenchymal changes in the lungs are related to radiation/inflammation. He will contact us for respiratory symptoms.  Billy Bush has persistent anemia. This is likely related to chemotherapy/radiation and malnutrition. The platelet count has recovered. I suspect the hemoglobin will improve over the next several weeks. He will be scheduled for a CBC in 2 weeks. He will return for an office visit in 3 months.   Betsy Coder, MD  02/12/2014  5:20 PM

## 2014-02-12 NOTE — Telephone Encounter (Signed)
Gave pt appt for labs and MD on April and June 2015

## 2014-02-19 ENCOUNTER — Other Ambulatory Visit: Payer: Self-pay | Admitting: Endocrinology

## 2014-02-24 ENCOUNTER — Ambulatory Visit (INDEPENDENT_AMBULATORY_CARE_PROVIDER_SITE_OTHER): Payer: Medicare HMO

## 2014-02-24 VITALS — BP 117/76 | HR 65 | Resp 16

## 2014-02-24 DIAGNOSIS — E1149 Type 2 diabetes mellitus with other diabetic neurological complication: Secondary | ICD-10-CM

## 2014-02-24 DIAGNOSIS — M79609 Pain in unspecified limb: Secondary | ICD-10-CM

## 2014-02-24 DIAGNOSIS — E1140 Type 2 diabetes mellitus with diabetic neuropathy, unspecified: Secondary | ICD-10-CM

## 2014-02-24 DIAGNOSIS — B351 Tinea unguium: Secondary | ICD-10-CM

## 2014-02-24 MED ORDER — TAVABOROLE 5 % EX SOLN: CUTANEOUS | Status: DC

## 2014-02-24 NOTE — Patient Instructions (Addendum)
Diabetes and Foot Care Diabetes may cause you to have problems because of poor blood supply (circulation) to your feet and legs. This may cause the skin on your feet to become thinner, break easier, and heal more slowly. Your skin may become dry, and the skin may peel and crack. You may also have nerve damage in your legs and feet causing decreased feeling in them. You may not notice minor injuries to your feet that could lead to infections or more serious problems. Taking care of your feet is one of the most important things you can do for yourself.  HOME CARE INSTRUCTIONS  Wear shoes at all times, even in the house. Do not go barefoot. Bare feet are easily injured.  Check your feet daily for blisters, cuts, and redness. If you cannot see the bottom of your feet, use a mirror or ask someone for help.  Wash your feet with warm water (do not use hot water) and mild soap. Then pat your feet and the areas between your toes until they are completely dry. Do not soak your feet as this can dry your skin.  Apply a moisturizing lotion or petroleum jelly (that does not contain alcohol and is unscented) to the skin on your feet and to dry, brittle toenails. Do not apply lotion between your toes.  Trim your toenails straight across. Do not dig under them or around the cuticle. File the edges of your nails with an emery board or nail file.  Do not cut corns or calluses or try to remove them with medicine.  Wear clean socks or stockings every day. Make sure they are not too tight. Do not wear knee-high stockings since they may decrease blood flow to your legs.  Wear shoes that fit properly and have enough cushioning. To break in new shoes, wear them for just a few hours a day. This prevents you from injuring your feet. Always look in your shoes before you put them on to be sure there are no objects inside.  Do not cross your legs. This may decrease the blood flow to your feet.  If you find a minor scrape,  cut, or break in the skin on your feet, keep it and the skin around it clean and dry. These areas may be cleansed with mild soap and water. Do not cleanse the area with peroxide, alcohol, or iodine.  When you remove an adhesive bandage, be sure not to damage the skin around it.  If you have a wound, look at it several times a day to make sure it is healing.  Do not use heating pads or hot water bottles. They may burn your skin. If you have lost feeling in your feet or legs, you may not know it is happening until it is too late.  Make sure your health care provider performs a complete foot exam at least annually or more often if you have foot problems. Report any cuts, sores, or bruises to your health care provider immediately. SEEK MEDICAL CARE IF:   You have an injury that is not healing.  You have cuts or breaks in the skin.  You have an ingrown nail.  You notice redness on your legs or feet.  You feel burning or tingling in your legs or feet.  You have pain or cramps in your legs and feet.  Your legs or feet are numb.  Your feet always feel cold. SEEK IMMEDIATE MEDICAL CARE IF:   There is increasing redness,   swelling, or pain in or around a wound.  There is a red line that goes up your leg.  Pus is coming from a wound.  You develop a fever or as directed by your health care provider.  You notice a bad smell coming from an ulcer or wound. Document Released: 11/10/2000 Document Revised: 07/16/2013 Document Reviewed: 04/22/2013 [facility] Patient Information 2014 Billy Bush.     Also be antifungal medication for nails has been ordered through the underlying pharmacy. The pharmacy will deliver this to your home directly apply the keratin once daily to each affected nail as instructed apply daily for 12 months duration discontinue if any irritation to Is to the skin or nail edge

## 2014-02-24 NOTE — Progress Notes (Signed)
   Subjective:    Patient ID: Billy Bush, male    DOB: 09/25/1941, 73 y.o.   MRN: [id number]  HPI Comments: "Cut the toenails"     Review of Systems no new changes or findings     Objective:   Physical Exam Neurovascular status is intact and unchanged patient does have pedal pulses DP plus one over 4 PT thready nonpalpable bilateral patient has no edema patient has completed his chemoradiation treatments recovering at his care started grow back patient starting to feel somewhat better is gaining some weight back again. Neurologically epicritic and proprioceptive sensations intact although diminished on Semmes Weinstein to the toes and distal digits. Skin color pigment normal hair growth absent nails thick yellow brittle friable discolored painful is a diabetic hallux second and third digits bilateral painful tender palpation with enclosed shoe wear and ambulation at this time patient is requesting topical nail antifungal treatment and a prescription for [place] Mon is called [facility]. These will be delivered record at home patient apply daily to the affected nails once daily for 12 months duration. Orthopedic biomechanical exam unremarkable no open wounds ulcerations noted no other new changes noted.       Assessment & Plan:  Painful mycotic nails debridement presence of diabetes and complications patient is recovering well from his chemotherapy and radiation treatments will followup in the future as needed suggest 3 month followup for palliative nail care painful mycotic nails debrided x6 return for future palliative nail care initiate topical antifungal treatment is recommended  Harriet Masson DPM

## 2014-02-26 ENCOUNTER — Other Ambulatory Visit (HOSPITAL_BASED_OUTPATIENT_CLINIC_OR_DEPARTMENT_OTHER): Payer: Medicare HMO

## 2014-02-26 ENCOUNTER — Telehealth: Payer: Self-pay | Admitting: *Deleted

## 2014-02-26 ENCOUNTER — Telehealth: Payer: Self-pay | Admitting: Oncology

## 2014-02-26 ENCOUNTER — Telehealth: Payer: Self-pay | Admitting: Cardiology

## 2014-02-26 DIAGNOSIS — C7A1 Malignant poorly differentiated neuroendocrine tumors: Secondary | ICD-10-CM

## 2014-02-26 DIAGNOSIS — D696 Thrombocytopenia, unspecified: Secondary | ICD-10-CM

## 2014-02-26 DIAGNOSIS — D649 Anemia, unspecified: Secondary | ICD-10-CM

## 2014-02-26 DIAGNOSIS — I2589 Other forms of chronic ischemic heart disease: Secondary | ICD-10-CM

## 2014-02-26 DIAGNOSIS — I5022 Chronic systolic (congestive) heart failure: Secondary | ICD-10-CM

## 2014-02-26 LAB — CBC WITH DIFFERENTIAL/PLATELET
BASO%: 0.1 % (ref 0.0–2.0)
BASOS ABS: 0 10*3/uL (ref 0.0–0.1)
EOS ABS: 0.2 10*3/uL (ref 0.0–0.5)
EOS%: 3.1 % (ref 0.0–7.0)
HCT: 25.9 % — ABNORMAL LOW (ref 38.4–49.9)
HGB: 8.3 g/dL — ABNORMAL LOW (ref 13.0–17.1)
LYMPH%: 6 % — AB (ref 14.0–49.0)
MCH: 31.4 pg (ref 27.2–33.4)
MCHC: 32 g/dL (ref 32.0–36.0)
MCV: 98.1 fL — AB (ref 79.3–98.0)
MONO#: 0.6 10*3/uL (ref 0.1–0.9)
MONO%: 8.3 % (ref 0.0–14.0)
NEUT#: 5.5 10*3/uL (ref 1.5–6.5)
NEUT%: 82.5 % — AB (ref 39.0–75.0)
PLATELETS: 90 10*3/uL — AB (ref 140–400)
RBC: 2.64 10*6/uL — ABNORMAL LOW (ref 4.20–5.82)
RDW: 15.9 % — ABNORMAL HIGH (ref 11.0–14.6)
WBC: 6.7 10*3/uL (ref 4.0–10.3)
lymph#: 0.4 10*3/uL — ABNORMAL LOW (ref 0.9–3.3)

## 2014-02-26 NOTE — Telephone Encounter (Signed)
Spoke with pt in lobby, he denies bleeding or need for transfusion. PLT 90, reviewed with Lattie Haw, NP. Order received for CBC in one week. Pt instructed to call office for bleeding or unusual bruising. He voiced understanding. Requests to be called with appt.

## 2014-02-26 NOTE — Telephone Encounter (Signed)
Left pt a message to call back. 

## 2014-02-26 NOTE — Telephone Encounter (Signed)
New problem   Pt's wife stated she need to speak to nurse concerning her husband platelets are down . Please call pt's wife

## 2014-02-26 NOTE — Telephone Encounter (Signed)
Pt's wife states that pt had blood work done at [facility]; his platelets count is 90 (L) Pt's wife would like to know if pt needs to stop the Plavix and Baby Asprin medication. Pt is not having any bleeding at this time.Pt and wife have been instructed by the Oncology service that if he has any bleeding to go to the ER.

## 2014-02-26 NOTE — Telephone Encounter (Signed)
lmonvm advising the pt of his lab appt on 03/05/2014

## 2014-02-26 NOTE — Telephone Encounter (Signed)
Does not have to stop it now but would repeat CBC in 1 week to make sure it does not go down lower.

## 2014-02-27 NOTE — Telephone Encounter (Signed)
Left pt a detail message and to call back.

## 2014-03-02 ENCOUNTER — Telehealth: Payer: Self-pay | Admitting: Endocrinology

## 2014-03-02 MED ORDER — FUROSEMIDE 80 MG PO TABS: ORAL_TABLET | ORAL | Status: DC

## 2014-03-02 MED ORDER — GLUCOSE BLOOD VI STRP: ORAL_STRIP | Status: AC

## 2014-03-02 NOTE — Telephone Encounter (Signed)
LMTCB

## 2014-03-02 NOTE — Telephone Encounter (Signed)
pts wife called and would like to have his rx to [employer] [facility] rx One touch ultra strips  lipitor atorvastatin 80 mg   Call back:[phone]  Thank You :)

## 2014-03-02 NOTE — Telephone Encounter (Signed)
Patient is returning your call. Please call back.  °

## 2014-03-02 NOTE — Telephone Encounter (Signed)
Rx Sent  

## 2014-03-02 NOTE — Telephone Encounter (Signed)
Pt's wife advised to continue Plavix right now and repeat CBC in 1 week. Pt's wife advised, she states CBCd scheduled for Thursday April 9 already.

## 2014-03-04 ENCOUNTER — Encounter: Payer: Self-pay | Admitting: Internal Medicine

## 2014-03-04 ENCOUNTER — Ambulatory Visit (INDEPENDENT_AMBULATORY_CARE_PROVIDER_SITE_OTHER): Payer: Medicare HMO | Admitting: Internal Medicine

## 2014-03-04 ENCOUNTER — Telehealth: Payer: Self-pay | Admitting: *Deleted

## 2014-03-04 ENCOUNTER — Ambulatory Visit (INDEPENDENT_AMBULATORY_CARE_PROVIDER_SITE_OTHER)
Admission: RE | Admit: 2014-03-04 | Discharge: 2014-03-04 | Disposition: A | Discharge status: Home / Self Care | Payer: Medicare HMO | Source: Ambulatory Visit | Attending: Internal Medicine | Admitting: Internal Medicine

## 2014-03-04 VITALS — BP 100/60 | HR 66 | Temp 98.1°F | Resp 14 | Wt 167.8 lb

## 2014-03-04 DIAGNOSIS — R05 Cough: Secondary | ICD-10-CM

## 2014-03-04 DIAGNOSIS — M899 Disorder of bone, unspecified: Secondary | ICD-10-CM

## 2014-03-04 DIAGNOSIS — G8929 Other chronic pain: Secondary | ICD-10-CM

## 2014-03-04 DIAGNOSIS — J209 Acute bronchitis, unspecified: Secondary | ICD-10-CM

## 2014-03-04 DIAGNOSIS — M949 Disorder of cartilage, unspecified: Secondary | ICD-10-CM

## 2014-03-04 DIAGNOSIS — R059 Cough, unspecified: Secondary | ICD-10-CM

## 2014-03-04 DIAGNOSIS — M898X1 Other specified disorders of bone, shoulder: Secondary | ICD-10-CM

## 2014-03-04 MED ORDER — AMOXICILLIN 500 MG PO CAPS: 500.0000 mg | ORAL_CAPSULE | Freq: Three times a day (TID) | ORAL | Status: DC

## 2014-03-04 NOTE — Progress Notes (Signed)
Pre visit review using our clinic review tool, if applicable. No additional management support is needed unless otherwise documented below in the visit note. 

## 2014-03-04 NOTE — Progress Notes (Signed)
   Subjective:    Patient ID: Billy Bush, male    DOB: 1941/01/04, 73 y.o.   MRN: [id number]  HPI  He has 2 issues. He's had a  Non productive cough approximately 4 weeks. He describes paroxysmal coughing with the production of scant amounts of yellow sputum 1-2 times per day as of  4/5. The cough is improved by increased activity. Topical agents such as Vick's have been employed.  He's had associated sneezing and some discolored nasal  secretions. He's had some discomfort in the sinuses.  He smoked less than 2 packs years stopping in  either 1996 or 2007. The history is unclear  Significantly he completed his chemotherapy 4 weeks ago. He has a followup with his oncologist tomorrow  He's had pain in the scapula for 3-4 weeks. It's worse at night when he is supine ,lasting an hour or more.  There is no trigger or injury for this. He did fall 6/7 months ago on the sidewalk but there was no injury to the scapula @ that time. He takes Benadryl to help him sleep. This discomfort will decrease with movement. It is described as sharp. It is nonradiating.     Review of Systems  He has no associated weakness, numbness, or tingling in his extremities  He specifically denies fever, chills, sweats, unexplained weight loss  He has no associated chest pain, tachycardia, or dyspnea. He has no wheezing or pleuritic type pain.  He also has no incontinence of urine or stool.  He does describe bruising related to his ASA & Plavix     Objective:   Physical Exam   General appearance:good health ;well nourished; no acute distress or increased work of breathing is present.  No  lymphadenopathy about the head, neck, or axilla noted.   Eyes: No conjunctival inflammation or lid edema is present. There is no scleral icterus.He does have bilateral ptosis   Ears:  External ear exam shows no significant lesions or deformities.  Otoscopic examination reveals clear canals, tympanic membranes are intact  bilaterally without bulging, retraction, inflammation or discharge.  Nose:  External nasal examination shows no deformity or inflammation. Nasal mucosa are pink and moist without lesions or exudates. No septal dislocation or deviation.No obstruction to airflow.   Oral exam: Dental hygiene is good; lips and gums are healthy appearing.There is no oropharyngeal erythema or exudate noted. There was patulous character to his uvula  Neck:  No deformities, thyromegaly, masses, or tenderness noted.   Decreased range of motion of the neck particularly with left lateral rotation.   Heart:  Normal rate and regular rhythm. S1 and S2 normal without gallop, murmur, click, rub or other extra sounds. There is a grade 1 systolic murmur.  Lungs:Chest clear to auscultation; no wheezes, rhonchi,rales ,or rubs present.No increased work of breathing.    Extremities:  No cyanosis, edema, or clubbing  noted    Skin: Warm & dry w/o jaundice or tenting.Has diffuse ecchymoses over the upper extremities.  There was no pain with range of motion of shoulders. There is no pain to palpation or percussion over the scapulae.        Assessment & Plan:   #1 cough productive of scant yellow sputum present for weeks  #2 scapular pain, positional. This is most likely a cervical radiculopathy related to loss of the normal curvature of the cervical spine when supine  Plan: See orders

## 2014-03-04 NOTE — Telephone Encounter (Signed)
Wife left VM asking if the documents have been received from their cancer policy from [employer]?  Also were requesting medical records. What is turn-around-time for completion? Forwarded message to managed care department.

## 2014-03-04 NOTE — Patient Instructions (Signed)
Your next office appointment will be determined based upon review of your pending x-rays. Those instructions will be transmitted to you through My Chart    Followup as needed for your acute issue. Please report any significant change in your symptoms. Use a cervical memory foam pillow to prevent hyperextension or hyperflexion of the cervical spine. 

## 2014-03-05 ENCOUNTER — Other Ambulatory Visit (HOSPITAL_BASED_OUTPATIENT_CLINIC_OR_DEPARTMENT_OTHER): Payer: Medicare HMO

## 2014-03-05 DIAGNOSIS — D696 Thrombocytopenia, unspecified: Secondary | ICD-10-CM

## 2014-03-05 DIAGNOSIS — C7A1 Malignant poorly differentiated neuroendocrine tumors: Secondary | ICD-10-CM

## 2014-03-05 LAB — CBC WITH DIFFERENTIAL/PLATELET
BASO%: 0.2 % (ref 0.0–2.0)
BASOS ABS: 0 10*3/uL (ref 0.0–0.1)
EOS ABS: 0.2 10*3/uL (ref 0.0–0.5)
EOS%: 3.3 % (ref 0.0–7.0)
HCT: 27.7 % — ABNORMAL LOW (ref 38.4–49.9)
HGB: 9.1 g/dL — ABNORMAL LOW (ref 13.0–17.1)
LYMPH%: 6.4 % — AB (ref 14.0–49.0)
MCH: 32.2 pg (ref 27.2–33.4)
MCHC: 32.9 g/dL (ref 32.0–36.0)
MCV: 97.9 fL (ref 79.3–98.0)
MONO#: 0.4 10*3/uL (ref 0.1–0.9)
MONO%: 6.9 % (ref 0.0–14.0)
NEUT%: 83.2 % — ABNORMAL HIGH (ref 39.0–75.0)
NEUTROS ABS: 5.1 10*3/uL (ref 1.5–6.5)
PLATELETS: 99 10*3/uL — AB (ref 140–400)
RBC: 2.83 10*6/uL — AB (ref 4.20–5.82)
RDW: 15.2 % — AB (ref 11.0–14.6)
WBC: 6.1 10*3/uL (ref 4.0–10.3)
lymph#: 0.4 10*3/uL — ABNORMAL LOW (ref 0.9–3.3)
nRBC: 0 % (ref 0–0)

## 2014-03-05 NOTE — Telephone Encounter (Signed)
Contacted pt and informed pt we have not received anything from insurance company

## 2014-03-06 ENCOUNTER — Telehealth: Payer: Self-pay | Admitting: *Deleted

## 2014-03-06 NOTE — Telephone Encounter (Signed)
Wife called asking "would it be OK for Billy Bush to take Aleve for his back pain?"  She states that he takes Tylenol throughout the day and uses the tramadol "occasionally" but his back continue to hurt and not getting any better.  "Since it's arthritis or the back spur; would anti-flammatory be better?"  Note to Dr. Benay Spice.

## 2014-03-06 NOTE — Telephone Encounter (Signed)
Notified pt's wife per Dr. Benay Spice OK to take Aleve for back pain.  Pt's wife verbalized understanding and confirmed appt for May 18, 2014.

## 2014-03-11 ENCOUNTER — Ambulatory Visit
Admission: RE | Admit: 2014-03-11 | Discharge: 2014-03-11 | Disposition: A | Discharge status: Home / Self Care | Payer: Medicare Other | Source: Ambulatory Visit | Attending: Radiation Oncology | Admitting: Radiation Oncology

## 2014-03-11 ENCOUNTER — Other Ambulatory Visit (HOSPITAL_BASED_OUTPATIENT_CLINIC_OR_DEPARTMENT_OTHER): Payer: Medicare HMO

## 2014-03-11 ENCOUNTER — Encounter: Payer: Self-pay | Admitting: Radiation Oncology

## 2014-03-11 ENCOUNTER — Telehealth: Payer: Self-pay | Admitting: *Deleted

## 2014-03-11 VITALS — BP 110/54 | HR 64 | Temp 98.0°F | Resp 20 | Ht 67.0 in | Wt 168.8 lb

## 2014-03-11 DIAGNOSIS — D649 Anemia, unspecified: Secondary | ICD-10-CM

## 2014-03-11 DIAGNOSIS — C7A1 Malignant poorly differentiated neuroendocrine tumors: Secondary | ICD-10-CM

## 2014-03-11 DIAGNOSIS — C155 Malignant neoplasm of lower third of esophagus: Secondary | ICD-10-CM

## 2014-03-11 LAB — CBC WITH DIFFERENTIAL/PLATELET
BASO%: 0.4 % (ref 0.0–2.0)
Basophils Absolute: 0 10*3/uL (ref 0.0–0.1)
EOS%: 2.4 % (ref 0.0–7.0)
Eosinophils Absolute: 0.2 10*3/uL (ref 0.0–0.5)
HEMATOCRIT: 27.2 % — AB (ref 38.4–49.9)
HGB: 8.9 g/dL — ABNORMAL LOW (ref 13.0–17.1)
LYMPH%: 7.9 % — ABNORMAL LOW (ref 14.0–49.0)
MCH: 31.8 pg (ref 27.2–33.4)
MCHC: 32.7 g/dL (ref 32.0–36.0)
MCV: 97.1 fL (ref 79.3–98.0)
MONO#: 0.6 10*3/uL (ref 0.1–0.9)
MONO%: 8.2 % (ref 0.0–14.0)
NEUT#: 5.7 10*3/uL (ref 1.5–6.5)
NEUT%: 81.1 % — AB (ref 39.0–75.0)
NRBC: 0 % (ref 0–0)
PLATELETS: 114 10*3/uL — AB (ref 140–400)
RBC: 2.8 10*6/uL — AB (ref 4.20–5.82)
RDW: 14.9 % — ABNORMAL HIGH (ref 11.0–14.6)
WBC: 7.1 10*3/uL (ref 4.0–10.3)
lymph#: 0.6 10*3/uL — ABNORMAL LOW (ref 0.9–3.3)

## 2014-03-11 NOTE — Telephone Encounter (Signed)
Spoke with pt in the lobby.  Showed pt lab results today with platelet count  114.  Informed pt that he did not require any interventions today as per Lavella Lemons, desk nurse.   Pt aware of next appt with Dr. Benay Spice in June.

## 2014-03-11 NOTE — Progress Notes (Addendum)
Follow up  S/p rad tx esophagus 08/06/13-09/12/13, pain c/o shoulder blades and upper back, 4/10 scale, no nausea, good appetite, pain mostly at night,  CXR chest 03/04/14, ct 02/09/14 results in, no difficulty swallowing, on amoxicilin 03/04/14,coughing up yellow/greenish phelgm past 3-4 days chest/sinus , energy level good stated patient 1:45 PM

## 2014-03-12 NOTE — Progress Notes (Signed)
Radiation Oncology         [phone] ________________________________  Name: Billy Bush MRN: [id number]  Date: 03/11/2014  DOB: July 02, 1941  Follow-Up Visit Note  CC: Renato Shin, MD  Renato Shin, MD  Diagnosis:   Neuroendocrine carcinoma of the esophagus  Interval Since Last Radiation:  7 months   Narrative:  The patient returns today for routine follow-up.  The patient states that he is feeling well today. He states he has a good appetite and is swallowing well. No pain or substantial difficulty with swallowing. The patient has had some ongoing back pain. He states that this actually got better during chemotherapy but has been more prominent recently. The patient has undergone a chest x-ray and a CT scan in recent weeks. No clear sign of metastatic disease or progression. He did have some mediastinal nodes of uncertain significance and these will continue to be followed.                              ALLERGIES:  is allergic to ramipril.  Meds: Current Outpatient Prescriptions  Medication Sig Dispense Refill  . acetaminophen (TYLENOL) 325 MG tablet Take 650 mg by mouth every 6 (six) hours as needed.      Marland Kitchen amoxicillin (AMOXIL) 500 MG capsule Take 1 capsule (500 mg total) by mouth 3 (three) times daily.  21 capsule  0  . aspirin EC 81 MG tablet Take 1 tablet (81 mg total) by mouth daily.      Marland Kitchen atorvastatin (LIPITOR) 80 MG tablet TAKE 1 TABLET AT BEDTIME  90 tablet  0  . carvedilol (COREG) 12.5 MG tablet Take 1 tablet (12.5 mg total) by mouth 2 (two) times daily with a meal.  180 tablet  1  . citalopram (CELEXA) 10 MG tablet Take 1 tablet (10 mg total) by mouth daily.  90 tablet  prn  . clopidogrel (PLAVIX) 75 MG tablet Take 1 tablet (75 mg total) by mouth daily.      Marland Kitchen co-enzyme Q-10 30 MG capsule Take 30 mg by mouth daily.        . furosemide (LASIX) 80 MG tablet 1 tablet (80 mg)in the AM and 1/2 tablet(40mg ) in the PM  135 tablet  3  . glucose blood (ONE TOUCH ULTRA TEST)  test strip Use as directed twice daily dx 250.01  200 each  0  . insulin lispro (HUMALOG) 100 UNIT/ML injection Inject into the skin 3x a day (just before each meal) 04-30-13 units      . Insulin Pen Needle (BD PEN NEEDLE NANO U/F) 32G X 4 MM MISC Use as directed four times daily  400 each  3  . losartan (COZAAR) 50 MG tablet 1/2 tablet (total 25mg ) daily  45 tablet  3  . Multiple Vitamin (MULTIVITAMIN) tablet Take 1 tablet by mouth daily.        . naproxen sodium (ANAPROX) 220 MG tablet Take 220 mg by mouth as needed.      . prochlorperazine (COMPAZINE) 10 MG tablet Take 10 mg by mouth every 6 (six) hours as needed for nausea or vomiting.      Marland Kitchen spironolactone (ALDACTONE) 25 MG tablet 1 tablet daily  90 tablet  1  . traMADol (ULTRAM) 50 MG tablet Take 1 tablet (50 mg total) by mouth every 12 (twelve) hours as needed.  30 tablet  0  . nitroGLYCERIN (NITROSTAT) 0.4 MG SL tablet Place 1  tablet (0.4 mg total) under the tongue every 5 (five) minutes as needed for chest pain.  25 tablet  3  . potassium chloride SA (K-DUR,KLOR-CON) 20 MEQ tablet Take 1 tablet (20 mEq total) by mouth daily.  90 tablet  1   No current facility-administered medications for this encounter.    Physical Findings: The patient is in no acute distress. Patient is alert and oriented.  height is 5\' 7"  (1.702 m) and weight is 168 lb 12.8 oz (76.567 kg). His oral temperature is 98 F (36.7 C). His blood pressure is 110/54 and his pulse is 64. His respiration is 20 and oxygen saturation is 100%. .   General: Well-developed, in no acute distress HEENT: Normocephalic, atraumatic Cardiovascular: Regular rate and rhythm Respiratory: Clear to auscultation bilaterally GI: Soft, nontender, normal bowel sounds Extremities: No edema present   Lab Findings: Lab Results  Component Value Date   WBC 7.1 03/11/2014   HGB 8.9* 03/11/2014   HCT 27.2* 03/11/2014   MCV 97.1 03/11/2014   PLT 114* 03/11/2014     Radiographic Findings: Dg  Chest 2 View  03/04/2014   CLINICAL DATA:  Persisting cough for 3 weeks, former smoker, history hypertension, diabetes, coronary artery disease, CHF, esophageal cancer, prior radiation therapy  EXAM: CHEST  2 VIEW  COMPARISON:  CT chest 02/09/2014  FINDINGS: Tip of left subclavian AICD lead projects at right ventricle.  Enlargement of cardiac silhouette post CABG.  Mediastinal contours and pulmonary vascularity normal.  Thickening at base of right major fissure unchanged.  Lungs mildly hyperinflated but clear.  No pleural effusion or pneumothorax.  Prominent left first costochondral junction unchanged.  Mild endplate spur formation thoracic spine.  IMPRESSION: Enlargement of cardiac silhouette post CABG and AICD.  No acute abnormalities.   Electronically Signed   By: Lavonia Dana M.D.   On: 03/04/2014 13:29    Impression:    The patient is doing well clinically at this time. He is swallowing well and does not have any clear evidence of progression currently. He is currently on observation and will be seen by medical oncology again in June.  Plan:  We discussed followup options. He will return to our clinic on a when necessary basis.   Jodelle Gross, M.D., Ph.D.

## 2014-03-16 ENCOUNTER — Ambulatory Visit (INDEPENDENT_AMBULATORY_CARE_PROVIDER_SITE_OTHER): Payer: Medicare HMO | Admitting: Endocrinology

## 2014-03-16 ENCOUNTER — Ambulatory Visit
Admission: RE | Admit: 2014-03-16 | Discharge: 2014-03-16 | Disposition: A | Discharge status: Home / Self Care | Payer: Medicare Other | Source: Ambulatory Visit | Attending: Endocrinology | Admitting: Endocrinology

## 2014-03-16 ENCOUNTER — Encounter: Payer: Self-pay | Admitting: Endocrinology

## 2014-03-16 VITALS — BP 112/70 | HR 72 | Temp 97.4°F | Ht 67.0 in | Wt 167.0 lb

## 2014-03-16 DIAGNOSIS — E1039 Type 1 diabetes mellitus with other diabetic ophthalmic complication: Secondary | ICD-10-CM

## 2014-03-16 DIAGNOSIS — Z0000 Encounter for general adult medical examination without abnormal findings: Secondary | ICD-10-CM

## 2014-03-16 DIAGNOSIS — E785 Hyperlipidemia, unspecified: Secondary | ICD-10-CM

## 2014-03-16 DIAGNOSIS — Z79899 Other long term (current) drug therapy: Secondary | ICD-10-CM

## 2014-03-16 DIAGNOSIS — M545 Low back pain, unspecified: Secondary | ICD-10-CM

## 2014-03-16 DIAGNOSIS — Z125 Encounter for screening for malignant neoplasm of prostate: Secondary | ICD-10-CM

## 2014-03-16 LAB — URINALYSIS, ROUTINE W REFLEX MICROSCOPIC
Bilirubin Urine: NEGATIVE
Hgb urine dipstick: NEGATIVE
KETONES UR: NEGATIVE
LEUKOCYTES UA: NEGATIVE
Nitrite: NEGATIVE
PH: 6 (ref 5.0–8.0)
SPECIFIC GRAVITY, URINE: 1.015 (ref 1.000–1.030)
Total Protein, Urine: NEGATIVE
Urine Glucose: NEGATIVE
Urobilinogen, UA: 1 (ref 0.0–1.0)

## 2014-03-16 LAB — MICROALBUMIN / CREATININE URINE RATIO
CREATININE, U: 44.2 mg/dL
MICROALB UR: 0.6 mg/dL (ref 0.0–1.9)
MICROALB/CREAT RATIO: 1.4 mg/g (ref 0.0–30.0)

## 2014-03-16 LAB — LIPID PANEL
CHOL/HDL RATIO: 3
Cholesterol: 127 mg/dL (ref 0–200)
HDL: 49.6 mg/dL (ref >39.00)
LDL Cholesterol: 63 mg/dL (ref 0–99)
TRIGLYCERIDES: 72 mg/dL (ref 0.0–149.0)
VLDL: 14.4 mg/dL (ref 0.0–40.0)

## 2014-03-16 LAB — HEMOGLOBIN A1C: Hgb A1c MFr Bld: 6.5 % (ref 4.6–6.5)

## 2014-03-16 LAB — PSA: PSA: 3.04 ng/mL (ref 0.10–4.00)

## 2014-03-16 LAB — TSH: TSH: 2.94 u[IU]/mL (ref 0.35–5.50)

## 2014-03-16 MED ORDER — HYDROCODONE-ACETAMINOPHEN 10-325 MG PO TABS: 1.0000 | ORAL_TABLET | ORAL | Status: DC | PRN

## 2014-03-16 NOTE — Progress Notes (Signed)
Subjective:    Patient ID: Billy Bush, male    DOB: [birth year], 73 y.o.   MRN: [id number]  HPI Pt is here for regular wellness examination, and is feeling pretty well in general, and says chronic med probs are stable, except as noted below Past Medical History  Diagnosis Date  . Dyslipidemia   . Hypertension   . History of colonic polyps   . Thrombocytopenia   . Nonproliferative diabetic retinopathy NOS(362.03)   . Impotence of organic origin   . Iron deficiency anemia, unspecified   . Unspecified hereditary and idiopathic peripheral neuropathy   . Diabetes mellitus   . Depressive disorder, not elsewhere classified   . Coronary artery disease     a. s/p CABG 1997; b. LHC 04/2007: Distal left main 80% treated with PCI (Cypher DES), LIMA-LAD patent, SVG-OM patent, SVG-PDA patent, EF 30% with inferior HK  . Chronic systolic heart failure     a. Echo 09/2011: EF 30-35%, apical AK, basal inferior AK, mild AI, moderate MR, moderate LAE  . Anxiety state, unspecified   . Ischemic cardiomyopathy   . S/P ICD (internal cardiac defibrillator) procedure   . Esophageal cancer   . Elevated PSA   . History of radiation therapy 08/06/13-09/12/13    esophageal    Past Surgical History  Procedure Laterality Date  . Coronary artery bypass graft      X 3  . Cardiac catheterization  05/23/07  . Insert / replace / remove pacemaker  07/05/07    [employer] SINGLE-CHAMBER DEFIBRILLATOR, Cristopher Peru, MD  . Esophagogastroduodenoscopy N/A 07/08/2013    Procedure: ESOPHAGOGASTRODUODENOSCOPY (EGD) with possible Dilation.;  Surgeon: Inda Castle, MD;  Location: [facility];  Service: Endoscopy;  Laterality: N/A;    History   Social History  . Marital Status: Married    Spouse Name: N/A    Number of Children: 4  . Years of Education: N/A   Occupational History  . PASTOR    Social History Main Topics  . Smoking status: Former Smoker -- 2 years    Types: Cigarettes    Quit date: 11/27/1994    . Smokeless tobacco: Never Used  . Alcohol Use: No  . Drug Use: No  . Sexual Activity: Not on file   Other Topics Concern  . Not on file   Social History Narrative   [employer]   MARRIED   4 BOYS   FORMER SMOKER, QUIT 14 YRS   ALCOHOL USE -NO   NO ILLICIT DRUG USE         ICD-[employer] [facility]      PATIENT SIGNED A DESIGNATED PARTY RELEASE TO ALLOW HIS WIFE, [name], TO HAVE ACCESS TO HIS MEDICAL RECORDS/INFORMATION.    [name], March 21, 2010 9:42 AM    Current Outpatient Prescriptions on File Prior to Visit  Medication Sig Dispense Refill  . acetaminophen (TYLENOL) 325 MG tablet Take 650 mg by mouth every 6 (six) hours as needed.      Marland Kitchen aspirin EC 81 MG tablet Take 1 tablet (81 mg total) by mouth daily.      Marland Kitchen atorvastatin (LIPITOR) 80 MG tablet TAKE 1 TABLET AT BEDTIME  90 tablet  0  . carvedilol (COREG) 12.5 MG tablet Take 1 tablet (12.5 mg total) by mouth 2 (two) times daily with a meal.  180 tablet  1  . citalopram (CELEXA) 10 MG tablet Take 1 tablet (10 mg total) by mouth daily.  90 tablet  prn  . clopidogrel (PLAVIX) 75 MG tablet Take 1 tablet (75 mg total) by mouth daily.      Marland Kitchen co-enzyme Q-10 30 MG capsule Take 30 mg by mouth daily.        . furosemide (LASIX) 80 MG tablet 1 tablet (80 mg)in the AM and 1/2 tablet(40mg ) in the PM  135 tablet  3  . glucose blood (ONE TOUCH ULTRA TEST) test strip Use as directed twice daily dx 250.01  200 each  0  . insulin lispro (HUMALOG) 100 UNIT/ML injection Inject into the skin 3x a day (just before each meal) 04-30-13 units      . Insulin Pen Needle (BD PEN NEEDLE NANO U/F) 32G X 4 MM MISC Use as directed four times daily  400 each  3  . losartan (COZAAR) 50 MG tablet 1/2 tablet (total 25mg ) daily  45 tablet  3  . Multiple Vitamin (MULTIVITAMIN) tablet Take 1 tablet by mouth daily.        . nitroGLYCERIN (NITROSTAT) 0.4 MG SL tablet Place 1 tablet (0.4 mg total) under the tongue every 5 (five) minutes as needed for chest pain.   25 tablet  3  . prochlorperazine (COMPAZINE) 10 MG tablet Take 10 mg by mouth every 6 (six) hours as needed for nausea or vomiting.      . potassium chloride SA (K-DUR,KLOR-CON) 20 MEQ tablet Take 1 tablet (20 mEq total) by mouth daily.  90 tablet  1  . spironolactone (ALDACTONE) 25 MG tablet 1 tablet daily  90 tablet  1   No current facility-administered medications on file prior to visit.   Allergies  Allergen Reactions  . Ramipril Other (See Comments)    cough   No family history on file.  BP 112/70  Pulse 72  Temp(Src) 97.4 F (36.3 C) (Oral)  Ht 5\' 7"  (1.702 m)  Wt 167 lb (75.751 kg)  BMI 26.15 kg/m2  SpO2 97%  Review of Systems  Constitutional: Negative for fever and unexpected weight change.  HENT: Negative for hearing loss.   Eyes: Negative for visual disturbance.  Respiratory: Negative for shortness of breath.   Cardiovascular: Negative for chest pain.  Gastrointestinal: Negative for anal bleeding.  Endocrine: Negative for cold intolerance.  Genitourinary: Negative for hematuria.  Musculoskeletal: Negative for gait problem.  Skin: Negative for rash.  Allergic/Immunologic: Negative for environmental allergies.  Neurological: Negative for syncope and numbness.  Hematological: Bruises/bleeds easily.  Psychiatric/Behavioral: Negative for dysphoric mood.       Objective:   Physical Exam VS: see vs page GEN: no distress HEAD: head: no deformity eyes: no periorbital swelling, no proptosis external nose and ears are normal mouth: no lesion seen NECK: supple, thyroid is not enlarged CHEST WALL: no deformity LUNGS: clear to auscultation BREASTS:  No gynecomastia CV: reg rate and rhythm, no murmur ABD: abdomen is soft, nontender.  no hepatosplenomegaly.  not distended.  no hernia RECTAL: normal external and internal exam.  heme neg. PROSTATE:  Normal size.  No nodule MUSCULOSKELETAL: muscle bulk and strength are grossly normal.  no obvious joint swelling.  gait  is normal and steady. PULSES: no carotid bruit. NEURO:  cn 2-12 grossly intact.   readily moves all 4's.  SKIN:  Normal texture and temperature.  No rash or suspicious lesion is visible.   NODES:  None palpable at the neck PSYCH: alert, well-oriented.  Does not appear anxious nor depressed.       Assessment & Plan:  Wellness visit  today, with problems stable, except as noted. we discussed code status.  pt requests full code, but would not want to be started or maintained on artificial life-support measures if there was not a reasonable chance of recovery.     SEPARATE EVALUATION FOLLOWS--EACH PROBLEM HERE IS NEW, NOT RESPONDING TO TREATMENT, OR POSES SIGNIFICANT RISK TO THE PATIENT'S HEALTH: HISTORY OF THE PRESENT ILLNESS: Pt states 3 weeks of moderate pain at the lower back, but no assoc numbness.  Tramadol helps only temporarily.   PAST MEDICAL HISTORY reviewed and up to date today REVIEW OF SYSTEMS: Cough is much better.  Denies bowel or bladder retention.   PHYSICAL EXAMINATION: VITAL SIGNS:  See vs page GENERAL: no distress Spine: nontender.   LAB/XRAY RESULTS: L-spine: OA CXR: NAD IMPRESSION: Low-back pain, due to OA.   The pain needs increased rx Acute bronchitis, much better PLAN: See instruction page

## 2014-03-16 NOTE — Patient Instructions (Addendum)
please consider these measures for your health:  minimize alcohol.  do not use tobacco products.  have a colonoscopy at least every 10 years from age 73.  keep firearms safely stored.  always use seat belts.  have working smoke alarms in your home.  see an eye doctor and dentist regularly.  never drive under the influence of alcohol or drugs (including prescription drugs).  those with fair skin should take precautions against the sun. it is critically important to prevent falling down (keep floor areas well-lit, dry, and free of loose objects.  If you have a cane, walker, or wheelchair, you should use it, even for short trips around the house.  Also, try not to rush).   Here is a prescription for a stronger pain pill.   blood tests and x-rays, are being requested for you today.  We'll contact you with results.   I hope you feel better soon.  If you don't feel better by next week, please call back.   Please come back for a follow-up appointment in 3 months.

## 2014-03-25 NOTE — Progress Notes (Signed)
Patient ID: Billy Bush, male   DOB: [birth year], 73 y.o.   MRN: [id number] Erroneous encounter visit was canceled patient was not seen

## 2014-03-27 ENCOUNTER — Ambulatory Visit (INDEPENDENT_AMBULATORY_CARE_PROVIDER_SITE_OTHER): Payer: Medicare HMO | Admitting: Endocrinology

## 2014-03-27 ENCOUNTER — Encounter: Payer: Self-pay | Admitting: Endocrinology

## 2014-03-27 VITALS — BP 114/60 | HR 60 | Temp 97.9°F | Ht 67.0 in | Wt 165.0 lb

## 2014-03-27 DIAGNOSIS — M545 Low back pain, unspecified: Secondary | ICD-10-CM

## 2014-03-27 MED ORDER — OXYCODONE-ACETAMINOPHEN 10-325 MG PO TABS: 1.0000 | ORAL_TABLET | ORAL | Status: DC | PRN

## 2014-03-27 NOTE — Progress Notes (Signed)
Subjective:    Patient ID: Billy Bush, male    DOB: Nov 20, 1941, 73 y.o.   MRN: [id number]  HPI Pt reports 1 month of moderate pain at the upper back, lower back, and left shoulder.  No assoc numbness.  vicodin helped at first, but not since.   Past Medical History  Diagnosis Date  . Dyslipidemia   . Hypertension   . History of colonic polyps   . Thrombocytopenia   . Nonproliferative diabetic retinopathy NOS(362.03)   . Impotence of organic origin   . Iron deficiency anemia, unspecified   . Unspecified hereditary and idiopathic peripheral neuropathy   . Diabetes mellitus   . Depressive disorder, not elsewhere classified   . Coronary artery disease     a. s/p CABG 1997; b. LHC 04/2007: Distal left main 80% treated with PCI (Cypher DES), LIMA-LAD patent, SVG-OM patent, SVG-PDA patent, EF 30% with inferior HK  . Chronic systolic heart failure     a. Echo 09/2011: EF 30-35%, apical AK, basal inferior AK, mild AI, moderate MR, moderate LAE  . Anxiety state, unspecified   . Ischemic cardiomyopathy   . S/P ICD (internal cardiac defibrillator) procedure   . Esophageal cancer   . Elevated PSA   . History of radiation therapy 08/06/13-09/12/13    esophageal    Past Surgical History  Procedure Laterality Date  . Coronary artery bypass graft      X 3  . Cardiac catheterization  05/23/07  . Insert / replace / remove pacemaker  07/05/07    [employer] JUDE SINGLE-CHAMBER DEFIBRILLATOR, Billy Peru, MD  . Esophagogastroduodenoscopy N/A 07/08/2013    Procedure: ESOPHAGOGASTRODUODENOSCOPY (EGD) with possible Dilation.;  Surgeon: Billy Castle, MD;  Location: [facility];  Service: Endoscopy;  Laterality: N/A;    History   Social History  . Marital Status: Married    Spouse Name: N/A    Number of Children: 4  . Years of Education: N/A   Occupational History  . Bush    Social History Main Topics  . Smoking status: Former Smoker -- 2 years    Types: Cigarettes    Quit date:  11/27/1994  . Smokeless tobacco: Never Used  . Alcohol Use: No  . Drug Use: No  . Sexual Activity: Not on file   Other Topics Concern  . Not on file   Social History Narrative   Billy Bush   MARRIED   4 BOYS   FORMER SMOKER, QUIT 14 YRS   ALCOHOL USE -NO   NO ILLICIT DRUG USE         ICD-St. JUDE      PATIENT SIGNED A DESIGNATED PARTY RELEASE TO ALLOW HIS WIFE, Billy Bush, TO HAVE ACCESS TO HIS MEDICAL RECORDS/INFORMATION.    Billy Bush    Current Outpatient Prescriptions on File Prior to Visit  Medication Sig Dispense Refill  . acetaminophen (TYLENOL) 325 MG tablet Take 650 mg by mouth every 6 (six) hours as needed.      Marland Kitchen aspirin EC 81 MG tablet Take 1 tablet (81 mg total) by mouth daily.      Marland Kitchen atorvastatin (LIPITOR) 80 MG tablet TAKE 1 TABLET AT BEDTIME  90 tablet  0  . carvedilol (COREG) 12.5 MG tablet Take 1 tablet (12.5 mg total) by mouth 2 (two) times daily with a meal.  180 tablet  1  . citalopram (CELEXA) 10 MG tablet Take 1 tablet (10 mg total) by mouth daily.  90 tablet  prn  . clopidogrel (PLAVIX) 75 MG tablet Take 1 tablet (75 mg total) by mouth daily.      Marland Kitchen co-enzyme Q-10 30 MG capsule Take 30 mg by mouth daily.        . furosemide (LASIX) 80 MG tablet 1 tablet (80 mg)in the Bush and 1/2 tablet(40mg ) in the PM  135 tablet  3  . glucose blood (ONE TOUCH ULTRA TEST) test strip Use as directed twice daily dx 250.01  200 each  0  . insulin lispro (HUMALOG) 100 UNIT/ML injection Inject into the skin 3x a day (just before each meal) 04-30-13 units      . Insulin Pen Needle (BD PEN NEEDLE NANO U/F) 32G X 4 MM MISC Use as directed four times daily  400 each  3  . losartan (COZAAR) 50 MG tablet 1/2 tablet (total 25mg ) daily  45 tablet  3  . Multiple Vitamin (MULTIVITAMIN) tablet Take 1 tablet by mouth daily.        . nitroGLYCERIN (NITROSTAT) 0.4 MG SL tablet Place 1 tablet (0.4 mg total) under the tongue every 5 (five) minutes as needed for  chest pain.  25 tablet  3  . potassium chloride SA (K-DUR,KLOR-CON) 20 MEQ tablet Take 1 tablet (20 mEq total) by mouth daily.  90 tablet  1  . prochlorperazine (COMPAZINE) 10 MG tablet Take 10 mg by mouth every 6 (six) hours as needed for nausea or vomiting.      Marland Kitchen spironolactone (ALDACTONE) 25 MG tablet 1 tablet daily  90 tablet  1   No current facility-administered medications on file prior to visit.    Allergies  Allergen Reactions  . Ramipril Other (See Comments)    cough    No family history on file.  BP 114/60  Pulse 60  Temp(Src) 97.9 F (36.6 C) (Oral)  Ht 5\' 7"  (1.702 m)  Wt 165 lb (74.844 kg)  BMI 25.84 kg/m2  Review of Systems He has lost weight.  No falls.      Objective:   Physical Exam VITAL SIGNS:  See vs page GENERAL: no distress Left shoulder: full rom, but rom is slightly painful.    CXR: no abnormality of the T-spine is mentioned.    Assessment & Plan:  Upper back pain, new. Shoulder pain, new Low-back pain, he needs increased rx Weight loss, due to cancer.

## 2014-03-27 NOTE — Patient Instructions (Addendum)
Here is a prescription, for a stronger pain pill.   Call if you want to see a specialist for this.

## 2014-04-01 ENCOUNTER — Encounter: Payer: Self-pay | Admitting: Endocrinology

## 2014-04-02 ENCOUNTER — Ambulatory Visit (INDEPENDENT_AMBULATORY_CARE_PROVIDER_SITE_OTHER): Payer: Medicare HMO | Admitting: *Deleted

## 2014-04-02 ENCOUNTER — Telehealth: Payer: Self-pay | Admitting: *Deleted

## 2014-04-02 DIAGNOSIS — I2589 Other forms of chronic ischemic heart disease: Secondary | ICD-10-CM

## 2014-04-02 DIAGNOSIS — I5022 Chronic systolic (congestive) heart failure: Secondary | ICD-10-CM

## 2014-04-02 LAB — MDC_IDC_ENUM_SESS_TYPE_INCLINIC
Battery Remaining Longevity: 51.6 mo
Battery Voltage: 2.57 V
Brady Statistic RV Percent Paced: 0.12 %
Date Time Interrogation Session: 20150507095649
HIGH POWER IMPEDANCE MEASURED VALUE: 35.1711
Implantable Pulse Generator Serial Number: 484050
Lead Channel Impedance Value: 312.5 Ohm
Lead Channel Pacing Threshold Amplitude: 0.75 V
Lead Channel Pacing Threshold Pulse Width: 0.5 ms
Lead Channel Sensing Intrinsic Amplitude: 8.7 mV
MDC IDC MSMT LEADCHNL RV PACING THRESHOLD AMPLITUDE: 0.75 V
MDC IDC MSMT LEADCHNL RV PACING THRESHOLD PULSEWIDTH: 0.5 ms
MDC IDC SET LEADCHNL RV PACING AMPLITUDE: 2.5 V
MDC IDC SET LEADCHNL RV PACING PULSEWIDTH: 0.5 ms
MDC IDC SET LEADCHNL RV SENSING SENSITIVITY: 0.3 mV
MDC IDC SET ZONE DETECTION INTERVAL: 250 ms
MDC IDC SET ZONE DETECTION INTERVAL: 340 ms
Zone Setting Detection Interval: 300 ms

## 2014-04-02 NOTE — Progress Notes (Signed)
ICD check in clinic. Normal device function. Threshold and sensing consistent with previous device measurements. Impedance trends stable over time. No evidence of any ventricular arrhythmias. Histogram distribution appropriate for patient and level of activity. No changes made this session. Device programmed at appropriate safety margins. Device programmed to optimize intrinsic conduction. Estimated longevity 4.2 years. Plan to follow up with GT on 8-13 @ 9:30am.

## 2014-04-02 NOTE — Telephone Encounter (Signed)
Wife called to report 2 week history of dysphagia and voice very hoarse. Asking to be seen by Dr. Benay Spice tomorrow. Per Dr. Benay Spice, needs to see Dr. Deatra Ina 1st. Made appointment for him to see Wallace Going, PA tomorrow at 0900. Wife appreciates the appointment and they will be there.

## 2014-04-03 ENCOUNTER — Encounter: Payer: Self-pay | Admitting: Nurse Practitioner

## 2014-04-03 ENCOUNTER — Ambulatory Visit (INDEPENDENT_AMBULATORY_CARE_PROVIDER_SITE_OTHER): Payer: Medicare HMO | Admitting: Nurse Practitioner

## 2014-04-03 ENCOUNTER — Telehealth: Payer: Self-pay | Admitting: *Deleted

## 2014-04-03 VITALS — BP 108/70 | HR 62 | Ht 67.0 in | Wt 159.0 lb

## 2014-04-03 DIAGNOSIS — C155 Malignant neoplasm of lower third of esophagus: Secondary | ICD-10-CM

## 2014-04-03 DIAGNOSIS — R1319 Other dysphagia: Secondary | ICD-10-CM | POA: Insufficient documentation

## 2014-04-03 NOTE — Telephone Encounter (Signed)
Explained to patient and his wife do not take the Humalog evening dose and the morning dose. Wife stated patient is off oral diabetic medication. Patient and wife verbalized understanding.

## 2014-04-03 NOTE — Telephone Encounter (Signed)
LEFT MESSAGE ON MACHINE FOR PATIENT TO CALL OFFICE BACK SO WE CAN GIVE HIM HIS DIABETIC INSTRUCTIONS    _ x _   INSULIN (SHORT ACTING) MEDICATION INSTRUCTIONS (Regular, Humulog, Novolog)   The day before your procedure:  Do not take your evening dose   The day of your procedure:  Do not take your morning dose .

## 2014-04-03 NOTE — Progress Notes (Signed)
     History of Present Illness:  Patient is a 89 old white male known to Dr. Deatra Ina. He has an extensive cardiac history, s/p defrillator placement, CABG with stent placement. He is on multiple medications, including Plavix. Patient was seen in July of last year with dysphasia. EGD revealed esophageal cancer. Patient is followed by Dr. Benay Spice for high-grade neuroendocrine carcinoma of the esophagus. Staging PET scan 07/23/2013 showed a large distal esophageal mass , metastatic upper abdominal lymphadenopathy, indeterminate small pulmonary nodules.Patient underwent chemoradiation. Last chemotherapy was in February of this year. Wife tells me he is in remission.  Patient comes in today for evaluation of dysphagia. Patient has had occasional problems swallowing solids over the last several months but in the last 2 weeks it has really progressed. Meats and breads have been very problematic but now even soft foods are problematic. Weight is down 9 pounds in 2 weeks. No odynophagia. Patient has had 2 recent bouts of laryngitis, he is hoarse today.   Current Medications, Allergies, Past Medical History, Past Surgical History, Family History and Social History were reviewed in [employer] record.  Physical Exam: General: Pleasant, well developed , white male in no acute distress Head: Normocephalic and atraumatic Eyes:  sclerae anicteric, conjunctiva pink  Ears: Normal auditory acuity Lungs: Clear throughout to auscultation Heart: Regular rate and rhythm Abdomen: Soft, non distended, non-tender. Patient has back pain, difficult to lay on exam table so abdominal exam was limited. Normal bowel sounds Musculoskeletal: Symmetrical with no gross deformities  Extremities: Trace BLE edema  Neurological: Alert oriented x 4, grossly nonfocal Psychological:  Alert and cooperative. Normal mood and affect  Assessment and Recommendations:  1. Dysphagia. Patient is s/p chemoxrt for  esophageal cancer (last chemo was February 2015). Restaging CTscan in March showed that esophageal tumor had responded but there was new upper thoracic adenopathy consistent with progressive disease. I suspect patient may have a radiation induced stricture. For further evaluation and treatment patient will be scheduled for upper endoscopy with probable dilation  2. Extensive cardiac history, s/p defrillator placement, CABG with stent placement. On chronic Plavix. We will contact prescribing provider to make sure Plavix can be held for EGD.  Marland Kitchen

## 2014-04-03 NOTE — Patient Instructions (Addendum)
You have been scheduled for an endoscopy with Dr. Deatra Ina at [facility]. Please follow written instructions given to you at your visit today. If you use inhalers (even only as needed), please bring them with you on the day of your procedure. Your physician has requested that you go to www.startemmi.com and enter the access code given to you at your visit today. This web site gives a general overview about your procedure. However, you should still follow specific instructions given to you by our office regarding your preparation for the procedure.  Per Dr. Aundra Dubin okay to hold Plavix five days before procedure

## 2014-04-03 NOTE — Telephone Encounter (Signed)
May hold Plavix for endoscopy then restart

## 2014-04-03 NOTE — Telephone Encounter (Signed)
04/03/2014   RE: Billy Bush DOB: [birth year] MRN: [id number]    Dear Dr. Aundra Dubin,    We have scheduled the above patient for an Upper Endoscopy. Our records show that he is on anticoagulation therapy.   Please advise as to how long the patient may come off his therapy of Plavix prior to the procedure, which is scheduled for 04-09-2014.  Please fax back/ or route the completed form to Evette Georges., CMA at [phone].   Sincerely,    Carlyle Dolly

## 2014-04-06 ENCOUNTER — Telehealth: Payer: Self-pay | Admitting: *Deleted

## 2014-04-06 DIAGNOSIS — M25519 Pain in unspecified shoulder: Secondary | ICD-10-CM | POA: Insufficient documentation

## 2014-04-06 NOTE — Telephone Encounter (Signed)
Pt informed

## 2014-04-06 NOTE — Telephone Encounter (Signed)
Pt was recently seen for back and shoulder pain. Since OV pt states the pain has gotten worse and would like to be referred to pain management.  Please advise, Thanks!

## 2014-04-06 NOTE — Progress Notes (Signed)
Reviewed and agree with management. Please be sure to schedule dilation with a balloon dilator at the hospital Billy Bush D. Deatra Ina, M.D., [facility]

## 2014-04-06 NOTE — Telephone Encounter (Signed)
Done. you will receive a phone call, about a day and time for an appointment 

## 2014-04-08 ENCOUNTER — Telehealth: Payer: Self-pay | Admitting: Endocrinology

## 2014-04-08 ENCOUNTER — Encounter: Payer: Self-pay | Admitting: Gastroenterology

## 2014-04-08 MED ORDER — HYDROCODONE-ACETAMINOPHEN 10-325 MG PO TABS: 1.0000 | ORAL_TABLET | ORAL | Status: DC | PRN

## 2014-04-08 NOTE — Telephone Encounter (Signed)
i printed 

## 2014-04-08 NOTE — Telephone Encounter (Signed)
Left message informing that Rx is ready for pick up. Script placed up front.

## 2014-04-08 NOTE — Telephone Encounter (Signed)
Pt is requesting refill on Oxycodone. PT was last seen and medication was last filled on 03/27/2014.  Pt is still waiting to here from pain management.  Please advise, Thanks!

## 2014-04-08 NOTE — Telephone Encounter (Signed)
Patients wife called stating they spoke with nurse or Dr. Loanne Drilling regarding referral to pain management clinic   Can you advise them please  Dr. Loanne Drilling wrote Rx for percocet (generic) patient wife counted pills and they will not have enough   Call back: [phone]  Can Dr. Loanne Drilling write another percocet Rx for the weekend  Thank You :)

## 2014-04-09 ENCOUNTER — Other Ambulatory Visit: Payer: Self-pay | Admitting: *Deleted

## 2014-04-09 ENCOUNTER — Encounter (HOSPITAL_COMMUNITY): Payer: Self-pay

## 2014-04-09 ENCOUNTER — Encounter (HOSPITAL_COMMUNITY)
Admission: RE | Disposition: A | Discharge status: Home / Self Care | Payer: Self-pay | Source: Ambulatory Visit | Attending: Gastroenterology

## 2014-04-09 ENCOUNTER — Ambulatory Visit (HOSPITAL_COMMUNITY)
Admission: RE | Admit: 2014-04-09 | Discharge: 2014-04-09 | Disposition: A | Discharge status: Home / Self Care | Payer: Medicare HMO | Source: Ambulatory Visit | Attending: Gastroenterology | Admitting: Gastroenterology

## 2014-04-09 DIAGNOSIS — Z7902 Long term (current) use of antithrombotics/antiplatelets: Secondary | ICD-10-CM | POA: Insufficient documentation

## 2014-04-09 DIAGNOSIS — Z8501 Personal history of malignant neoplasm of esophagus: Secondary | ICD-10-CM | POA: Insufficient documentation

## 2014-04-09 DIAGNOSIS — R1319 Other dysphagia: Secondary | ICD-10-CM

## 2014-04-09 DIAGNOSIS — K222 Esophageal obstruction: Secondary | ICD-10-CM

## 2014-04-09 DIAGNOSIS — I2510 Atherosclerotic heart disease of native coronary artery without angina pectoris: Secondary | ICD-10-CM | POA: Insufficient documentation

## 2014-04-09 DIAGNOSIS — Z9221 Personal history of antineoplastic chemotherapy: Secondary | ICD-10-CM | POA: Insufficient documentation

## 2014-04-09 DIAGNOSIS — Z923 Personal history of irradiation: Secondary | ICD-10-CM | POA: Insufficient documentation

## 2014-04-09 DIAGNOSIS — Z951 Presence of aortocoronary bypass graft: Secondary | ICD-10-CM | POA: Insufficient documentation

## 2014-04-09 DIAGNOSIS — C155 Malignant neoplasm of lower third of esophagus: Secondary | ICD-10-CM

## 2014-04-09 DIAGNOSIS — Z9861 Coronary angioplasty status: Secondary | ICD-10-CM | POA: Insufficient documentation

## 2014-04-09 DIAGNOSIS — R1310 Dysphagia, unspecified: Secondary | ICD-10-CM

## 2014-04-09 HISTORY — PX: BALLOON DILATION: SHX5330

## 2014-04-09 HISTORY — PX: ESOPHAGOGASTRODUODENOSCOPY: SHX5428

## 2014-04-09 LAB — GLUCOSE, CAPILLARY: GLUCOSE-CAPILLARY: 294 mg/dL — AB (ref 70–99)

## 2014-04-09 SURGERY — EGD (ESOPHAGOGASTRODUODENOSCOPY)
Anesthesia: Moderate Sedation

## 2014-04-09 MED ORDER — FENTANYL CITRATE 0.05 MG/ML IJ SOLN
INTRAMUSCULAR | Status: DC | PRN
  Administered 2014-04-09 (×2): 25 ug via INTRAVENOUS

## 2014-04-09 MED ORDER — SODIUM CHLORIDE 0.9 % IV SOLN
INTRAVENOUS | Status: DC
  Administered 2014-04-09: 500 mL via INTRAVENOUS

## 2014-04-09 MED ORDER — BUTAMBEN-TETRACAINE-BENZOCAINE 2-2-14 % EX AERO
INHALATION_SPRAY | CUTANEOUS | Status: DC | PRN
  Administered 2014-04-09: 2 via TOPICAL

## 2014-04-09 MED ORDER — MIDAZOLAM HCL 10 MG/2ML IJ SOLN
INTRAMUSCULAR | Status: DC | PRN
  Administered 2014-04-09 (×2): 2 mg via INTRAVENOUS

## 2014-04-09 MED ORDER — FENTANYL CITRATE 0.05 MG/ML IJ SOLN
INTRAMUSCULAR | Status: AC
  Filled 2014-04-09: qty 2

## 2014-04-09 MED ORDER — MIDAZOLAM HCL 10 MG/2ML IJ SOLN
INTRAMUSCULAR | Status: AC
  Filled 2014-04-09: qty 2

## 2014-04-09 NOTE — Interval H&P Note (Signed)
History and Physical Interval Note:  04/09/2014 2:47 PM  Billy Bush  has presented today for surgery, with the diagnosis of History of esophageal cancer [V10.03] Dysphagia [787.20]  The various methods of treatment have been discussed with the patient and family. After consideration of risks, benefits and other options for treatment, the patient has consented to  Procedure(s) with comments: ESOPHAGOGASTRODUODENOSCOPY (EGD) (N/A) - Possible Dil BALLOON DILATION (N/A) as a surgical intervention .  The patient's history has been reviewed, patient examined, no change in status, stable for surgery.  I have reviewed the patient's chart and labs.  Questions were answered to the patient's satisfaction.    The recent H&P (dated *04/03/14**) was reviewed, the patient was examined and there is no change in the patients condition since that H&P was completed.   Inda Castle  04/09/2014, 2:48 PM    Inda Castle

## 2014-04-09 NOTE — Op Note (Signed)
[facility] [place], [address]   ENDOSCOPY PROCEDURE REPORT  PATIENT: Billy Bush, Billy Bush  MR#: [id number] BIRTHDATE: [birth year] , 72  yrs. old GENDER: Male ENDOSCOPIST: Inda Castle, MD ASSISTANT:   Mariana Single, RN Corliss Parish, technician REFERRED WP:VXYI Edrick Kins, M.D. PROCEDURE DATE:  04/09/2014 PROCEDURE:   EGD with balloon dilatation ASA CLASS:   Class III INDICATIONS:dysphagia. history of esophageal CA, status post chemotherapy and x-ray therapy MEDICATIONS: These medications were titrated to patient response per physician's verbal order, Versed 4 mg IV, and Fentanyl 50 mcg IV  TOPICAL ANESTHETIC:   Cetacaine Spray  DESCRIPTION OF PROCEDURE:   After the risks benefits and alternatives of the procedure were thoroughly explained, informed consent was obtained.  The     endoscope was introduced through the mouth  and advanced to the third portion of the duodenum ,      The instrument was slowly withdrawn as the mucosa was carefully examined.    Z line is at 35 cm.  There is concentric narrowing in the distal esophagus with prominent, infiltrated because a which is friable. The 9 mm scope easily traversed the strictured area. Radiation-induced mucosal changes were seen in the gastric cardia. The remainder of the upper endoscopic exam including distal stomach and duodenum were normal.   Z line is at 35 cm.  There is concentric narrowing in the distal esophagus with prominent, infiltrated because a which is friable.  The 9 mm scope easily traversed the strictured area.  Radiation-induced mucosal changes were seen in the gastric cardia.  The remainder of the upper endoscopic exam including distal stomach and duodenum were normal. Dilation was then performed at the distal esophagus  Dilator:Balloon Size:13.04-11-15.74mm  Reststance:moderate Heme:yes Bleeding was due to mucosal friability  COMPLICATIONS: There were no  complications. ENDOSCOPIC IMPRESSION: 1.  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  residual tumor with mucosal friability  RECOMMENDATIONS: repeat dilatation as needed  eSigned:  Inda Castle, MD 04/09/2014 3:16 PM  CC:

## 2014-04-09 NOTE — Discharge Instructions (Signed)
Call office in one week to report progressEsophageal Stricture The esophagus is the long, narrow tube which carries food and liquid from the mouth to the stomach. Sometimes a part of the esophagus becomes narrow and makes it difficult, painful, or even impossible to swallow. This is called an esophageal stricture.  CAUSES  Common causes of blockage or strictures of the esophagus are:  Exposure of the lower esophagus to the acid from the stomach may cause narrowing.  Hiatal hernia in which a small part of the stomach bulges up through the diaphragm can cause a narrowing in the bottom of the esophagus.  Scleroderma is a tissue disorder that affects the esophagus and makes swallowing difficult.  Achalasia is an absence of nerves in the lower esophagus and to the esophageal sphincter. This absence of nerves may be congenital (present since birth). This can cause irregular spasms which do not allow food and fluid through.  Strictures may develop from swallowing materials which damage the esophagus. Examples are acids or alkalis such as lye.  Schatzki's Ring is a narrow ring of non-cancerous tissue which narrows the lower esophagus. The cause of this is unknown.  Growths can block the esophagus. SYMPTOMS  Some of the problems are difficulty swallowing or pain with swallowing. DIAGNOSIS  Your caregiver often suspects this problem by taking a medical history. They will also do a physical exam. They may then take X-rays and/or perform an endoscopy. Endoscopy is an exam in which a tube like a small flexible telescope is used to look at your esophagus.  TREATMENT  One form of treatment is to dilate the narrow area. This means to stretch it.  When this is not successful, chest surgery may be required. This is a much more extensive form of treatment with a longer recovery time. Both of the above treatments make the passage of food and water into the stomach easier. They also make it easier for stomach  contents to bubble back into the esophagus. Special medications may be used following the procedure to help prevent further narrowing. Medications may be used to lower the amount of acid in the stomach juice.  SEEK IMMEDIATE MEDICAL CARE IF:   Your swallowing is becoming more painful, difficult, or you are unable to swallow.  You vomit up blood.  You develop black tarry stools.  You develop chills.  You have a fever.  You develop chest or abdominal pain.  You develop shortness of breath, feel lightheaded, or faint. Follow up with medical care as your caregiver suggests. Document Released: 07/24/2006 Document Revised: 02/05/2012 Document Reviewed: 08/30/2006 [facility] Patient Information 2014 [place], [place].

## 2014-04-09 NOTE — H&P (View-Only) (Signed)
     History of Present Illness:  Patient is a 72 old white male known to Dr. Kaplan. He has an extensive cardiac history, s/p defrillator placement, CABG with stent placement. He is on multiple medications, including Plavix. Patient was seen in July of last year with dysphasia. EGD revealed esophageal cancer. Patient is followed by Dr. Sherrill for high-grade neuroendocrine carcinoma of the esophagus. Staging PET scan 07/23/2013 showed a large distal esophageal mass , metastatic upper abdominal lymphadenopathy, indeterminate small pulmonary nodules.Patient underwent chemoradiation. Last chemotherapy was in February of this year. Wife tells me he is in remission.  Patient comes in today for evaluation of dysphagia. Patient has had occasional problems swallowing solids over the last several months but in the last 2 weeks it has really progressed. Meats and breads have been very problematic but now even soft foods are problematic. Weight is down 9 pounds in 2 weeks. No odynophagia. Patient has had 2 recent bouts of laryngitis, he is hoarse today.   Current Medications, Allergies, Past Medical History, Past Surgical History, Family History and Social History were reviewed in [facility] electronic medical record.  Physical Exam: General: Pleasant, well developed , white male in no acute distress Head: Normocephalic and atraumatic Eyes:  sclerae anicteric, conjunctiva pink  Ears: Normal auditory acuity Lungs: Clear throughout to auscultation Heart: Regular rate and rhythm Abdomen: Soft, non distended, non-tender. Patient has back pain, difficult to lay on exam table so abdominal exam was limited. Normal bowel sounds Musculoskeletal: Symmetrical with no gross deformities  Extremities: Trace BLE edema  Neurological: Alert oriented x 4, grossly nonfocal Psychological:  Alert and cooperative. Normal mood and affect  Assessment and Recommendations:  1. Dysphagia. Patient is s/p chemoxrt for  esophageal cancer (last chemo was February 2015). Restaging CTscan in March showed that esophageal tumor had responded but there was new upper thoracic adenopathy consistent with progressive disease. I suspect patient may have a radiation induced stricture. For further evaluation and treatment patient will be scheduled for upper endoscopy with probable dilation  2. Extensive cardiac history, s/p defrillator placement, CABG with stent placement. On chronic Plavix. We will contact prescribing provider to make sure Plavix can be held for EGD.  .          

## 2014-04-10 ENCOUNTER — Encounter (HOSPITAL_COMMUNITY): Payer: Self-pay | Admitting: Gastroenterology

## 2014-04-10 ENCOUNTER — Telehealth: Payer: Self-pay | Admitting: *Deleted

## 2014-04-10 NOTE — Telephone Encounter (Signed)
Note per Rogene Houston:  Billy Bush, you were going to make a referral to the pain clinic, still have not heard from anyone concerning this matter. Can some please let them know.  Please advise. Thank you.

## 2014-04-10 NOTE — Telephone Encounter (Signed)
Pt advised that he will be seeing a orthopedic Dr and should here Monday about an appointment.

## 2014-04-13 ENCOUNTER — Telehealth: Payer: Self-pay | Admitting: *Deleted

## 2014-04-13 NOTE — Telephone Encounter (Signed)
Pt's wife called with update; "want Dr. Benay Spice to know he had upper endoscopy with dilatation on 5/14 and Dr. Deatra Ina will send report"  Wife states that his throat is "raw, gagging from mucous" but knows "it's to be expected"  Wife also wanted Dr. Benay Spice to know Dr. Loanne Drilling is making referral to orthopedic MD for his back pain.  Note to Dr. Benay Spice.

## 2014-04-14 ENCOUNTER — Encounter: Payer: Self-pay | Admitting: Internal Medicine

## 2014-04-14 MED ORDER — CYCLOBENZAPRINE HCL 10 MG PO TABS: 10.0000 mg | ORAL_TABLET | Freq: Three times a day (TID) | ORAL | Status: DC | PRN

## 2014-04-14 MED ORDER — OXYCODONE-ACETAMINOPHEN 10-325 MG PO TABS
1.0000 | ORAL_TABLET | ORAL | Status: DC | PRN
Start: 2014-04-14 — End: 2014-05-01

## 2014-04-14 NOTE — Telephone Encounter (Signed)
Ok, i have sent a prescription to your pharmacy.  Please use caution, as this combination could cause drowsiness.

## 2014-04-14 NOTE — Telephone Encounter (Signed)
Ok, i printed 

## 2014-04-14 NOTE — Telephone Encounter (Signed)
See below and please advise, Thanks!  

## 2014-04-14 NOTE — Telephone Encounter (Signed)
Pt's wife informed. She states that the pt Rx of the Hydrocodone is not helping with the pt's pain. Pt has told wife that the Percocet helps relieve pain. Please advise if refill can be given. Thanks!

## 2014-04-14 NOTE — Telephone Encounter (Signed)
Saw dr Dixie Dials this am and has now referred pt to spine specialist Dr Ron Agee appt is 04/30/14. Dr. Dixie Dials suspects it is muscular or spine related.   hydrocodone is not managing pain at this time he is in tremendous pain all the time. Please advise on what we can do to control his pain he is not eating because of the pain. Maybe a muscle relaxer?  Percocet helps a little bit more but not that much

## 2014-04-15 ENCOUNTER — Telehealth: Payer: Self-pay | Admitting: Gastroenterology

## 2014-04-15 NOTE — Telephone Encounter (Signed)
Spoke with pts wife and she is aware. 

## 2014-04-15 NOTE — Telephone Encounter (Signed)
Pts wife states the pt had an egd with dil 04/09/14 and he is swallowing better. States pt has been having what she would call "dry heaves" since the procedure along with spitting up lots of mucous. State the mucous is not very thick. Pt is taking pain medication for his back along with flexaril. They have some compazine on hand from when he had chemo. Wife wants to know if this is to be expected, how long it may last, and what they can do. Please advise.

## 2014-04-15 NOTE — Telephone Encounter (Signed)
I doubt that the dry heaves are 2 to the procedure.  He should take Compazine as needed.  If it does not subside he should see his PCP

## 2014-04-15 NOTE — Telephone Encounter (Signed)
Pt's wife advised that rx is ready for pick up. Script placed upfront.

## 2014-04-17 ENCOUNTER — Ambulatory Visit (INDEPENDENT_AMBULATORY_CARE_PROVIDER_SITE_OTHER): Payer: Medicare HMO | Admitting: Cardiology

## 2014-04-17 ENCOUNTER — Encounter: Payer: Self-pay | Admitting: Cardiology

## 2014-04-17 VITALS — BP 90/50 | HR 73 | Ht 67.0 in | Wt 151.0 lb

## 2014-04-17 DIAGNOSIS — I5022 Chronic systolic (congestive) heart failure: Secondary | ICD-10-CM

## 2014-04-17 DIAGNOSIS — E785 Hyperlipidemia, unspecified: Secondary | ICD-10-CM

## 2014-04-17 DIAGNOSIS — I25810 Atherosclerosis of coronary artery bypass graft(s) without angina pectoris: Secondary | ICD-10-CM

## 2014-04-17 LAB — BASIC METABOLIC PANEL
BUN: 28 mg/dL — ABNORMAL HIGH (ref 6–23)
CALCIUM: 9.1 mg/dL (ref 8.4–10.5)
CO2: 30 mEq/L (ref 19–32)
CREATININE: 1.5 mg/dL (ref 0.4–1.5)
Chloride: 95 mEq/L — ABNORMAL LOW (ref 96–112)
GFR: 47.98 mL/min — AB (ref >60.00)
Glucose, Bld: 200 mg/dL — ABNORMAL HIGH (ref 70–99)
Potassium: 4.2 mEq/L (ref 3.5–5.1)
Sodium: 133 mEq/L — ABNORMAL LOW (ref 135–145)

## 2014-04-17 LAB — BRAIN NATRIURETIC PEPTIDE: Pro B Natriuretic peptide (BNP): 456 pg/mL — ABNORMAL HIGH (ref 0.0–100.0)

## 2014-04-17 NOTE — Patient Instructions (Signed)
Your physician recommends that you have lab work today--BMET/BNP.  Your physician recommends that you schedule a follow-up appointment in: 3 months with Dr McLean.     

## 2014-04-18 NOTE — Progress Notes (Signed)
Patient ID: Billy Bush, male   DOB: July 24, 1941, 73 y.o.   MRN: [id number] PCP: Dr. Loanne Drilling  73 yo with history of CAD s/p CABG, ischemic cardiomyopathy, and esophageal cancer presents for cardiology followup.  He was diagnosed in 2014 with a high grade neuroendocrine esophageal cancer (had dysphagia) and has undergone chemotherapy and radiation. Last echo was in 2/15 and showed EF 25-30% with moderate MR and TR.  His last cardiac cath was in 2008.  At that time, his bypass grafts were patent but he had Cypher DES to left main.  Patient developed recurrent dysphagia recently from radiation therapy-related esophageal stricture. He is now s/p dilatation and is able to swallow again without problems.  Unfortunately, he lost a lot of weight due to poor po intake and is now down 18 lbs since last appointment.   No chest pain.  No exertional dyspnea.  He can climb a flight of steps but is tired at the top.  He can take the garbage cans out to the street.  No orthopnea or PND.  No syncope.  He is lightheaded only if he stands up too fast.  BP is low at 90/50 today.  His main problem now is low back pain.   ECG; NSR, IVCD 122 msec, lateral T wave inversions   Labs (2/14): LDL 81, HDL 43 Labs (9/14): K 3.7, creatinine 0.9 Labs (10/14): hemoglobin 8.3, plts 173 Labs (1/15): HCT 21.2, platelets 47, K 4.0, creatinine 0.79, LDL 62, HDL 50 Labs (2/15): BNP 390, K 4.1, creatinine 0.9, hemoglobin 6.8 => 9.1, plts 75 Labs (3/15): K 4.5, creatinine 1.0 Labs (4/15): LDL 63, HDL 50, HCT 27.2, plts 114  PMH: 1. Type II diabetes 2. CAD: s/p CABG 1997.  Last LHC in 2008 showed 80% distal LM, patent LIMA-LAD, patent SVG-OM, patent SVG-PDA.  Patient had Cypher DES to left main.  3. Ischemic cardiomyopathy: Patient has a Research officer, political party ICD.  Echo in 11/12 showed EF 30-35% with severe LV dilation, EF 30-35%, apical and basal inferior akinesis, moderate MR, mild AI. Echo (2/15) with severe LV dilation, septal and inferior  hypokinesis, EF 25-30%, moderate TR, moderate MR, PA systolic pressure 68 mmHg.  4. 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  5. Hyperlipidemia 6. 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  FH: No premature CAD.   ROS: All systems reviewed and negative except as per HPI.   Current Outpatient Prescriptions  Medication Sig Dispense Refill  . acetaminophen (TYLENOL) 325 MG tablet Take 650 mg by mouth every 6 (six) hours as needed.      Marland Kitchen aspirin EC 81 MG tablet Take 1 tablet (81 mg total) by mouth daily.      Marland Kitchen atorvastatin (LIPITOR) 80 MG tablet TAKE 1 TABLET AT BEDTIME  90 tablet  0  . carvedilol (COREG) 12.5 MG tablet Take 1 tablet (12.5 mg total) by mouth 2 (two) times daily with a meal.  180 tablet  1  . citalopram (CELEXA) 10 MG tablet Take 1 tablet (10 mg total) by mouth daily.  90 tablet  prn  . clopidogrel (PLAVIX) 75 MG tablet Take 1 tablet (75 mg total) by mouth daily.      Marland Kitchen co-enzyme Q-10 30 MG capsule Take 30 mg by mouth daily.        . cyclobenzaprine (FLEXERIL) 10 MG  tablet Take 1 tablet (10 mg total) by mouth 3 (three) times daily as needed for muscle spasms.  30 tablet  0  . furosemide (LASIX) 80 MG tablet 1 tablet (80 mg)in the AM and 1/2 tablet(40mg ) in the PM  135 tablet  3  . glucose blood (ONE TOUCH ULTRA TEST) test strip Use as directed twice daily dx 250.01  200 each  0  . HYDROcodone-acetaminophen (NORCO) 10-325 MG per tablet Take 1 tablet by mouth every 4 (four) hours as needed.  120 tablet  0  . insulin lispro (HUMALOG) 100 UNIT/ML injection Inject into the skin 3x a day (just before each meal) 04-30-13 units      . Insulin Pen Needle (BD PEN NEEDLE NANO U/F) 32G X 4 MM MISC Use as directed four times daily  400 each  3  . losartan  (COZAAR) 50 MG tablet 1/2 tablet (total 25mg ) daily  45 tablet  3  . Multiple Vitamin (MULTIVITAMIN) tablet Take 1 tablet by mouth daily.        . nitroGLYCERIN (NITROSTAT) 0.4 MG SL tablet Place 1 tablet (0.4 mg total) under the tongue every 5 (five) minutes as needed for chest pain.  25 tablet  3  . oxyCODONE-acetaminophen (PERCOCET) 10-325 MG per tablet Take 1 tablet by mouth every 4 (four) hours as needed for pain.  100 tablet  0  . prochlorperazine (COMPAZINE) 10 MG tablet Take 10 mg by mouth every 6 (six) hours as needed for nausea or vomiting.      Marland Kitchen spironolactone (ALDACTONE) 25 MG tablet 1 tablet daily  90 tablet  1  . traMADol (ULTRAM) 50 MG tablet Take 50 mg by mouth as needed.       No current facility-administered medications for this visit.    BP 90/50  Pulse 73  Ht 5\' 7"  (1.702 m)  Wt 151 lb (68.493 kg)  BMI 23.64 kg/m2 General: NAD Neck: JVP 7 cm, no thyromegaly or thyroid nodule.  Lungs: Clear to auscultation bilaterally with normal respiratory effort. CV: Nondisplaced PMI.  Heart regular S1/S2, no S3/S4, 2/6 HSM at apex.  No edema.  No carotid bruit.  Normal pedal pulses.  Abdomen: Soft, nontender, no hepatosplenomegaly, no distention.  Neurologic: Alert and oriented x 3.  Psych: Normal affect. Extremities: No clubbing or cyanosis.   Assessment/Plan: 1. CAD: s/p CABG and later Cypher DES to left main in 2008.  He has been on ASA and Plavix chronically.  I have planned to continue Plavix long-term given 1st generation DES and no bleeding problems. He stopped Plavix briefly with chemo-related thrombocytopenia.  It has now been restarted.  Continue ASA 81, statin, Coreg, ramipril. 2. Chronic systolic CHF: Patient looks euvolemic today.  He has NYHA class II symptoms.  EF 25-30% on last echo (basically stable). QRS is not prolonged to the point where I would expect him to benefit from CRT.  He has a Research officer, political party ICD.  He will continue current doses of Coreg, losartan, and  spironolactone (no changes given soft blood pressure). Continue current Lasix dose 80 qam, 40 qpm.  However, given poor po intake and fall in weight, I will check BMET/BNP today.  If creatinine is up, will need to cut back on Lasix.  3. Hyperlipidemia: Good lipids in 1/15.   4. Murmur: Moderate MR and TR by echo.   Larey Dresser 04/18/2014

## 2014-04-21 ENCOUNTER — Telehealth: Payer: Self-pay | Admitting: Gastroenterology

## 2014-04-21 ENCOUNTER — Other Ambulatory Visit: Payer: Self-pay | Admitting: *Deleted

## 2014-04-21 ENCOUNTER — Telehealth: Payer: Self-pay | Admitting: *Deleted

## 2014-04-21 ENCOUNTER — Encounter: Payer: Self-pay | Admitting: *Deleted

## 2014-04-21 DIAGNOSIS — I5022 Chronic systolic (congestive) heart failure: Secondary | ICD-10-CM

## 2014-04-21 MED ORDER — CLOPIDOGREL BISULFATE 75 MG PO TABS: 75.0000 mg | ORAL_TABLET | Freq: Every day | ORAL | Status: AC

## 2014-04-21 NOTE — Telephone Encounter (Signed)
Left message for pt to call back.  Pts wife states they called for a refill for his zofran and were told by the pharmacy that they needed a prior-auth. Pharmacy to contact the office regarding prior auth. Wife wanted to let us know to be on the lookout for the prior auth.

## 2014-04-21 NOTE — Telephone Encounter (Signed)
I called [employer] at [phone] and did a prior authorization for the Ondansetron HCL 4 mg.  He is to take 1 before meals.  They asked if the patient had Chemo administered in the home or on site at a facility.  I spoke to Mrs. Sanderfer and he had all his cancer treatments at [facility].  ( Esophageal cancer and Nausea & vomiting).  Holland Falling did approve this until 11-26-2014.  The case number for authorization is [id number].  I informed Mrs. Matthew Cina this was approved and I told her I will call [place].  I spoke to Judson Roch the pharmacist to advise this was approved.

## 2014-04-24 ENCOUNTER — Emergency Department (HOSPITAL_COMMUNITY): Payer: Medicare HMO

## 2014-04-24 ENCOUNTER — Encounter (HOSPITAL_COMMUNITY): Payer: Self-pay | Admitting: Emergency Medicine

## 2014-04-24 ENCOUNTER — Inpatient Hospital Stay (HOSPITAL_COMMUNITY)
Admission: EM | Admit: 2014-04-24 | Discharge: 2014-05-01 | DRG: 374 | Disposition: A | Discharge status: Hospice | Payer: Medicare HMO | Attending: Internal Medicine | Admitting: Internal Medicine

## 2014-04-24 ENCOUNTER — Telehealth: Payer: Self-pay | Admitting: *Deleted

## 2014-04-24 DIAGNOSIS — F411 Generalized anxiety disorder: Secondary | ICD-10-CM | POA: Diagnosis present

## 2014-04-24 DIAGNOSIS — M545 Low back pain, unspecified: Secondary | ICD-10-CM

## 2014-04-24 DIAGNOSIS — E1029 Type 1 diabetes mellitus with other diabetic kidney complication: Secondary | ICD-10-CM | POA: Diagnosis present

## 2014-04-24 DIAGNOSIS — R1310 Dysphagia, unspecified: Secondary | ICD-10-CM | POA: Diagnosis present

## 2014-04-24 DIAGNOSIS — I4891 Unspecified atrial fibrillation: Secondary | ICD-10-CM | POA: Diagnosis present

## 2014-04-24 DIAGNOSIS — Z515 Encounter for palliative care: Secondary | ICD-10-CM

## 2014-04-24 DIAGNOSIS — I10 Essential (primary) hypertension: Secondary | ICD-10-CM | POA: Diagnosis present

## 2014-04-24 DIAGNOSIS — E109 Type 1 diabetes mellitus without complications: Secondary | ICD-10-CM | POA: Diagnosis present

## 2014-04-24 DIAGNOSIS — R7989 Other specified abnormal findings of blood chemistry: Secondary | ICD-10-CM | POA: Diagnosis present

## 2014-04-24 DIAGNOSIS — C771 Secondary and unspecified malignant neoplasm of intrathoracic lymph nodes: Secondary | ICD-10-CM | POA: Diagnosis present

## 2014-04-24 DIAGNOSIS — R972 Elevated prostate specific antigen [PSA]: Secondary | ICD-10-CM

## 2014-04-24 DIAGNOSIS — M25519 Pain in unspecified shoulder: Secondary | ICD-10-CM

## 2014-04-24 DIAGNOSIS — E43 Unspecified severe protein-calorie malnutrition: Secondary | ICD-10-CM | POA: Diagnosis present

## 2014-04-24 DIAGNOSIS — I447 Left bundle-branch block, unspecified: Secondary | ICD-10-CM

## 2014-04-24 DIAGNOSIS — Z923 Personal history of irradiation: Secondary | ICD-10-CM

## 2014-04-24 DIAGNOSIS — G609 Hereditary and idiopathic neuropathy, unspecified: Secondary | ICD-10-CM

## 2014-04-24 DIAGNOSIS — N529 Male erectile dysfunction, unspecified: Secondary | ICD-10-CM

## 2014-04-24 DIAGNOSIS — D6481 Anemia due to antineoplastic chemotherapy: Secondary | ICD-10-CM | POA: Diagnosis present

## 2014-04-24 DIAGNOSIS — E871 Hypo-osmolality and hyponatremia: Secondary | ICD-10-CM

## 2014-04-24 DIAGNOSIS — R627 Adult failure to thrive: Secondary | ICD-10-CM | POA: Diagnosis present

## 2014-04-24 DIAGNOSIS — D509 Iron deficiency anemia, unspecified: Secondary | ICD-10-CM

## 2014-04-24 DIAGNOSIS — Z9221 Personal history of antineoplastic chemotherapy: Secondary | ICD-10-CM

## 2014-04-24 DIAGNOSIS — Z860100 Personal history of colon polyps, unspecified: Secondary | ICD-10-CM

## 2014-04-24 DIAGNOSIS — E785 Hyperlipidemia, unspecified: Secondary | ICD-10-CM | POA: Diagnosis present

## 2014-04-24 DIAGNOSIS — E11329 Type 2 diabetes mellitus with mild nonproliferative diabetic retinopathy without macular edema: Secondary | ICD-10-CM

## 2014-04-24 DIAGNOSIS — I059 Rheumatic mitral valve disease, unspecified: Secondary | ICD-10-CM | POA: Diagnosis present

## 2014-04-24 DIAGNOSIS — I509 Heart failure, unspecified: Secondary | ICD-10-CM | POA: Diagnosis present

## 2014-04-24 DIAGNOSIS — Z0000 Encounter for general adult medical examination without abnormal findings: Secondary | ICD-10-CM

## 2014-04-24 DIAGNOSIS — D126 Benign neoplasm of colon, unspecified: Secondary | ICD-10-CM

## 2014-04-24 DIAGNOSIS — E1065 Type 1 diabetes mellitus with hyperglycemia: Secondary | ICD-10-CM

## 2014-04-24 DIAGNOSIS — E861 Hypovolemia: Secondary | ICD-10-CM | POA: Diagnosis present

## 2014-04-24 DIAGNOSIS — E1039 Type 1 diabetes mellitus with other diabetic ophthalmic complication: Secondary | ICD-10-CM | POA: Diagnosis present

## 2014-04-24 DIAGNOSIS — F329 Major depressive disorder, single episode, unspecified: Secondary | ICD-10-CM

## 2014-04-24 DIAGNOSIS — IMO0002 Reserved for concepts with insufficient information to code with codable children: Secondary | ICD-10-CM

## 2014-04-24 DIAGNOSIS — Z794 Long term (current) use of insulin: Secondary | ICD-10-CM

## 2014-04-24 DIAGNOSIS — I951 Orthostatic hypotension: Secondary | ICD-10-CM | POA: Diagnosis present

## 2014-04-24 DIAGNOSIS — D6959 Other secondary thrombocytopenia: Secondary | ICD-10-CM | POA: Diagnosis present

## 2014-04-24 DIAGNOSIS — Z7902 Long term (current) use of antithrombotics/antiplatelets: Secondary | ICD-10-CM

## 2014-04-24 DIAGNOSIS — C7951 Secondary malignant neoplasm of bone: Secondary | ICD-10-CM | POA: Diagnosis present

## 2014-04-24 DIAGNOSIS — C787 Secondary malignant neoplasm of liver and intrahepatic bile duct: Secondary | ICD-10-CM | POA: Diagnosis present

## 2014-04-24 DIAGNOSIS — C159 Malignant neoplasm of esophagus, unspecified: Secondary | ICD-10-CM

## 2014-04-24 DIAGNOSIS — Z951 Presence of aortocoronary bypass graft: Secondary | ICD-10-CM

## 2014-04-24 DIAGNOSIS — T451X5A Adverse effect of antineoplastic and immunosuppressive drugs, initial encounter: Secondary | ICD-10-CM | POA: Diagnosis present

## 2014-04-24 DIAGNOSIS — I2510 Atherosclerotic heart disease of native coronary artery without angina pectoris: Secondary | ICD-10-CM | POA: Diagnosis present

## 2014-04-24 DIAGNOSIS — C7952 Secondary malignant neoplasm of bone marrow: Secondary | ICD-10-CM

## 2014-04-24 DIAGNOSIS — N179 Acute kidney failure, unspecified: Secondary | ICD-10-CM | POA: Diagnosis present

## 2014-04-24 DIAGNOSIS — C155 Malignant neoplasm of lower third of esophagus: Secondary | ICD-10-CM

## 2014-04-24 DIAGNOSIS — Z125 Encounter for screening for malignant neoplasm of prostate: Secondary | ICD-10-CM

## 2014-04-24 DIAGNOSIS — D630 Anemia in neoplastic disease: Secondary | ICD-10-CM | POA: Diagnosis present

## 2014-04-24 DIAGNOSIS — I959 Hypotension, unspecified: Secondary | ICD-10-CM

## 2014-04-24 DIAGNOSIS — Z79899 Other long term (current) drug therapy: Secondary | ICD-10-CM

## 2014-04-24 DIAGNOSIS — Z95810 Presence of automatic (implantable) cardiac defibrillator: Secondary | ICD-10-CM

## 2014-04-24 DIAGNOSIS — F3289 Other specified depressive episodes: Secondary | ICD-10-CM | POA: Diagnosis present

## 2014-04-24 DIAGNOSIS — S0990XA Unspecified injury of head, initial encounter: Secondary | ICD-10-CM

## 2014-04-24 DIAGNOSIS — J3800 Paralysis of vocal cords and larynx, unspecified: Secondary | ICD-10-CM | POA: Diagnosis present

## 2014-04-24 DIAGNOSIS — Z87891 Personal history of nicotine dependence: Secondary | ICD-10-CM

## 2014-04-24 DIAGNOSIS — N189 Chronic kidney disease, unspecified: Secondary | ICD-10-CM | POA: Diagnosis present

## 2014-04-24 DIAGNOSIS — R490 Dysphonia: Secondary | ICD-10-CM | POA: Diagnosis present

## 2014-04-24 DIAGNOSIS — I2589 Other forms of chronic ischemic heart disease: Secondary | ICD-10-CM

## 2014-04-24 DIAGNOSIS — I129 Hypertensive chronic kidney disease with stage 1 through stage 4 chronic kidney disease, or unspecified chronic kidney disease: Secondary | ICD-10-CM | POA: Diagnosis present

## 2014-04-24 DIAGNOSIS — C782 Secondary malignant neoplasm of pleura: Secondary | ICD-10-CM | POA: Diagnosis present

## 2014-04-24 DIAGNOSIS — E860 Dehydration: Secondary | ICD-10-CM

## 2014-04-24 DIAGNOSIS — K222 Esophageal obstruction: Secondary | ICD-10-CM

## 2014-04-24 DIAGNOSIS — R1319 Other dysphagia: Secondary | ICD-10-CM

## 2014-04-24 DIAGNOSIS — I080 Rheumatic disorders of both mitral and aortic valves: Secondary | ICD-10-CM

## 2014-04-24 DIAGNOSIS — I25810 Atherosclerosis of coronary artery bypass graft(s) without angina pectoris: Secondary | ICD-10-CM

## 2014-04-24 DIAGNOSIS — D696 Thrombocytopenia, unspecified: Secondary | ICD-10-CM

## 2014-04-24 DIAGNOSIS — Z7982 Long term (current) use of aspirin: Secondary | ICD-10-CM

## 2014-04-24 DIAGNOSIS — I5022 Chronic systolic (congestive) heart failure: Secondary | ICD-10-CM | POA: Diagnosis present

## 2014-04-24 DIAGNOSIS — N058 Unspecified nephritic syndrome with other morphologic changes: Secondary | ICD-10-CM | POA: Diagnosis present

## 2014-04-24 HISTORY — DX: Esophageal obstruction: K22.2

## 2014-04-24 LAB — CBC
HCT: 26.7 % — ABNORMAL LOW (ref 39.0–52.0)
Hemoglobin: 9.1 g/dL — ABNORMAL LOW (ref 13.0–17.0)
MCH: 29.9 pg (ref 26.0–34.0)
MCHC: 34.1 g/dL (ref 30.0–36.0)
MCV: 87.8 fL (ref 78.0–100.0)
Platelets: 196 10*3/uL (ref 150–400)
RBC: 3.04 MIL/uL — ABNORMAL LOW (ref 4.22–5.81)
RDW: 13.7 % (ref 11.5–15.5)
WBC: 11.5 10*3/uL — ABNORMAL HIGH (ref 4.0–10.5)

## 2014-04-24 LAB — COMPREHENSIVE METABOLIC PANEL WITH GFR
ALT: 18 U/L (ref 0–53)
AST: 60 U/L — ABNORMAL HIGH (ref 0–37)
Albumin: 3.1 g/dL — ABNORMAL LOW (ref 3.5–5.2)
Alkaline Phosphatase: 209 U/L — ABNORMAL HIGH (ref 39–117)
BUN: 42 mg/dL — ABNORMAL HIGH (ref 6–23)
CO2: 23 meq/L (ref 19–32)
Calcium: 9.4 mg/dL (ref 8.4–10.5)
Chloride: 85 meq/L — ABNORMAL LOW (ref 96–112)
Creatinine, Ser: 2.43 mg/dL — ABNORMAL HIGH (ref 0.50–1.35)
GFR calc Af Amer: 29 mL/min — ABNORMAL LOW
GFR calc non Af Amer: 25 mL/min — ABNORMAL LOW
Glucose, Bld: 167 mg/dL — ABNORMAL HIGH (ref 70–99)
Potassium: 4.7 meq/L (ref 3.7–5.3)
Sodium: 124 meq/L — ABNORMAL LOW (ref 137–147)
Total Bilirubin: 0.9 mg/dL (ref 0.3–1.2)
Total Protein: 6.8 g/dL (ref 6.0–8.3)

## 2014-04-24 LAB — I-STAT CG4 LACTIC ACID, ED: Lactic Acid, Venous: 1.39 mmol/L (ref 0.5–2.2)

## 2014-04-24 MED ORDER — ASPIRIN EC 81 MG PO TBEC: 81.0000 mg | DELAYED_RELEASE_TABLET | Freq: Every day | ORAL | Status: DC

## 2014-04-24 MED ORDER — OXYCODONE HCL 5 MG PO TABS
5.0000 mg | ORAL_TABLET | ORAL | Status: DC | PRN
  Administered 2014-04-25 – 2014-04-27 (×11): 5 mg via ORAL
  Filled 2014-04-24 (×13): qty 1

## 2014-04-24 MED ORDER — DOCUSATE SODIUM 100 MG PO CAPS
100.0000 mg | ORAL_CAPSULE | Freq: Two times a day (BID) | ORAL | Status: DC
  Administered 2014-04-25 – 2014-05-01 (×12): 100 mg via ORAL
  Filled 2014-04-24 (×14): qty 1

## 2014-04-24 MED ORDER — COENZYME Q10 30 MG PO CAPS: 30.0000 mg | ORAL_CAPSULE | Freq: Every day | ORAL | Status: DC

## 2014-04-24 MED ORDER — ATORVASTATIN CALCIUM 80 MG PO TABS
80.0000 mg | ORAL_TABLET | Freq: Every day | ORAL | Status: DC
  Administered 2014-04-25 – 2014-04-30 (×7): 80 mg via ORAL
  Filled 2014-04-24 (×8): qty 1

## 2014-04-24 MED ORDER — CLOPIDOGREL BISULFATE 75 MG PO TABS
75.0000 mg | ORAL_TABLET | Freq: Every day | ORAL | Status: DC
  Administered 2014-04-25 – 2014-04-26 (×2): 75 mg via ORAL
  Filled 2014-04-24 (×3): qty 1

## 2014-04-24 MED ORDER — CARVEDILOL 12.5 MG PO TABS
12.5000 mg | ORAL_TABLET | Freq: Two times a day (BID) | ORAL | Status: DC
  Filled 2014-04-24 (×3): qty 1

## 2014-04-24 MED ORDER — NITROGLYCERIN 0.4 MG SL SUBL: 0.4000 mg | SUBLINGUAL_TABLET | SUBLINGUAL | Status: DC | PRN

## 2014-04-24 MED ORDER — MAGNESIUM CITRATE PO SOLN: 1.0000 | Freq: Once | ORAL | Status: AC | PRN

## 2014-04-24 MED ORDER — HYDROCODONE-ACETAMINOPHEN 5-325 MG PO TABS
2.0000 | ORAL_TABLET | Freq: Once | ORAL | Status: AC
  Administered 2014-04-24: 2 via ORAL
  Filled 2014-04-24: qty 2

## 2014-04-24 MED ORDER — DEXTROSE 5 % IV SOLN
1.0000 g | INTRAVENOUS | Status: DC
  Administered 2014-04-25: 1 g via INTRAVENOUS
  Filled 2014-04-24 (×3): qty 10

## 2014-04-24 MED ORDER — VITAMIN B-1 100 MG PO TABS
100.0000 mg | ORAL_TABLET | Freq: Every day | ORAL | Status: DC
  Administered 2014-04-25 – 2014-05-01 (×7): 100 mg via ORAL
  Filled 2014-04-24 (×7): qty 1

## 2014-04-24 MED ORDER — SALINE SPRAY 0.65 % NA SOLN
1.0000 | NASAL | Status: DC | PRN
  Filled 2014-04-24: qty 44

## 2014-04-24 MED ORDER — HEPARIN SODIUM (PORCINE) 5000 UNIT/ML IJ SOLN
5000.0000 [IU] | Freq: Three times a day (TID) | INTRAMUSCULAR | Status: DC
  Administered 2014-04-25 – 2014-05-01 (×20): 5000 [IU] via SUBCUTANEOUS
  Filled 2014-04-24 (×24): qty 1

## 2014-04-24 MED ORDER — SODIUM CHLORIDE 0.9 % IJ SOLN
3.0000 mL | Freq: Two times a day (BID) | INTRAMUSCULAR | Status: DC
  Administered 2014-04-27 – 2014-04-30 (×4): 3 mL via INTRAVENOUS

## 2014-04-24 MED ORDER — ALUM & MAG HYDROXIDE-SIMETH 200-200-20 MG/5ML PO SUSP
30.0000 mL | Freq: Four times a day (QID) | ORAL | Status: DC | PRN
Start: 2014-04-24 — End: 2014-05-01

## 2014-04-24 MED ORDER — ACETAMINOPHEN 650 MG RE SUPP: 650.0000 mg | Freq: Four times a day (QID) | RECTAL | Status: DC | PRN

## 2014-04-24 MED ORDER — BISACODYL 10 MG RE SUPP: 10.0000 mg | Freq: Every day | RECTAL | Status: DC | PRN

## 2014-04-24 MED ORDER — ADULT MULTIVITAMIN W/MINERALS CH
1.0000 | ORAL_TABLET | Freq: Every day | ORAL | Status: DC
  Administered 2014-04-25 – 2014-05-01 (×7): 1 via ORAL
  Filled 2014-04-24 (×7): qty 1

## 2014-04-24 MED ORDER — ONDANSETRON HCL 4 MG PO TABS: 4.0000 mg | ORAL_TABLET | Freq: Four times a day (QID) | ORAL | Status: DC | PRN

## 2014-04-24 MED ORDER — SODIUM CHLORIDE 0.9 % IV BOLUS (SEPSIS)
1000.0000 mL | Freq: Once | INTRAVENOUS | Status: AC
  Administered 2014-04-24: 1000 mL via INTRAVENOUS

## 2014-04-24 MED ORDER — CITALOPRAM HYDROBROMIDE 10 MG PO TABS
10.0000 mg | ORAL_TABLET | Freq: Every day | ORAL | Status: DC
  Administered 2014-04-25 – 2014-05-01 (×7): 10 mg via ORAL
  Filled 2014-04-24 (×8): qty 1

## 2014-04-24 MED ORDER — ASPIRIN EC 325 MG PO TBEC
325.0000 mg | DELAYED_RELEASE_TABLET | Freq: Every day | ORAL | Status: DC
  Administered 2014-04-25: 325 mg via ORAL
  Filled 2014-04-24: qty 1

## 2014-04-24 MED ORDER — FOLIC ACID 1 MG PO TABS
1.0000 mg | ORAL_TABLET | Freq: Every day | ORAL | Status: DC
  Administered 2014-04-25 – 2014-05-01 (×7): 1 mg via ORAL
  Filled 2014-04-24 (×7): qty 1

## 2014-04-24 MED ORDER — ONDANSETRON HCL 4 MG/2ML IJ SOLN
4.0000 mg | Freq: Four times a day (QID) | INTRAMUSCULAR | Status: DC | PRN
  Administered 2014-04-26 – 2014-04-30 (×5): 4 mg via INTRAVENOUS
  Filled 2014-04-24 (×6): qty 2

## 2014-04-24 MED ORDER — SODIUM CHLORIDE 0.9 % IV SOLN
INTRAVENOUS | Status: DC
  Administered 2014-04-25 (×2): via INTRAVENOUS

## 2014-04-24 MED ORDER — ACETAMINOPHEN 325 MG PO TABS: 650.0000 mg | ORAL_TABLET | Freq: Four times a day (QID) | ORAL | Status: DC | PRN

## 2014-04-24 NOTE — ED Notes (Signed)
Family member requesting pain medication for patient

## 2014-04-24 NOTE — ED Provider Notes (Signed)
CSN: [id number]     Arrival date & time 04/24/14  1921 History   First MD Initiated Contact with Patient 04/24/14 1948     Chief Complaint  Patient presents with  . Weakness     (Consider location/radiation/quality/duration/timing/severity/associated sxs/prior Treatment) HPI Comments: Decreased PO intake since esophageal dilation 2 weeks ago. Weakness and dizziness worsening. Dizziness with standing up quickly. No syncope, but fell today.  Patient is a 73 y.o. male presenting with weakness. The history is provided by the patient.  Weakness This is a new problem. The current episode started more than 2 days ago. The problem occurs constantly. The problem has been gradually worsening. Pertinent negatives include no chest pain, no abdominal pain and no shortness of breath. Nothing aggravates the symptoms. Nothing relieves the symptoms.    Past Medical History  Diagnosis Date  . Dyslipidemia   . Hypertension   . History of colonic polyps   . Thrombocytopenia   . Nonproliferative diabetic retinopathy NOS(362.03)   . Impotence of organic origin   . Iron deficiency anemia, unspecified   . Unspecified hereditary and idiopathic peripheral neuropathy   . Diabetes mellitus   . Depressive disorder, not elsewhere classified   . Coronary artery disease     a. s/p CABG 1997; b. LHC 04/2007: Distal left main 80% treated with PCI (Cypher DES), LIMA-LAD patent, SVG-OM patent, SVG-PDA patent, EF 30% with inferior HK  . Chronic systolic heart failure     a. Echo 09/2011: EF 30-35%, apical AK, basal inferior AK, mild AI, moderate MR, moderate LAE  . Anxiety state, unspecified   . Ischemic cardiomyopathy   . S/P ICD (internal cardiac defibrillator) procedure   . Esophageal cancer   . Elevated PSA   . History of radiation therapy 08/06/13-09/12/13    esophageal  . Esophageal stricture    Past Surgical History  Procedure Laterality Date  . Coronary artery bypass graft      X 3  . Cardiac  catheterization  05/23/07  . Insert / replace / remove pacemaker  07/05/07    [employer] SINGLE-CHAMBER DEFIBRILLATOR, Cristopher Peru, MD  . Esophagogastroduodenoscopy N/A 07/08/2013    Procedure: ESOPHAGOGASTRODUODENOSCOPY (EGD) with possible Dilation.;  Surgeon: Inda Castle, MD;  Location: [facility];  Service: Endoscopy;  Laterality: N/A;  . Esophagogastroduodenoscopy N/A 04/09/2014    Procedure: ESOPHAGOGASTRODUODENOSCOPY (EGD);  Surgeon: Inda Castle, MD;  Location: [facility];  Service: Endoscopy;  Laterality: N/A;  Possible Dil  . Balloon dilation N/A 04/09/2014    Procedure: BALLOON DILATION;  Surgeon: Inda Castle, MD;  Location: [facility];  Service: Endoscopy;  Laterality: N/A;  . Esophageal dilation     History reviewed. No pertinent family history. History  Substance Use Topics  . Smoking status: Former Smoker -- 2 years    Types: Cigarettes    Quit date: 11/27/1994  . Smokeless tobacco: Never Used  . Alcohol Use: No    Review of Systems  Constitutional: Positive for appetite change and fatigue. Negative for fever.  Respiratory: Negative for cough and shortness of breath.   Cardiovascular: Negative for chest pain.  Gastrointestinal: Negative for nausea, vomiting and abdominal pain.  Neurological: Positive for weakness.  All other systems reviewed and are negative.     Allergies  Ramipril  Home Medications   Prior to Admission medications   Medication Sig Start Date End Date Taking? Authorizing Provider  acetaminophen (TYLENOL) 325 MG tablet Take 650 mg by mouth every 6 (six) hours as needed.  Historical Provider, MD  aspirin EC 81 MG tablet Take 1 tablet (81 mg total) by mouth daily. 09/29/13   Larey Dresser, MD  atorvastatin (LIPITOR) 80 MG tablet TAKE 1 TABLET AT BEDTIME 02/19/14   Renato Shin, MD  carvedilol (COREG) 12.5 MG tablet Take 1 tablet (12.5 mg total) by mouth 2 (two) times daily with a meal. 02/04/14   Larey Dresser, MD  citalopram  (CELEXA) 10 MG tablet Take 1 tablet (10 mg total) by mouth daily. 02/05/14 02/05/15  Renato Shin, MD  clopidogrel (PLAVIX) 75 MG tablet Take 1 tablet (75 mg total) by mouth daily. 04/21/14   Larey Dresser, MD  co-enzyme Q-10 30 MG capsule Take 30 mg by mouth daily.      Historical Provider, MD  cyclobenzaprine (FLEXERIL) 10 MG tablet Take 1 tablet (10 mg total) by mouth 3 (three) times daily as needed for muscle spasms. 04/14/14   Renato Shin, MD  furosemide (LASIX) 80 MG tablet 1/2 tablet (40mg ) two times a day 04/21/14   Larey Dresser, MD  glucose blood (ONE TOUCH ULTRA TEST) test strip Use as directed twice daily dx 250.01 03/02/14   Renato Shin, MD  HYDROcodone-acetaminophen (NORCO) 10-325 MG per tablet Take 1 tablet by mouth every 4 (four) hours as needed. 04/08/14   Renato Shin, MD  insulin lispro (HUMALOG) 100 UNIT/ML injection Inject into the skin 3x a day (just before each meal) 04-30-13 units 10/29/13   Renato Shin, MD  Insulin Pen Needle (BD PEN NEEDLE NANO U/F) 32G X 4 MM MISC Use as directed four times daily 02/15/12   Renato Shin, MD  losartan (COZAAR) 50 MG tablet 1/2 tablet (total 25mg ) daily 02/02/14   Larey Dresser, MD  Multiple Vitamin (MULTIVITAMIN) tablet Take 1 tablet by mouth daily.      Historical Provider, MD  nitroGLYCERIN (NITROSTAT) 0.4 MG SL tablet Place 1 tablet (0.4 mg total) under the tongue every 5 (five) minutes as needed for chest pain. 05/24/12   Renella Cunas, MD  oxyCODONE-acetaminophen (PERCOCET) 10-325 MG per tablet Take 1 tablet by mouth every 4 (four) hours as needed for pain. 04/14/14   Renato Shin, MD  prochlorperazine (COMPAZINE) 10 MG tablet Take 10 mg by mouth every 6 (six) hours as needed for nausea or vomiting.    Historical Provider, MD  spironolactone (ALDACTONE) 25 MG tablet 1 tablet daily 02/02/14   Larey Dresser, MD  traMADol (ULTRAM) 50 MG tablet Take 50 mg by mouth as needed.    Historical Provider, MD   BP 87/45  Pulse 87  Temp(Src) 98 F (36.7  C) (Oral)  Resp 22  Ht 5\' 7"  (1.702 m)  Wt 151 lb (68.493 kg)  BMI 23.64 kg/m2  SpO2 99% Physical Exam  Nursing note and vitals reviewed. Constitutional: He is oriented to person, place, and time. He appears well-developed and well-nourished. No distress.  HENT:  Head: Normocephalic and atraumatic.  Mouth/Throat: Mucous membranes are dry. No oropharyngeal exudate.  Eyes: EOM are normal. Pupils are equal, round, and reactive to light.  Neck: Normal range of motion. Neck supple.  Cardiovascular: Normal rate and regular rhythm.  Exam reveals no friction rub.   No murmur heard. Pulmonary/Chest: Effort normal and breath sounds normal. No respiratory distress. He has no wheezes. He has no rales.  Abdominal: He exhibits no distension. There is no tenderness. There is no rebound.  Musculoskeletal: Normal range of motion. He exhibits no edema.  Neurological: He is  alert and oriented to person, place, and time.  Skin: He is not diaphoretic.    ED Course  Procedures (including critical care time) Labs Review Labs Reviewed  CBC  COMPREHENSIVE METABOLIC PANEL  URINALYSIS, ROUTINE W REFLEX MICROSCOPIC  I-STAT CG4 LACTIC ACID, ED    Imaging Review Dg Chest Port 1 View  04/24/2014   CLINICAL DATA:  73 year old male with hypotension. Pain. Initial encounter.  EXAM: PORTABLE CHEST - 1 VIEW  COMPARISON:  03/14/2014 and earlier.  FINDINGS: Portable AP upright view at 2017 hrs. New since last month is indistinct 3 cm nodular opacity in the left lung apex. There seems to be some additional associated extra pulmonary density near the left shoulder (horizontal arrow).  Otherwise improved lung base ventilation. Lung parenchyma elsewhere appears stable with no pneumothorax or effusion. Stable cardiomegaly and mediastinal contours. Stable left chest cardiac AICD. Visualized tracheal air column is within normal limits.  IMPRESSION: 1. A left upper lobe 3 cm pulmonary nodule is new since last month,  suggesting either infectious etiology or artifact. PA and lateral chest radiographs would be valuable when possible. Alternatively, chest CT (IV contrast preferred) could be utilized. 2. Improved bibasilar ventilation since April. No other new cardiopulmonary abnormality identified.   Electronically Signed   By: Lars Pinks M.D.   On: 04/24/2014 20:30     EKG Interpretation   Date/Time:  Friday Apr 24 2014 19:55:54 EDT Ventricular Rate:  84 PR Interval:  151 QRS Duration: 124 QT Interval:  374 QTC Calculation: 442 R Axis:   28 Text Interpretation:  Pacemaker spikes or artifacts Sinus rhythm Multiform  ventricular premature complexes Nonspecific intraventricular conduction  delay Anteroseptal infarct, old Abnormal T, consider ischemia, lateral  leads Similar to prior Confirmed by Mingo Amber  MD, [place] ([id number]) on 04/24/2014  8:07:18 PM      MDM   Final diagnoses:  Dehydration  Acute renal failure  Hyponatremia    69M presents with hypotension and orthostatic dizziness. Hx of esophageal cancer 9 months ago that is now in remission after chemo and radiation. Unfortunately, he developed esophgeal strictures after the esophageal radiation that required dilation. Dr. Deatra Ina did a dilation 2 weeks ago. Since then, decreased PO intake. Saw Cardiology 1 week ago, noted to have increased creatinine (around 1.5, baseline around 1), instructed to decrease AM lasix dose. Continued with decreased PO intake this past week and was having orthostatic dizziness and falls today. No syncope. No CP, SOB. No N/V/D. On exam, hypotensive, initial BP 83/54 on my exam, no tachycardia. Dry mucus membranes. Will draw labs, obtain orthostatics, and gently hydrate. Labs show ARF, hyponatremia. Admitted to Dr. Humphrey Rolls.  Osvaldo Shipper, MD 04/24/14 [phone]

## 2014-04-24 NOTE — ED Notes (Signed)
Introduced self to pt and family at bedside,  Pt is alert and oriented and denies pain presently

## 2014-04-24 NOTE — ED Notes (Signed)
Pt. Attempted to give urine but unsuccessful. 

## 2014-04-24 NOTE — H&P (Signed)
[facility] History and Physical  Billy Bush [id number] DOB: February 21, 1941 DOA: 04/24/2014  Referring physician: Murlean Caller, MD PCP: Renato Shin, MD   Chief Complaint: weakness  HPI: Billy Bush is a 73 y.o. male with a history of esophageal carcinoma who presents to the ED with weakness. He states that he was unable to get and stay up on his feet. Patient has been diagnosed with esophageal carcinoma about a year ago, He has received chemo and radiation earlier this year. As a consequence he developed esophageal strictures. He recently underwent a dilatation about 2 weeks ago. Patient states that he has had decreased oral intake. He has been taking all his blood pressure medications and also his diuretics however. He denies having any chest pain. He has not had any syncope. He has no edema noted. He has no headaches noted. He does admit to having some right flank pain and also has had some right shoulder blade pain.   Review of Systems:  Constitutional:  No weight loss, night sweats, Fevers, chills, fatigue.  HEENT:  No headaches,nasal congestion, post nasal drip,  Cardio-vascular:  No chest pain, Orthopnea, PND,dizziness, palpitations  GI:  No heartburn, indigestion, abdominal pain, nausea, vomiting Resp:  No shortness of breath with exertion or at rest. No excess mucus, no productive cough, No non-productive cough, No coughing up of blood  Skin:  no rash or lesions.  GU:  no dysuria, ++change in color of urine  ++flank pain.  Musculoskeletal:  No joint pain or swelling. No decreased range of motion. No back pain.  Psych:  No change in mood or affect. No depression or anxiety. No memory loss.   Past Medical History  Diagnosis Date  . Dyslipidemia   . Hypertension   . History of colonic polyps   . Thrombocytopenia   . Nonproliferative diabetic retinopathy NOS(362.03)   . Impotence of organic origin   . Iron deficiency anemia, unspecified   .  Unspecified hereditary and idiopathic peripheral neuropathy   . Diabetes mellitus   . Depressive disorder, not elsewhere classified   . Coronary artery disease     a. s/p CABG 1997; b. LHC 04/2007: Distal left main 80% treated with PCI (Cypher DES), LIMA-LAD patent, SVG-OM patent, SVG-PDA patent, EF 30% with inferior HK  . Chronic systolic heart failure     a. Echo 09/2011: EF 30-35%, apical AK, basal inferior AK, mild AI, moderate MR, moderate LAE  . Anxiety state, unspecified   . Ischemic cardiomyopathy   . S/P ICD (internal cardiac defibrillator) procedure   . Esophageal cancer   . Elevated PSA   . History of radiation therapy 08/06/13-09/12/13    esophageal  . Esophageal stricture    Past Surgical History  Procedure Laterality Date  . Coronary artery bypass graft      X 3  . Cardiac catheterization  05/23/07  . Insert / replace / remove pacemaker  07/05/07    [employer] SINGLE-CHAMBER DEFIBRILLATOR, Cristopher Peru, MD  . Esophagogastroduodenoscopy N/A 07/08/2013    Procedure: ESOPHAGOGASTRODUODENOSCOPY (EGD) with possible Dilation.;  Surgeon: Inda Castle, MD;  Location: [facility];  Service: Endoscopy;  Laterality: N/A;  . Esophagogastroduodenoscopy N/A 04/09/2014    Procedure: ESOPHAGOGASTRODUODENOSCOPY (EGD);  Surgeon: Inda Castle, MD;  Location: [facility];  Service: Endoscopy;  Laterality: N/A;  Possible Dil  . Balloon dilation N/A 04/09/2014    Procedure: BALLOON DILATION;  Surgeon: Inda Castle, MD;  Location: [facility];  Service: Endoscopy;  Laterality:  N/A;  . Esophageal dilation     Social History:  reports that he quit smoking about 19 years ago. His smoking use included Cigarettes. He smoked 0.00 packs per day for 2 years. He has never used smokeless tobacco. He reports that he does not drink alcohol or use illicit drugs.  Allergies  Allergen Reactions  . Ramipril Other (See Comments)    cough    History reviewed. No pertinent family history.   Prior to  Admission medications   Medication Sig Start Date End Date Taking? Authorizing Provider  acetaminophen (TYLENOL) 325 MG tablet Take 325 mg by mouth every 6 (six) hours as needed for moderate pain.    Yes Historical Provider, MD  aspirin EC 81 MG tablet Take 1 tablet (81 mg total) by mouth daily. 09/29/13  Yes Larey Dresser, MD  atorvastatin (LIPITOR) 80 MG tablet Take 80 mg by mouth at bedtime.   Yes Historical Provider, MD  carvedilol (COREG) 12.5 MG tablet Take 1 tablet (12.5 mg total) by mouth 2 (two) times daily with a meal. 02/04/14  Yes Larey Dresser, MD  citalopram (CELEXA) 10 MG tablet Take 1 tablet (10 mg total) by mouth daily. 02/05/14 02/05/15 Yes Renato Shin, MD  clopidogrel (PLAVIX) 75 MG tablet Take 1 tablet (75 mg total) by mouth daily. 04/21/14  Yes Larey Dresser, MD  co-enzyme Q-10 30 MG capsule Take 30 mg by mouth daily.     Yes Historical Provider, MD  furosemide (LASIX) 80 MG tablet Take 40 mg by mouth 2 (two) times daily.  04/21/14  Yes Larey Dresser, MD  HYDROcodone-acetaminophen [facility]) 10-325 MG per tablet Take 1 tablet by mouth every 4 (four) hours as needed. 04/08/14  Yes Renato Shin, MD  insulin lispro (HUMALOG) 100 UNIT/ML injection Inject into the skin 3x a day (just before each meal) 04-30-13 units 10/29/13  Yes Renato Shin, MD  losartan (COZAAR) 50 MG tablet Take 25 mg by mouth daily. 02/02/14  Yes Larey Dresser, MD  Multiple Vitamin (MULTIVITAMIN) tablet Take 1 tablet by mouth daily.     Yes Historical Provider, MD  nitroGLYCERIN (NITROSTAT) 0.4 MG SL tablet Place 1 tablet (0.4 mg total) under the tongue every 5 (five) minutes as needed for chest pain. 05/24/12  Yes Renella Cunas, MD  ondansetron (ZOFRAN) 4 MG tablet Take 4 mg by mouth 3 (three) times daily before meals. 30 min prior to meals   Yes Historical Provider, MD  oxyCODONE-acetaminophen (PERCOCET) 10-325 MG per tablet Take 1 tablet by mouth every 4 (four) hours as needed for pain. 04/14/14  Yes Renato Shin,  MD  sodium chloride (OCEAN) 0.65 % SOLN nasal spray Place 1 spray into both nostrils as needed for congestion.   Yes Historical Provider, MD  spironolactone (ALDACTONE) 25 MG tablet Take 25 mg by mouth daily. 02/02/14  Yes Larey Dresser, MD  glucose blood (ONE TOUCH ULTRA TEST) test strip Use as directed twice daily dx 250.01 03/02/14   Renato Shin, MD  Insulin Pen Needle (BD PEN NEEDLE NANO U/F) 32G X 4 MM MISC Use as directed four times daily 02/15/12   Renato Shin, MD   Physical Exam: Filed Vitals:   04/24/14 2200  BP: 87/45  Pulse: 80  Temp:   Resp: 18    BP 87/45  Pulse 80  Temp(Src) 98 F (36.7 C) (Oral)  Resp 18  Ht 5\' 7"  (1.702 m)  Wt 68.493 kg (151 lb)  BMI 23.64 kg/m2  SpO2 94%  General:  Appears calm and comfortable Eyes: PERRL, normal lids, irises & conjunctiva ENT: grossly normal hearing, lips & tongue Neck: no LAD, masses or thyromegaly Cardiovascular: RRR, no m/r/g. No LE edema. Respiratory: CTA bilaterally, no w/r/r. Normal respiratory effort. Abdomen: soft, ntnd ++right flank tenderness Skin: no rash or induration seen on limited exam Musculoskeletal: grossly normal tone BUE/BLE Psychiatric: grossly normal mood and affect, speech fluent and appropriate Neurologic: grossly non-focal.          Labs on Admission:  Basic Metabolic Panel:  Recent Labs Lab 04/24/14 2001  NA 124*  K 4.7  CL 85*  CO2 23  GLUCOSE 167*  BUN 42*  CREATININE 2.43*  CALCIUM 9.4   Liver Function Tests:  Recent Labs Lab 04/24/14 2001  AST 60*  ALT 18  ALKPHOS 209*  BILITOT 0.9  PROT 6.8  ALBUMIN 3.1*   No results found for this basename: LIPASE, AMYLASE,  in the last 168 hours No results found for this basename: AMMONIA,  in the last 168 hours CBC:  Recent Labs Lab 04/24/14 2001  WBC 11.5*  HGB 9.1*  HCT 26.7*  MCV 87.8  PLT 196   Cardiac Enzymes: No results found for this basename: CKTOTAL, CKMB, CKMBINDEX, TROPONINI,  in the last 168 hours  BNP  (last 3 results)  Recent Labs  01/05/14 0814 01/15/14 1541 04/17/14 0847  PROBNP 390.0* 292.0* 456.0*   CBG: No results found for this basename: GLUCAP,  in the last 168 hours  Radiological Exams on Admission: Dg Chest Port 1 View  04/24/2014   CLINICAL DATA:  73 year old male with hypotension. Pain. Initial encounter.  EXAM: PORTABLE CHEST - 1 VIEW  COMPARISON:  03/14/2014 and earlier.  FINDINGS: Portable AP upright view at 2017 hrs. New since last month is indistinct 3 cm nodular opacity in the left lung apex. There seems to be some additional associated extra pulmonary density near the left shoulder (horizontal arrow).  Otherwise improved lung base ventilation. Lung parenchyma elsewhere appears stable with no pneumothorax or effusion. Stable cardiomegaly and mediastinal contours. Stable left chest cardiac AICD. Visualized tracheal air column is within normal limits.  IMPRESSION: 1. A left upper lobe 3 cm pulmonary nodule is new since last month, suggesting either infectious etiology or artifact. PA and lateral chest radiographs would be valuable when possible. Alternatively, chest CT (IV contrast preferred) could be utilized. 2. Improved bibasilar ventilation since April. No other new cardiopulmonary abnormality identified.   Electronically Signed   By: Lars Pinks M.D.   On: 04/24/2014 20:30    EKG: Independently reviewed. Occasional PVCs noted  Assessment/Plan Principal Problem:   Hyponatremia Active Problems:   DIABETES MELLITUS, TYPE I   Other and unspecified hyperlipidemia   CORONARY ARTERY DISEASE   Hypotension   1. Hyponatremia -will hold diuretics for now -start on IV fluids -repeat labs in am  2. Right Flank Pain/Shoulder Pain -not sure if there is underlying renal infection But wbc is elevated -will get blood cultures -check urinalysis -Will start on rocephin   3. Hypovolemia/Hypotension -on multiple diuretics with poor po intake -will fluid resuscitate-appears to  be responding to volume repletion -repeat labs in am  4. Diabetes Mellitus Type II -will check A1C -continue with insulin  5. CAD -currently appears to be stable -will continue with home medications  6. Hyperlipidemia -will continue with home medications  7. Elevated BUN/Creatinine -likely related to dehydration volume depletion -will hydrate   Code Status: Full Code (must  indicate code status--if unknown or must be presumed, indicate so) Family Communication: Wife and Daughter in [place] (indicate person spoken with, if applicable, with phone number if by telephone) Disposition Plan: Home (indicate anticipated LOS)  Time spent: 41min  Saadat A Khan [facility] Pager [phone]  **Disclaimer: This note may have been dictated with voice recognition software. Similar sounding words can inadvertently be transcribed and this note may contain transcription errors which may not have been corrected upon publication of note.**

## 2014-04-24 NOTE — ED Notes (Signed)
Since Wednesday pt has become progressively weak and today has noticed an altered gait. Pt notes decreased appetite for the past month, and is having intermittent nausea. Wife at bedside estimates that he has taken in 12-16 oz, a small amount of cantalope and half an egg. Pt also complains of back pain. Pt feels dizzy when getting up.

## 2014-04-24 NOTE — Telephone Encounter (Signed)
Message from pt's wife reporting he is not eating and drinking. Losing weight. Had esophagus dilated three weeks ago, having dry heaves. Reviewed with providers, pt scheduled for lab office 6/1.

## 2014-04-25 ENCOUNTER — Inpatient Hospital Stay (HOSPITAL_COMMUNITY): Payer: Medicare HMO

## 2014-04-25 DIAGNOSIS — N179 Acute kidney failure, unspecified: Secondary | ICD-10-CM | POA: Diagnosis present

## 2014-04-25 DIAGNOSIS — E860 Dehydration: Secondary | ICD-10-CM

## 2014-04-25 DIAGNOSIS — E871 Hypo-osmolality and hyponatremia: Secondary | ICD-10-CM

## 2014-04-25 DIAGNOSIS — I959 Hypotension, unspecified: Secondary | ICD-10-CM

## 2014-04-25 LAB — GLUCOSE, CAPILLARY
GLUCOSE-CAPILLARY: 261 mg/dL — AB (ref 70–99)
GLUCOSE-CAPILLARY: 261 mg/dL — AB (ref 70–99)
Glucose-Capillary: 170 mg/dL — ABNORMAL HIGH (ref 70–99)
Glucose-Capillary: 285 mg/dL — ABNORMAL HIGH (ref 70–99)

## 2014-04-25 LAB — COMPREHENSIVE METABOLIC PANEL
ALK PHOS: 168 U/L — AB (ref 39–117)
ALT: 14 U/L (ref 0–53)
AST: 48 U/L — ABNORMAL HIGH (ref 0–37)
Albumin: 2.4 g/dL — ABNORMAL LOW (ref 3.5–5.2)
BILIRUBIN TOTAL: 0.7 mg/dL (ref 0.3–1.2)
BUN: 35 mg/dL — ABNORMAL HIGH (ref 6–23)
CHLORIDE: 90 meq/L — AB (ref 96–112)
CO2: 20 mEq/L (ref 19–32)
Calcium: 8.4 mg/dL (ref 8.4–10.5)
Creatinine, Ser: 1.81 mg/dL — ABNORMAL HIGH (ref 0.50–1.35)
GFR calc non Af Amer: 36 mL/min — ABNORMAL LOW (ref >90)
GFR, EST AFRICAN AMERICAN: 42 mL/min — AB (ref >90)
GLUCOSE: 172 mg/dL — AB (ref 70–99)
POTASSIUM: 4.3 meq/L (ref 3.7–5.3)
Sodium: 125 mEq/L — ABNORMAL LOW (ref 137–147)
Total Protein: 5.5 g/dL — ABNORMAL LOW (ref 6.0–8.3)

## 2014-04-25 LAB — CBC
HCT: 23.5 % — ABNORMAL LOW (ref 39.0–52.0)
Hemoglobin: 7.8 g/dL — ABNORMAL LOW (ref 13.0–17.0)
MCH: 29.8 pg (ref 26.0–34.0)
MCHC: 33.2 g/dL (ref 30.0–36.0)
MCV: 89.7 fL (ref 78.0–100.0)
PLATELETS: 139 10*3/uL — AB (ref 150–400)
RBC: 2.62 MIL/uL — ABNORMAL LOW (ref 4.22–5.81)
RDW: 13.7 % (ref 11.5–15.5)
WBC: 8.3 10*3/uL (ref 4.0–10.5)

## 2014-04-25 LAB — PROTIME-INR
INR: 1.24 (ref 0.00–1.49)
Prothrombin Time: 15.3 seconds — ABNORMAL HIGH (ref 11.6–15.2)

## 2014-04-25 LAB — HEMOGLOBIN A1C
HEMOGLOBIN A1C: 7.7 % — AB (ref <5.7)
Mean Plasma Glucose: 174 mg/dL — ABNORMAL HIGH (ref <117)

## 2014-04-25 LAB — TSH: TSH: 2.76 u[IU]/mL (ref 0.350–4.500)

## 2014-04-25 MED ORDER — CARVEDILOL 6.25 MG PO TABS
6.2500 mg | ORAL_TABLET | Freq: Two times a day (BID) | ORAL | Status: DC
  Administered 2014-04-25: 6.25 mg via ORAL
  Filled 2014-04-25 (×3): qty 1

## 2014-04-25 MED ORDER — SODIUM CHLORIDE 0.9 % IV SOLN
INTRAVENOUS | Status: AC
  Administered 2014-04-25: 21:00:00 via INTRAVENOUS

## 2014-04-25 MED ORDER — SODIUM CHLORIDE 0.9 % IV BOLUS (SEPSIS)
1000.0000 mL | Freq: Once | INTRAVENOUS | Status: AC
  Administered 2014-04-25: 1000 mL via INTRAVENOUS

## 2014-04-25 MED ORDER — SODIUM CHLORIDE 0.9 % IV BOLUS (SEPSIS)
500.0000 mL | Freq: Once | INTRAVENOUS | Status: AC
  Administered 2014-04-25: 500 mL via INTRAVENOUS

## 2014-04-25 MED ORDER — CARVEDILOL 6.25 MG PO TABS
6.2500 mg | ORAL_TABLET | Freq: Two times a day (BID) | ORAL | Status: DC
  Administered 2014-04-25 – 2014-05-01 (×9): 6.25 mg via ORAL
  Filled 2014-04-25 (×14): qty 1

## 2014-04-25 MED ORDER — ASPIRIN EC 81 MG PO TBEC
81.0000 mg | DELAYED_RELEASE_TABLET | Freq: Every day | ORAL | Status: DC
  Administered 2014-04-26 – 2014-05-01 (×6): 81 mg via ORAL
  Filled 2014-04-25 (×6): qty 1

## 2014-04-25 MED ORDER — INSULIN ASPART 100 UNIT/ML ~~LOC~~ SOLN
0.0000 [IU] | Freq: Three times a day (TID) | SUBCUTANEOUS | Status: DC
Start: 2014-04-25 — End: 2014-04-27
  Administered 2014-04-25: 5 [IU] via SUBCUTANEOUS
  Administered 2014-04-26 (×2): 3 [IU] via SUBCUTANEOUS
  Administered 2014-04-26 – 2014-04-27 (×3): 2 [IU] via SUBCUTANEOUS

## 2014-04-25 NOTE — Progress Notes (Signed)
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 In the ED, patient was noted to hypotensive, sodium 124, creatinine 2.43, hemoglobin 9.1 and chest x-ray showing new left upper lobe pulmonary nodule.   Assessment/Plan:  1. Dehydration: Secondary to poor oral intake and diuretics. Improving with IV fluids. Continue gentle IV hydration for additional 24 hours and reassess. 2. Acute renal failure: Creatinine in March was 1 and on 5/22 was 1.5. Likely secondary to dehydration, hypotension and ACEI/ARB. Improving. Brief IV fluids and monitor renal functions. 3. Hyponatremia: May be secondary to dehydration versus SIADH. Stable. Asymptomatic. Follow BMP with IV hydration. 4. Hypotension/orthostatic hypotension: Secondary to dehydration. Reduce dose of carvedilol. Continue IV fluids. Monitor. 5. Anemia: Likely secondary to chronic disease and malignancy. No reported bleeding. Follow CBCs and transfuse hemoglobin less than 7 g per DL. 6. Thrombocytopenia: Follow daily CBCs. 7. History of CAD/CABG & DES 2 left main/ischemic cardiomyopathy LVEF 25-30%/St. Jude ICD: Recently saw cardiology on 5/23-continue Plavix, aspirin, statins. Reduced dose of carvedilol secondary to hypotension. Temporarily hold ramipril secondary to hypotension and acute on chronic kidney  disease. 8. History of chronic systolic CHF: Currently hypovolemic/hypotensive. Brief IV fluids. Apparently runs soft blood pressures. Hold Lasix, spironolactone, ACEI/ARB temporarily. Monitor closely 9. History of hyperlipidemia: Continue statins 10. Back pain: Ongoing for last 1 month. States that it's better controlled after his oncologist started him on some new medications. We'll defer evaluation to medical oncology-patient has appointment to see Dr. Benay Spice on 6/1. No features to suggest UTI-we'll discontinue Rocephin 11. Esophageal cancer: Management per oncology. New lung nodule. We'll get 2 view chest x-ray and may eventually need CT chest if renal functions permit. 12. Uncontrolled DM 1 with renal complications: SSI  Code Status: Full Family Communication: Discussed with patient's son and grandson at bedside. Disposition Plan: Home when medically stable.   Consultants:  None  Procedures:  None  Antibiotics:  IV Rocephin 5/29-DC'd   Subjective: States feels much better-stronger. Denies dizziness, lightheadedness, chest pain or dyspnea. Back pain controlled. States that he slept well last night after a long time.  Objective: Filed Vitals:   04/25/14 0010 04/25/14 0223 04/25/14 0547 04/25/14 0848  BP: 89/36  98/47 92/48  Pulse: 91  84 87  Temp: 98.4 F (36.9 C)  98.7 F (37.1 C)   TempSrc: Oral  Oral   Resp: 16  20   Height:      Weight:      SpO2: 95% 99% 99%     Intake/Output Summary (Last 24 hours) at 04/25/14 1301 Last data filed at 04/25/14 0622  Gross per 24 hour  Intake 2666.67 ml  Output      0 ml  Net 2666.67 ml   Filed Weights   04/24/14 1946  Weight: 68.493 kg (151 lb)     Exam:  General exam: Pleasant elderly frail male sitting up comfortably in bed. Family in room. Respiratory system: Distant breath sounds but clear to auscultation. No increased work of breathing. Cardiovascular system:  S1 & S2 heard, RRR. No JVD, murmurs, gallops,  clicks or pedal edema. Telemetry: Sinus rhythm with wide QRS and occasional PVCs. Gastrointestinal system: Abdomen is nondistended, soft and nontender. Normal bowel sounds heard. Central nervous system: Alert and oriented. No focal neurological deficits. Extremities: Symmetric 5 x 5 power.   Data Reviewed: Basic Metabolic Panel:  Recent Labs Lab 04/24/14 2001 04/25/14 0445  NA 124* 125*  K 4.7 4.3  CL 85* 90*  CO2 23 20  GLUCOSE 167* 172*  BUN 42* 35*  CREATININE 2.43* 1.81*  CALCIUM 9.4 8.4   Liver Function Tests:  Recent Labs Lab 04/24/14 2001 04/25/14 0445  AST 60* 48*  ALT 18 14  ALKPHOS 209* 168*  BILITOT 0.9 0.7  PROT 6.8 5.5*  ALBUMIN 3.1* 2.4*   No results found for this basename: LIPASE, AMYLASE,  in the last 168 hours No results found for this basename: AMMONIA,  in the last 168 hours CBC:  Recent Labs Lab 04/24/14 2001 04/25/14 0445  WBC 11.5* 8.3  HGB 9.1* 7.8*  HCT 26.7* 23.5*  MCV 87.8 89.7  PLT 196 139*   Cardiac Enzymes: No results found for this basename: CKTOTAL, CKMB, CKMBINDEX, TROPONINI,  in the last 168 hours BNP (last 3 results)  Recent Labs  01/05/14 0814 01/15/14 1541 04/17/14 0847  PROBNP 390.0* 292.0* 456.0*   CBG:  Recent Labs Lab 04/25/14 0824  GLUCAP 170*    No results found for this or any previous visit (from the past 240 hour(s)).        Studies: Dg Chest Port 1 View  04/24/2014   CLINICAL DATA:  73 year old male with hypotension. Pain. Initial encounter.  EXAM: PORTABLE CHEST - 1 VIEW  COMPARISON:  03/14/2014 and earlier.  FINDINGS: Portable AP upright view at 2017 hrs. New since last month is indistinct 3 cm nodular opacity in the left lung apex. There seems to be some additional associated extra pulmonary density near the left shoulder (horizontal arrow).  Otherwise improved lung base ventilation. Lung parenchyma elsewhere appears stable with no pneumothorax or effusion. Stable cardiomegaly and  mediastinal contours. Stable left chest cardiac AICD. Visualized tracheal air column is within normal limits.  IMPRESSION: 1. A left upper lobe 3 cm pulmonary nodule is new since last month, suggesting either infectious etiology or artifact. PA and lateral chest radiographs would be valuable when possible. Alternatively, chest CT (IV contrast preferred) could be utilized. 2. Improved bibasilar ventilation since April. No other new cardiopulmonary abnormality identified.   Electronically Signed   By: Lars Pinks M.D.   On: 04/24/2014 20:30        Scheduled Meds: . aspirin EC  325 mg Oral Daily  . atorvastatin  80 mg Oral QHS  . carvedilol  6.25 mg Oral BID WC  . cefTRIAXone (ROCEPHIN)  IV  1 g Intravenous Q24H  . citalopram  10 mg Oral Daily  . clopidogrel  75 mg Oral Daily  . docusate sodium  100 mg Oral BID  . folic acid  1 mg Oral Daily  . heparin  5,000 Units Subcutaneous 3 times per day  . multivitamin with minerals  1 tablet Oral Daily  . sodium chloride  3 mL Intravenous Q12H  . thiamine  100 mg Oral Daily   Continuous Infusions: . sodium chloride 100 mL/hr at 04/25/14 0848    Principal Problem:   Hyponatremia Active Problems:   DIABETES MELLITUS, TYPE I   Other and unspecified hyperlipidemia   CORONARY ARTERY  DISEASE   Hypotension    Time spent: 45 minutes    Modena Jansky, MD, FACP, [facility] Pager [phone]  If 7PM-7AM, please contact night-coverage [email or web address] Password [email or web address] 04/25/2014, 1:01 PM    LOS: 1 day

## 2014-04-26 ENCOUNTER — Inpatient Hospital Stay (HOSPITAL_COMMUNITY): Payer: Medicare HMO

## 2014-04-26 DIAGNOSIS — R1310 Dysphagia, unspecified: Secondary | ICD-10-CM

## 2014-04-26 DIAGNOSIS — E43 Unspecified severe protein-calorie malnutrition: Secondary | ICD-10-CM | POA: Diagnosis present

## 2014-04-26 DIAGNOSIS — K222 Esophageal obstruction: Secondary | ICD-10-CM

## 2014-04-26 DIAGNOSIS — C155 Malignant neoplasm of lower third of esophagus: Secondary | ICD-10-CM

## 2014-04-26 LAB — BASIC METABOLIC PANEL
BUN: 23 mg/dL (ref 6–23)
CHLORIDE: 96 meq/L (ref 96–112)
CO2: 22 meq/L (ref 19–32)
CREATININE: 1.24 mg/dL (ref 0.50–1.35)
Calcium: 8.7 mg/dL (ref 8.4–10.5)
GFR calc non Af Amer: 57 mL/min — ABNORMAL LOW (ref >90)
GFR, EST AFRICAN AMERICAN: 66 mL/min — AB (ref >90)
Glucose, Bld: 260 mg/dL — ABNORMAL HIGH (ref 70–99)
POTASSIUM: 4.3 meq/L (ref 3.7–5.3)
SODIUM: 131 meq/L — AB (ref 137–147)

## 2014-04-26 LAB — GLUCOSE, CAPILLARY
Glucose-Capillary: 245 mg/dL — ABNORMAL HIGH (ref 70–99)
Glucose-Capillary: 232 mg/dL — ABNORMAL HIGH (ref 70–99)
Glucose-Capillary: 218 mg/dL — ABNORMAL HIGH (ref 70–99)

## 2014-04-26 LAB — CBC
HCT: 23.2 % — ABNORMAL LOW (ref 39.0–52.0)
Hemoglobin: 7.8 g/dL — ABNORMAL LOW (ref 13.0–17.0)
MCH: 30.5 pg (ref 26.0–34.0)
MCHC: 33.6 g/dL (ref 30.0–36.0)
MCV: 90.6 fL (ref 78.0–100.0)
Platelets: 141 10*3/uL — ABNORMAL LOW (ref 150–400)
RBC: 2.56 MIL/uL — AB (ref 4.22–5.81)
RDW: 14 % (ref 11.5–15.5)
WBC: 8.4 10*3/uL (ref 4.0–10.5)

## 2014-04-26 MED ORDER — SODIUM CHLORIDE 0.9 % IV SOLN
INTRAVENOUS | Status: AC
  Administered 2014-04-26 – 2014-04-27 (×2): via INTRAVENOUS

## 2014-04-26 MED ORDER — INSULIN GLARGINE 100 UNIT/ML ~~LOC~~ SOLN
10.0000 [IU] | Freq: Every day | SUBCUTANEOUS | Status: DC
  Administered 2014-04-26 – 2014-04-30 (×5): 10 [IU] via SUBCUTANEOUS
  Filled 2014-04-26 (×6): qty 0.1

## 2014-04-26 MED ORDER — DIPHENHYDRAMINE HCL 25 MG PO CAPS
25.0000 mg | ORAL_CAPSULE | Freq: Once | ORAL | Status: AC
  Administered 2014-04-26: 25 mg via ORAL
  Filled 2014-04-26: qty 1

## 2014-04-26 NOTE — Progress Notes (Signed)
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 In the ED, patient was noted to hypotensive, sodium 124, creatinine 2.43, hemoglobin 9.1 and chest x-ray showing new left upper lobe pulmonary nodule.   Assessment/Plan:  1. Dehydration: Secondary to poor oral intake and diuretics. Improved with IV fluids. Patient having difficulty with by mouth intake secondary to dysphagia-GI consulted 5/31. Continue gentle IV fluids while monitoring closely for decompensated CHF. 2. Acute renal failure: Creatinine in March was 1 and on 5/22 was 1.5. Likely secondary to dehydration, hypotension and ACEI/ARB. Improved. No hydronephrosis on renal ultrasound. Continue gentle IV fluids and follow BMP. 3. Hyponatremia: May be secondary to dehydration versus SIADH. Stable. Asymptomatic. Improved. 4. Hypotension/orthostatic hypotension: Secondary to dehydration. Reduced dose of carvedilol. Improved but still mildly orthostatic on BP checks this morning. 5. Anemia: Likely secondary to chronic disease and malignancy. No reported bleeding. Follow CBCs and transfuse hemoglobin less than 7 g per DL. Stable 6. Thrombocytopenia: Follow daily CBCs. Stable 7. History of CAD/CABG & DES 2 left main/ischemic cardiomyopathy LVEF 25-30%/St. Jude ICD: Recently saw cardiology on 5/23-continue  Plavix, aspirin, statins. Reduced dose of carvedilol secondary to hypotension. Temporarily hold ramipril secondary to hypotension and acute on chronic kidney disease. 8. History of chronic systolic CHF: Currently hypovolemic/hypotensive. Brief IV fluids. Apparently runs soft blood pressures. Hold Lasix, spironolactone, ACEI/ARB temporarily. Monitor closely 9. History of hyperlipidemia: Continue statins 10. Back pain: Ongoing for last 1 month. States that it's better controlled after his oncologist started him on some new medications. We'll defer evaluation to medical oncology-patient has appointment to see Dr. Benay Spice on 6/1. No features to suggest UTI-we'll discontinue Rocephin 11. Esophageal cancer/dysphagia: Management per oncology. New lung nodule on chest x-ray and possible liver metastases on renal ultrasound today. Will discuss with Dr. Benay Spice on 6/1 regarding further evaluation-? CT chest abdomen and pelvis. Patient has no symptoms to suggest pneumonia at this time. Patient complains of dysphagia on 5/31. He is status post EGD and dilatation of radiation related stricture on 5/14. GI consulted on 5/31. 12. Uncontrolled DM 1 with renal complications: SSI. Added Lantus 10 units subcutaneous daily.  Code Status: Full Family Communication: Discussed with patient's stepdaughter at bedside. Disposition Plan: Home when medically stable.   Consultants:  Gastroenterology  Procedures:  None  Antibiotics:  IV Rocephin 5/29-DC'd   Subjective: Back pain controlled. Feels stronger. Complains of dysphagia.  Objective: Filed Vitals:   04/25/14 1305 04/25/14 1501 04/25/14 2043 04/26/14 0556  BP: 107/62 93/44 103/54   Pulse: 91 85 87 86  Temp:  97.8 F (36.6 C) 98.3 F (36.8 C) 98 F (36.7 C)  TempSrc:  Oral Oral Oral  Resp:  20 20 24   Height:      Weight:      SpO2:  99% 100%     Intake/Output Summary (Last 24 hours) at 04/26/14 1230 Last data filed at 04/26/14 0938  Gross per  24 hour  Intake  11967 ml  Output    200 ml  Net  11767 ml   Filed Weights   04/24/14 1946  Weight: 68.493 kg (151 lb)     Exam:  General exam: Pleasant elderly frail male sitting up on chair attempting to eat breakfast this morning. Respiratory system: Distant breath sounds but clear to auscultation. No increased work of breathing. Cardiovascular system: S1 & S2 heard, RRR. No JVD, murmurs, gallops, clicks or pedal edema. Telemetry: Sinus rhythm with wide QRS and occasional PVCs. Gastrointestinal system: Abdomen is nondistended, soft and nontender. Normal bowel sounds heard. Central nervous system: Alert and oriented. No focal neurological deficits. Extremities: Symmetric 5 x 5 power.   Data Reviewed: Basic Metabolic Panel:  Recent Labs Lab 04/24/14 2001 04/25/14 0445 04/26/14 0442  NA 124* 125* 131*  K 4.7 4.3 4.3  CL 85* 90* 96  CO2 23 20 22   GLUCOSE 167* 172* 260*  BUN 42* 35* 23  CREATININE 2.43* 1.81* 1.24  CALCIUM 9.4 8.4 8.7   Liver Function Tests:  Recent Labs Lab 04/24/14 2001 04/25/14 0445  AST 60* 48*  ALT 18 14  ALKPHOS 209* 168*  BILITOT 0.9 0.7  PROT 6.8 5.5*  ALBUMIN 3.1* 2.4*   No results found for this basename: LIPASE, AMYLASE,  in the last 168 hours No results found for this basename: AMMONIA,  in the last 168 hours CBC:  Recent Labs Lab 04/24/14 2001 04/25/14 0445 04/26/14 0442  WBC 11.5* 8.3 8.4  HGB 9.1* 7.8* 7.8*  HCT 26.7* 23.5* 23.2*  MCV 87.8 89.7 90.6  PLT 196 139* 141*   Cardiac Enzymes: No results found for this basename: CKTOTAL, CKMB, CKMBINDEX, TROPONINI,  in the last 168 hours BNP (last 3 results)  Recent Labs  01/05/14 0814 01/15/14 1541 04/17/14 0847  PROBNP 390.0* 292.0* 456.0*   CBG:  Recent Labs Lab 04/25/14 0824 04/25/14 1457 04/25/14 1739 04/25/14 2059 04/26/14 0734  GLUCAP 170* 261* 261* 285* 245*    Recent Results (from the past 240 hour(s))  CULTURE, BLOOD (ROUTINE X 2)     Status:  None   Collection Time    04/24/14  8:05 PM      Result Value Ref Range Status   Specimen Description BLOOD LEFT ARM   Final   Special Requests BOTTLES DRAWN AEROBIC AND ANAEROBIC 5CC   Final   Culture  Setup Time     Final   Value: 04/25/2014 00:45     Performed at [facility]   Culture     Final   Value:        BLOOD CULTURE RECEIVED NO GROWTH TO DATE CULTURE WILL BE HELD FOR 5 DAYS BEFORE ISSUING A FINAL NEGATIVE REPORT     Performed at [facility]   Report Status PENDING   Incomplete  CULTURE, BLOOD (ROUTINE X 2)     Status: None   Collection Time    04/24/14 10:26 PM      Result Value Ref Range Status   Specimen Description BLOOD RIGHT ARM   Final   Special Requests BOTTLES DRAWN AEROBIC AND ANAEROBIC 5CC   Final   Culture  Setup Time     Final   Value: 04/25/2014 00:45     Performed at [facility]   Culture     Final   Value:        BLOOD CULTURE RECEIVED NO GROWTH TO DATE CULTURE WILL BE HELD FOR 5 DAYS BEFORE ISSUING A FINAL NEGATIVE REPORT     Performed at [facility]   Report Status PENDING   Incomplete          Studies: Dg Chest 2 View  04/25/2014   CLINICAL DATA:  Follow-up pulmonary nodule  EXAM: CHEST  2 VIEW  COMPARISON:  04/24/2014  FINDINGS: Left apical rounded opacity again noted measuring approximately 3 cm. There is no focal parenchymal opacity, pleural effusion, or pneumothorax. Stable cardiomediastinal silhouette. Prior CABG. Single lead cardiac pacer again noted.  The osseous structures are unremarkable.  IMPRESSION: Left apical rounded opacity again noted measuring approximately 3 cm. This may reflect round pneumonia versus abnormality within the chest wall soft tissues. The appearance is unlikely to reflect malignancy given the significant interval enlargement compared with 02/09/2014. Further evaluation with chest CT is recommended.   Electronically Signed   By: Kathreen Devoid   On: 04/25/2014 16:48   US  Renal  04/26/2014   CLINICAL DATA:  Acute renal failure, elevated creatinine, history of esophageal cancer, diabetes and hypertension  EXAM: RENAL/URINARY TRACT ULTRASOUND COMPLETE  COMPARISON:  CT abdomen pelvis - 02/09/2014  FINDINGS: Right Kidney:  Normal cortical thickness, echogenicity and size, measuring 11.9 cm in length. No focal renal lesions. No echogenic renal stones. No urinary obstruction.  Left Kidney:  Normal cortical thickness, echogenicity and size, measuring 12.1 cm in length. No focal renal lesions. No echogenic renal stones. No urinary obstruction.  Bladder:  Appears normal for degree of bladder distention.  Other: Note is made of multiple mixed echogenic solid lesions within the incidentally imaged right lobe of the liver with dominant mass measuring approximately 4.2 x 3.2 cm (image 13) with additional lesion measuring approximately 2.4 x 2.0 cm (also seen on image 13).  IMPRESSION: 1. No explanation for patient's acute renal failure. Specifically, no evidence of urinary obstruction. 2. Interval development of multiple liver lesions within the incidentally imaged right lobe of the liver. While several smaller hyperenhancing liver lesions were noted on prior abdominal CT performed 02/09/2014, these lesions appear new and larger and given history of esophageal cancer are worrisome for metastatic disease. Further evaluation with abdominal CT scan and/or dedicated abdominal ultrasound as clinically indicated.   Electronically Signed   By: Sandi Mariscal M.D.   On: 04/26/2014 08:47   Dg Chest Port 1 View  04/24/2014   CLINICAL DATA:  73 year old male with hypotension. Pain. Initial encounter.  EXAM: PORTABLE CHEST - 1 VIEW  COMPARISON:  03/14/2014 and earlier.  FINDINGS: Portable AP upright view at 2017 hrs. New since last month is indistinct 3 cm nodular opacity in the left lung apex. There seems to be some additional associated extra pulmonary density near the left shoulder (horizontal arrow).   Otherwise improved lung base ventilation. Lung parenchyma elsewhere appears stable with no pneumothorax or effusion. Stable cardiomegaly and mediastinal contours. Stable left chest cardiac AICD. Visualized tracheal air column is within normal limits.  IMPRESSION: 1. A left upper lobe 3 cm pulmonary nodule is new since last month, suggesting either infectious etiology or artifact. PA and lateral chest radiographs would be valuable when possible. Alternatively, chest CT (IV contrast preferred) could be utilized. 2. Improved bibasilar ventilation since April. No other new cardiopulmonary abnormality identified.   Electronically Signed   By: Lars Pinks M.D.   On: 04/24/2014 20:30        Scheduled Meds: . aspirin EC  81 mg Oral Daily  . atorvastatin  80 mg Oral QHS  . carvedilol  6.25 mg Oral BID WC  . citalopram  10  mg Oral Daily  . clopidogrel  75 mg Oral Daily  . docusate sodium  100 mg Oral BID  . folic acid  1 mg Oral Daily  . heparin  5,000 Units Subcutaneous 3 times per day  . insulin aspart  0-9 Units Subcutaneous TID WC  . insulin glargine  10 Units Subcutaneous Daily  . multivitamin with minerals  1 tablet Oral Daily  . sodium chloride  3 mL Intravenous Q12H  . thiamine  100 mg Oral Daily   Continuous Infusions:    Principal Problem:   Hyponatremia Active Problems:   DIABETES MELLITUS, TYPE I   Other and unspecified hyperlipidemia   ANEMIA, IRON DEFICIENCY   THROMBOCYTOPENIA   HYPERTENSION   CORONARY ARTERY DISEASE   SYSTOLIC HEART FAILURE, CHRONIC   Hypotension   Dehydration   Acute renal failure    Time spent: 45 minutes    Modena Jansky, MD, FACP, [facility]. [facility] Pager [phone]  If 7PM-7AM, please contact night-coverage [email or web address] Password [email or web address] 04/26/2014, 12:30 PM    LOS: 2 days

## 2014-04-26 NOTE — Consult Note (Signed)
Referring Provider: No ref. provider found Primary Care Physician:  Renato Shin, MD Primary Gastroenterologist:  Dr. Deatra Ina   Reason for Consultation:  Dysphagia; known esophageal cancer  HPI: Billy Bush is a 73 y.o. male with a an extensive cardiac history, s/p defrillator placement, CABG with stent placement. He is on multiple medications, including Plavix.  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  Recently underwent EGD on 514/15 by Dr. 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  Also was noted to have friable tumor still present.  He presented to the ED on 5/29 with weakness. He states that he was unable to get and stay up on his feet.  His other medical issues are being managed,, but GI has been called for recurrent complaints of dysphagia.  Upon speaking to the patient and his wife, they do not describe dysphagia or symptoms similar to what he was having prior to the EGD.  They describe something new that has only been present since his EGD with dilation.  They say that after he eats/swallows he gets a gagging sensation and brings up clear liquid material.  Food does not feel like it is getting stuck, however.  He also has had no appetite due to his severe back pain, which is under evaluation as an outpatient.  He denies pain when swallowing.   Past Medical History  Diagnosis Date  . Dyslipidemia   . Hypertension   . History of colonic polyps   . Thrombocytopenia   . Nonproliferative diabetic retinopathy NOS(362.03)   . Impotence of organic origin   . Iron deficiency anemia, unspecified   . Unspecified hereditary and idiopathic peripheral neuropathy   . Diabetes mellitus   . Depressive disorder, not elsewhere classified   . Coronary artery  disease     a. s/p CABG 1997; b. LHC 04/2007: Distal left main 80% treated with PCI (Cypher DES), LIMA-LAD patent, SVG-OM patent, SVG-PDA patent, EF 30% with inferior HK  . Chronic systolic heart failure     a. Echo 09/2011: EF 30-35%, apical AK, basal inferior AK, mild AI, moderate MR, moderate LAE  . Anxiety state, unspecified   . Ischemic cardiomyopathy   . S/P ICD (internal cardiac defibrillator) procedure   . Esophageal cancer   . Elevated PSA   . History of radiation therapy 08/06/13-09/12/13    esophageal  . Esophageal stricture     Past Surgical History  Procedure Laterality Date  . Coronary artery bypass graft      X 3  . Cardiac catheterization  05/23/07  . Insert / replace / remove pacemaker  07/05/07    [employer] SINGLE-CHAMBER DEFIBRILLATOR, Cristopher Peru, MD  . Esophagogastroduodenoscopy N/A 07/08/2013    Procedure: ESOPHAGOGASTRODUODENOSCOPY (EGD) with possible Dilation.;  Surgeon: Inda Castle, MD;  Location: [facility];  Service: Endoscopy;  Laterality: N/A;  . Esophagogastroduodenoscopy N/A 04/09/2014    Procedure: ESOPHAGOGASTRODUODENOSCOPY (EGD);  Surgeon: Inda Castle, MD;  Location: [facility];  Service: Endoscopy;  Laterality: N/A;  Possible Dil  . Balloon dilation N/A 04/09/2014    Procedure: BALLOON DILATION;  Surgeon: Inda Castle, MD;  Location: [facility];  Service: Endoscopy;  Laterality: N/A;  . Esophageal dilation      Prior to Admission medications   Medication Sig Start  Date End Date Taking? Authorizing Provider  acetaminophen (TYLENOL) 325 MG tablet Take 325 mg by mouth every 6 (six) hours as needed for moderate pain.    Yes Historical Provider, MD  aspirin EC 81 MG tablet Take 1 tablet (81 mg total) by mouth daily. 09/29/13  Yes Larey Dresser, MD  atorvastatin (LIPITOR) 80 MG tablet Take 80 mg by mouth at bedtime.   Yes Historical Provider, MD  carvedilol (COREG) 12.5 MG tablet Take 1 tablet (12.5 mg total) by mouth 2 (two) times daily with  a meal. 02/04/14  Yes Larey Dresser, MD  citalopram (CELEXA) 10 MG tablet Take 1 tablet (10 mg total) by mouth daily. 02/05/14 02/05/15 Yes Renato Shin, MD  clopidogrel (PLAVIX) 75 MG tablet Take 1 tablet (75 mg total) by mouth daily. 04/21/14  Yes Larey Dresser, MD  co-enzyme Q-10 30 MG capsule Take 30 mg by mouth daily.     Yes Historical Provider, MD  furosemide (LASIX) 80 MG tablet Take 40 mg by mouth 2 (two) times daily.  04/21/14  Yes Larey Dresser, MD  HYDROcodone-acetaminophen [facility]) 10-325 MG per tablet Take 1 tablet by mouth every 4 (four) hours as needed. 04/08/14  Yes Renato Shin, MD  insulin lispro (HUMALOG) 100 UNIT/ML injection Inject into the skin 3x a day (just before each meal) 04-30-13 units 10/29/13  Yes Renato Shin, MD  losartan (COZAAR) 50 MG tablet Take 25 mg by mouth daily. 02/02/14  Yes Larey Dresser, MD  Multiple Vitamin (MULTIVITAMIN) tablet Take 1 tablet by mouth daily.     Yes Historical Provider, MD  nitroGLYCERIN (NITROSTAT) 0.4 MG SL tablet Place 1 tablet (0.4 mg total) under the tongue every 5 (five) minutes as needed for chest pain. 05/24/12  Yes Renella Cunas, MD  ondansetron (ZOFRAN) 4 MG tablet Take 4 mg by mouth 3 (three) times daily before meals. 30 min prior to meals   Yes Historical Provider, MD  oxyCODONE-acetaminophen (PERCOCET) 10-325 MG per tablet Take 1 tablet by mouth every 4 (four) hours as needed for pain. 04/14/14  Yes Renato Shin, MD  sodium chloride (OCEAN) 0.65 % SOLN nasal spray Place 1 spray into both nostrils as needed for congestion.   Yes Historical Provider, MD  spironolactone (ALDACTONE) 25 MG tablet Take 25 mg by mouth daily. 02/02/14  Yes Larey Dresser, MD  glucose blood (ONE TOUCH ULTRA TEST) test strip Use as directed twice daily dx 250.01 03/02/14   Renato Shin, MD  Insulin Pen Needle (BD PEN NEEDLE NANO U/F) 32G X 4 MM MISC Use as directed four times daily 02/15/12   Renato Shin, MD    Current Facility-Administered Medications    Medication Dose Route Frequency Provider Last Rate Last Dose  . 0.9 %  sodium chloride infusion   Intravenous Continuous Modena Jansky, MD      . acetaminophen (TYLENOL) tablet 650 mg  650 mg Oral Q6H PRN Allyne Gee, MD       Or  . acetaminophen (TYLENOL) suppository 650 mg  650 mg Rectal Q6H PRN Allyne Gee, MD      . alum & mag hydroxide-simeth (MAALOX/MYLANTA) 200-200-20 MG/5ML suspension 30 mL  30 mL Oral Q6H PRN Allyne Gee, MD      . aspirin EC tablet 81 mg  81 mg Oral Daily Modena Jansky, MD   81 mg at 04/26/14 1000  . atorvastatin (LIPITOR) tablet 80 mg  80 mg Oral QHS Saadat  Richardson Dopp, MD   80 mg at 04/25/14 2239  . bisacodyl (DULCOLAX) suppository 10 mg  10 mg Rectal Daily PRN Allyne Gee, MD      . carvedilol (COREG) tablet 6.25 mg  6.25 mg Oral BID WC Modena Jansky, MD   6.25 mg at 04/26/14 1000  . citalopram (CELEXA) tablet 10 mg  10 mg Oral Daily Allyne Gee, MD   10 mg at 04/26/14 1000  . clopidogrel (PLAVIX) tablet 75 mg  75 mg Oral Daily Allyne Gee, MD   75 mg at 04/26/14 1001  . docusate sodium (COLACE) capsule 100 mg  100 mg Oral BID Allyne Gee, MD   100 mg at 04/26/14 1000  . folic acid (FOLVITE) tablet 1 mg  1 mg Oral Daily Allyne Gee, MD   1 mg at 04/26/14 1000  . heparin injection 5,000 Units  5,000 Units Subcutaneous 3 times per day Allyne Gee, MD   5,000 Units at 04/26/14 0612  . insulin aspart (novoLOG) injection 0-9 Units  0-9 Units Subcutaneous TID WC Modena Jansky, MD   2 Units at 04/26/14 1001  . insulin glargine (LANTUS) injection 10 Units  10 Units Subcutaneous Daily Modena Jansky, MD   10 Units at 04/26/14 1001  . multivitamin with minerals tablet 1 tablet  1 tablet Oral Daily Allyne Gee, MD   1 tablet at 04/26/14 1001  . nitroGLYCERIN (NITROSTAT) SL tablet 0.4 mg  0.4 mg Sublingual Q5 min PRN Allyne Gee, MD      . ondansetron [facility]) tablet 4 mg  4 mg Oral Q6H PRN Allyne Gee, MD       Or  . ondansetron (ZOFRAN)  injection 4 mg  4 mg Intravenous Q6H PRN Allyne Gee, MD   4 mg at 04/26/14 1104  . oxyCODONE (Oxy IR/ROXICODONE) immediate release tablet 5 mg  5 mg Oral Q4H PRN Allyne Gee, MD   5 mg at 04/26/14 1154  . sodium chloride (OCEAN) 0.65 % nasal spray 1 spray  1 spray Each Nare PRN Allyne Gee, MD      . sodium chloride 0.9 % injection 3 mL  3 mL Intravenous Q12H Allyne Gee, MD      . thiamine (VITAMIN B-1) tablet 100 mg  100 mg Oral Daily Allyne Gee, MD   100 mg at 04/26/14 1000    Allergies as of 04/24/2014 - Review Complete 04/24/2014  Allergen Reaction Noted  . Ramipril Other (See Comments) 12/02/2013    History reviewed. No pertinent family history.  History   Social History  . Marital Status: Married    Spouse Name: N/A    Number of Children: 4  . Years of Education: N/A   Occupational History  . PASTOR    Social History Main Topics  . Smoking status: Former Smoker -- 2 years    Types: Cigarettes    Quit date: 11/27/1994  . Smokeless tobacco: Never Used  . Alcohol Use: No  . Drug Use: No  . Sexual Activity: Not on file   Other Topics Concern  . Not on file   Social History Narrative   [employer]   MARRIED   4 BOYS   FORMER SMOKER, QUIT 14 YRS   ALCOHOL USE -NO   NO ILLICIT DRUG USE         ICD-[employer] [facility]      PATIENT SIGNED A DESIGNATED PARTY RELEASE  TO ALLOW HIS WIFE, GLENDA Wakeman, TO HAVE ACCESS TO HIS MEDICAL RECORDS/INFORMATION.    [name], March 21, 2010 9:42 AM    Review of Systems: Ten point ROS is O/W negative except as mentioned in HPI.  Physical Exam: Vital signs in last 24 hours: Temp:  [97.8 F (36.6 C)-98.3 F (36.8 C)] 98 F (36.7 C) (05/31 0556) Pulse Rate:  [85-91] 86 (05/31 0556) Resp:  [20-24] 24 (05/31 0556) BP: (93-107)/(44-62) 103/54 mmHg (05/30 2043) SpO2:  [99 %-100 %] 100 % (05/30 2043) Last BM Date: 04/25/14 General:  Alert, pleasant and cooperative in NAD Head:  Normocephalic and atraumatic. Eyes:   Sclera clear, no icterus.  Conjunctiva pink. Ears:  Normal auditory acuity. Mouth:  No deformity or lesions.   Lungs:  Clear throughout to auscultation.  No wheezes, crackles, or rhonchi.  Heart:  Regular rate and rhythm; no murmurs, clicks, rubs, or gallops. Abdomen:  Soft, non-distended.  BS present.  Non-tender. Rectal:  Deferred  Msk:  Symmetrical without gross deformities. Pulses:  Normal pulses noted. Extremities:  Without clubbing or edema. Neurologic:  Alert and  oriented x4;  grossly normal neurologically. Skin:  Intact without significant lesions or rashes. Psych:  Alert and cooperative. Normal mood and affect.  Intake/Output from previous day: 05/30 0701 - 05/31 0700 In: [id number] [P.O.:10740; I.V.:987] Out: 200 [Urine:200] Intake/Output this shift: Total I/O In: 240 [P.O.:240] Out: -   Lab Results:  Recent Labs  04/24/14 2001 04/25/14 0445 04/26/14 0442  WBC 11.5* 8.3 8.4  HGB 9.1* 7.8* 7.8*  HCT 26.7* 23.5* 23.2*  PLT 196 139* 141*   BMET  Recent Labs  04/24/14 2001 04/25/14 0445 04/26/14 0442  NA 124* 125* 131*  K 4.7 4.3 4.3  CL 85* 90* 96  CO2 23 20 22   GLUCOSE 167* 172* 260*  BUN 42* 35* 23  CREATININE 2.43* 1.81* 1.24  CALCIUM 9.4 8.4 8.7   LFT  Recent Labs  04/25/14 0445  PROT 5.5*  ALBUMIN 2.4*  AST 48*  ALT 14  ALKPHOS 168*  BILITOT 0.7   PT/INR  Recent Labs  04/25/14 0445  LABPROT 15.3*  INR 1.24   Studies/Results: Dg Chest 2 View  04/25/2014   CLINICAL DATA:  Follow-up pulmonary nodule  EXAM: CHEST  2 VIEW  COMPARISON:  04/24/2014  FINDINGS: Left apical rounded opacity again noted measuring approximately 3 cm. There is no focal parenchymal opacity, pleural effusion, or pneumothorax. Stable cardiomediastinal silhouette. Prior CABG. Single lead cardiac pacer again noted.  The osseous structures are unremarkable.  IMPRESSION: Left apical rounded opacity again noted measuring approximately 3 cm. This may reflect round pneumonia  versus abnormality within the chest wall soft tissues. The appearance is unlikely to reflect malignancy given the significant interval enlargement compared with 02/09/2014. Further evaluation with chest CT is recommended.   Electronically Signed   By: Kathreen Devoid   On: 04/25/2014 16:48   US Renal  04/26/2014   CLINICAL DATA:  Acute renal failure, elevated creatinine, history of esophageal cancer, diabetes and hypertension  EXAM: RENAL/URINARY TRACT ULTRASOUND COMPLETE  COMPARISON:  CT abdomen pelvis - 02/09/2014  FINDINGS: Right Kidney:  Normal cortical thickness, echogenicity and size, measuring 11.9 cm in length. No focal renal lesions. No echogenic renal stones. No urinary obstruction.  Left Kidney:  Normal cortical thickness, echogenicity and size, measuring 12.1 cm in length. No focal renal lesions. No echogenic renal stones. No urinary obstruction.  Bladder:  Appears normal for degree of bladder distention.  Other: Note is made of multiple mixed echogenic solid lesions within the incidentally imaged right lobe of the liver with dominant mass measuring approximately 4.2 x 3.2 cm (image 13) with additional lesion measuring approximately 2.4 x 2.0 cm (also seen on image 13).  IMPRESSION: 1. No explanation for patient's acute renal failure. Specifically, no evidence of urinary obstruction. 2. Interval development of multiple liver lesions within the incidentally imaged right lobe of the liver. While several smaller hyperenhancing liver lesions were noted on prior abdominal CT performed 02/09/2014, these lesions appear new and larger and given history of esophageal cancer are worrisome for metastatic disease. Further evaluation with abdominal CT scan and/or dedicated abdominal ultrasound as clinically indicated.   Electronically Signed   By: Sandi Mariscal M.D.   On: 04/26/2014 08:47   Dg Chest Port 1 View  04/24/2014   CLINICAL DATA:  73 year old male with hypotension. Pain. Initial encounter.  EXAM: PORTABLE  CHEST - 1 VIEW  COMPARISON:  03/14/2014 and earlier.  FINDINGS: Portable AP upright view at 2017 hrs. New since last month is indistinct 3 cm nodular opacity in the left lung apex. There seems to be some additional associated extra pulmonary density near the left shoulder (horizontal arrow).  Otherwise improved lung base ventilation. Lung parenchyma elsewhere appears stable with no pneumothorax or effusion. Stable cardiomegaly and mediastinal contours. Stable left chest cardiac AICD. Visualized tracheal air column is within normal limits.  IMPRESSION: 1. A left upper lobe 3 cm pulmonary nodule is new since last month, suggesting either infectious etiology or artifact. PA and lateral chest radiographs would be valuable when possible. Alternatively, chest CT (IV contrast preferred) could be utilized. 2. Improved bibasilar ventilation since April. No other new cardiopulmonary abnormality identified.   Electronically Signed   By: Lars Pinks M.D.   On: 04/24/2014 20:30    IMPRESSION:  -Known esophageal cancer s/p chemo and radiation with residual tumor seen on EGD 04/09/2014 -Dysphagia with radiation induced esophageal stricture dilated on 04/09/2014.  Current symptoms do not reflect dysphagia as he was having pre-procedure.   -Acute on chronic anemia:  Likely secondary to his malignancy and possibly slow GI bleed from friable malignant tissue in the esophagus -Multiple other medical problems per primary service.   PLAN: -Plan per Dr. Henrene Pastor.  Will start with esophagram.   Laban Emperor. Zehr  04/26/2014, 12:48 PM  Pager number [phone]  GI ATTENDING  History, laboratories, x-rays, recent endoscopy report reviewed. Patient personally seen and examined. Admitted with multiple issues including failure to thrive, hyponatremia, profound weakness and fatigue. Underwent upper endoscopy 2 weeks ago. Was found to have radiation stricture of the esophagus as well as residual esophageal cancer. He was dilated with a  balloon dilator. Symptoms now are a bit confusing. He reports gagging or regurgitating liquids. At this point I recommend correcting his electrolytes and adequate hydration. We will obtain a barium esophagram in the morning to eliminate his anatomy and rule out high-grade obstructing lesion. Overall poor prognosis  John N. Geri Seminole., M.D. [facility] Division of Gastroenterology

## 2014-04-26 NOTE — Progress Notes (Signed)
INITIAL NUTRITION ASSESSMENT  DOCUMENTATION CODES Per approved criteria  -Severe malnutrition in the context of chronic illness   INTERVENTION:  Whole milk with each meal  Sugar free pudding bid  Magic cup once daily  Encouraged intake and need to eat and drink throughout the day.    Decrease diet to dysphagia 3 consistency with CHO restriction.  RD to follow.  NUTRITION DIAGNOSIS: Inadequate oral intake related to poor appetite, difficulty swallowing, as evidenced by patient report, observation, .   Goal: Intake of meals and supplements to meet >90% estimated needs.    Monitor:  Intake, labs, diet tolerance  Reason for Assessment: MST  73 y.o. male  Admitting Dx: Hyponatremia  ASSESSMENT: Patient admitted with dehydration.  Hx includes: CAD, status post CABG, ischemic cardiomyopathy, esophageal cancer (high grade neuroendocrine esophageal cancer) status post chemotherapy and radiation, last echo 2/15 showed EF 25-30%, developed recurrent dysphagia related to radiation-related esophageal stricture, status post dilatation, presented to the ED on 04/24/14 with complaints of generalized weakness, difficulty ambulating, decreased oral intake, continues to take home medications including antihypertensives and diuretics and approximately one month history of diffuse back pain. In the ED, patient was noted to hypotensive, sodium 124, creatinine 2.43, hemoglobin 9.1 and chest x-ray showing new left upper lobe pulmonary nodule.  Patient also with uncontrolled type 1 DM.  Spoke with patient and daughter.  Very poor intake over the past 4-5 weeks secondary to increased back pain, difficulty swallowing, and decreased appetite.  Patient states trying supplements in the past but does not like them and finds them too sweet.  Patient had been followed closely by the [facility] RD last year.  Patient meets criteria for severe malnutrition related to chronic illness AEB weight loss  of 10% in the past month, intake <75% for > 1 month and decreased body fat and muscle mass.  Nutrition Focused Physical Exam:  Subcutaneous Fat:  Orbital Region: mild/moderate Upper Arm Region: mild Thoracic and Lumbar Region: n/a  Muscle:  Temple Region: mild/moderate Clavicle Bone Region: mild Clavicle and Acromion Bone Region: wnl Scapular Bone Region: n/a Dorsal Hand: mild Patellar Region: n/a Anterior Thigh Region: n/a Posterior Calf Region: severe  Edema: not noted      Height: Ht Readings from Last 1 Encounters:  04/24/14 5\' 7"  (1.702 m)    Weight: Wt Readings from Last 1 Encounters:  04/24/14 151 lb (68.493 kg)    Ideal Body Weight: 148 lbs  % Ideal Body Weight: 102  Wt Readings from Last 10 Encounters:  04/24/14 151 lb (68.493 kg)  04/17/14 151 lb (68.493 kg)  04/03/14 159 lb (72.122 kg)  03/27/14 165 lb (74.844 kg)  03/16/14 167 lb (75.751 kg)  03/11/14 168 lb 12.8 oz (76.567 kg)  03/04/14 167 lb 12.8 oz (76.114 kg)  02/12/14 171 lb 1.6 oz (77.61 kg)  01/26/14 168 lb 6.4 oz (76.386 kg)  01/15/14 169 lb 1.9 oz (76.712 kg)    Usual Body Weight: 168 lbs in April  % Usual Body Weight: 90  BMI:  Body mass index is 23.64 kg/(m^2).  Estimated Nutritional Needs: Kcal: 1900-2100 Protein: 85-95 gm Fluid: 1.9L daily  Skin: intact  Diet Order: Carb Control  EDUCATION NEEDS: -Education needs addressed   Intake/Output Summary (Last 24 hours) at 04/26/14 1035 Last data filed at 04/26/14 0938  Gross per 24 hour  Intake  11967 ml  Output    200 ml  Net  11767 ml     Labs:  Recent Labs Lab 04/24/14 2001 04/25/14 0445 04/26/14 0442  NA 124* 125* 131*  K 4.7 4.3 4.3  CL 85* 90* 96  CO2 23 20 22   BUN 42* 35* 23  CREATININE 2.43* 1.81* 1.24  CALCIUM 9.4 8.4 8.7  GLUCOSE 167* 172* 260*    CBG (last 3)   Recent Labs  04/25/14 1739 04/25/14 2059 04/26/14 0734  GLUCAP 261* 285* 245*    Scheduled Meds: . aspirin EC  81 mg Oral  Daily  . atorvastatin  80 mg Oral QHS  . carvedilol  6.25 mg Oral BID WC  . citalopram  10 mg Oral Daily  . clopidogrel  75 mg Oral Daily  . docusate sodium  100 mg Oral BID  . folic acid  1 mg Oral Daily  . heparin  5,000 Units Subcutaneous 3 times per day  . insulin aspart  0-9 Units Subcutaneous TID WC  . insulin glargine  10 Units Subcutaneous Daily  . multivitamin with minerals  1 tablet Oral Daily  . sodium chloride  3 mL Intravenous Q12H  . thiamine  100 mg Oral Daily    Continuous Infusions:   Past Medical History  Diagnosis Date  . Dyslipidemia   . Hypertension   . History of colonic polyps   . Thrombocytopenia   . Nonproliferative diabetic retinopathy NOS(362.03)   . Impotence of organic origin   . Iron deficiency anemia, unspecified   . Unspecified hereditary and idiopathic peripheral neuropathy   . Diabetes mellitus   . Depressive disorder, not elsewhere classified   . Coronary artery disease     a. s/p CABG 1997; b. LHC 04/2007: Distal left main 80% treated with PCI (Cypher DES), LIMA-LAD patent, SVG-OM patent, SVG-PDA patent, EF 30% with inferior HK  . Chronic systolic heart failure     a. Echo 09/2011: EF 30-35%, apical AK, basal inferior AK, mild AI, moderate MR, moderate LAE  . Anxiety state, unspecified   . Ischemic cardiomyopathy   . S/P ICD (internal cardiac defibrillator) procedure   . Esophageal cancer   . Elevated PSA   . History of radiation therapy 08/06/13-09/12/13    esophageal  . Esophageal stricture     Past Surgical History  Procedure Laterality Date  . Coronary artery bypass graft      X 3  . Cardiac catheterization  05/23/07  . Insert / replace / remove pacemaker  07/05/07    [employer] SINGLE-CHAMBER DEFIBRILLATOR, Cristopher Peru, MD  . Esophagogastroduodenoscopy N/A 07/08/2013    Procedure: ESOPHAGOGASTRODUODENOSCOPY (EGD) with possible Dilation.;  Surgeon: Inda Castle, MD;  Location: [facility];  Service: Endoscopy;  Laterality: N/A;   . Esophagogastroduodenoscopy N/A 04/09/2014    Procedure: ESOPHAGOGASTRODUODENOSCOPY (EGD);  Surgeon: Inda Castle, MD;  Location: [facility];  Service: Endoscopy;  Laterality: N/A;  Possible Dil  . Balloon dilation N/A 04/09/2014    Procedure: BALLOON DILATION;  Surgeon: Inda Castle, MD;  Location: [facility];  Service: Endoscopy;  Laterality: N/A;  . Esophageal dilation      Antonieta Iba, RD, LDN Clinical Inpatient Dietitian Pager:  [phone] Weekend and after hours pager:  [phone]

## 2014-04-26 NOTE — Progress Notes (Signed)
CARE MANAGEMENT NOTE 04/26/2014  Patient:  KHYLE, GOODELL   Account Number:  [id number]  Date Initiated:  04/26/2014  Documentation initiated by:  [facility]  Subjective/Objective Assessment:   Dysphagia, esophageal cancer     Action/Plan:   Anticipated DC Date:  04/28/2014   Anticipated DC Plan:  [place]  CM consult      [facility] Choice  HOME HEALTH   Choice offered to / List presented to:  C-4 Adult Children           Status of service:   Medicare Important Message given?  YES (If response is "NO", the following Medicare IM given date fields will be blank) Date Medicare IM given:  04/24/2014 Date Additional Medicare IM given:    Discharge Disposition:    Per UR Regulation:    If discussed at Long Length of Stay Meetings, dates discussed:    Comments:  04/25/2014 1700 NCM spoke to dtr-in-law, [name]. Requesting [facility] for [facility]. Waiting for final [place] orders for home. [name] RN CCM Case Mgmt phone [phone]

## 2014-04-26 NOTE — Evaluation (Signed)
Physical Therapy Evaluation Patient Details Name: Billy Bush MRN: [id number] DOB: [birth year] Today's Date: 04/26/2014   History of Present Illness  73 yo male admitted with dehydration, hyponatremia, hypotension, weakness. Hx of HTN, DM, peripheral neuropathy, anxiety, esophageal cancer, CABG.   Clinical Impression  On eval, pt required Min assist for mobility-able to ambulate ~500 feet with use of RW. Demonstrates general weakness and impaired gait and balance. Recommend HHPt, 24/7 supervision/assist at discharge.     Follow Up Recommendations Home health PT;Supervision/Assistance - 24 hour    Equipment Recommendations  Rolling walker with 5" wheels (possibly)    Recommendations for Other Services OT consult     Precautions / Restrictions Precautions Precautions: Fall Restrictions Weight Bearing Restrictions: No      Mobility  Bed Mobility Overal bed mobility: Needs Assistance Bed Mobility: Supine to Sit     Supine to sit: Min guard     General bed mobility comments: close guard for safety  Transfers Overall transfer level: Needs assistance Equipment used: Rolling walker (2 wheeled) Transfers: Sit to/from Stand Sit to Stand: From elevated surface;Min assist         General transfer comment: assist to rise, stabilize, control descent. VCs safety, hand placement  Ambulation/Gait Ambulation/Gait assistance: Min assist Ambulation Distance (Feet): 500 Feet Assistive device: Rolling walker (2 wheeled) Gait Pattern/deviations: Decreased stride length     General Gait Details: slow gait speed. assist to stabilize throughout ambulation. a few brief standing rest breaks needed.   Stairs            Wheelchair Mobility    Modified Rankin (Stroke Patients Only)       Balance Overall balance assessment: History of Falls;Needs assistance         Standing balance support: Bilateral upper extremity supported;During functional activity Standing  balance-Leahy Scale: Poor                               Pertinent Vitals/Pain Back  5/10 with activity.     Home Living Family/patient expects to be discharged to:: Private residence Living Arrangements: Spouse/significant other (uses rollator) Available Help at Discharge: Family Type of Home: House Home Access: Ramped entrance     [place]: [place]: None      Prior Function Level of Independence: Independent               Hand Dominance        Extremity/Trunk Assessment   Upper Extremity Assessment: Generalized weakness           Lower Extremity Assessment: Generalized weakness      Cervical / Trunk Assessment: Kyphotic  Communication   Communication: No difficulties  Cognition Arousal/Alertness: Awake/alert Behavior During Therapy: WFL for tasks assessed/performed Overall Cognitive Status: Within Functional Limits for tasks assessed                      General Comments      Exercises        Assessment/Plan    PT Assessment Patient needs continued PT services  PT Diagnosis Difficulty walking;Generalized weakness   PT Problem List Decreased strength;Decreased activity tolerance;Decreased balance;Decreased mobility;Decreased knowledge of use of DME;Pain  PT Treatment Interventions DME instruction;Gait training;Functional mobility training;Therapeutic activities;Therapeutic exercise;Patient/family education;Balance training   PT Goals (Current goals can be found in the Care Plan section) Acute Rehab PT Goals Patient Stated Goal: get stronger. home.  PT Goal  Formulation: With patient/family Time For Goal Achievement: 05/10/14 Potential to Achieve Goals: Good    Frequency Min 3X/week   Barriers to discharge        Co-evaluation               End of Session Equipment Utilized During Treatment: Gait belt Activity Tolerance: Patient tolerated treatment well Patient left: in chair;with call  bell/phone within reach;with chair alarm set;with family/visitor present           Time: 5397-6734 PT Time Calculation (min): 23 min   Charges:   PT Evaluation $Initial PT Evaluation Tier I: 1 Procedure PT Treatments $Gait Training: 23-37 mins   PT G Codes:          Weston Anna, MPT Pager: [phone]

## 2014-04-26 NOTE — Progress Notes (Signed)
Went over heart failure zone tool with patient this AM. Patient demonstrated understanding and had no further questions.

## 2014-04-26 NOTE — Progress Notes (Signed)
Patient's HS CBG was 285 tonight. No coverage ordered, M. Donnal Debar, NP made aware. Holding coverage d/t NPO status for abdominal ultrasound tomorrow. Patient and family made aware. Will continue to monitor patient.

## 2014-04-27 ENCOUNTER — Telehealth: Payer: Self-pay | Admitting: Oncology

## 2014-04-27 ENCOUNTER — Inpatient Hospital Stay (HOSPITAL_COMMUNITY): Payer: Medicare HMO

## 2014-04-27 ENCOUNTER — Encounter (HOSPITAL_COMMUNITY): Payer: Self-pay | Admitting: Radiology

## 2014-04-27 ENCOUNTER — Ambulatory Visit: Payer: Medicare HMO | Admitting: Nurse Practitioner

## 2014-04-27 ENCOUNTER — Ambulatory Visit: Payer: Medicare HMO

## 2014-04-27 DIAGNOSIS — D696 Thrombocytopenia, unspecified: Secondary | ICD-10-CM

## 2014-04-27 DIAGNOSIS — C7A1 Malignant poorly differentiated neuroendocrine tumors: Secondary | ICD-10-CM

## 2014-04-27 DIAGNOSIS — C159 Malignant neoplasm of esophagus, unspecified: Principal | ICD-10-CM

## 2014-04-27 DIAGNOSIS — D649 Anemia, unspecified: Secondary | ICD-10-CM

## 2014-04-27 DIAGNOSIS — I951 Orthostatic hypotension: Secondary | ICD-10-CM

## 2014-04-27 DIAGNOSIS — K769 Liver disease, unspecified: Secondary | ICD-10-CM

## 2014-04-27 LAB — BASIC METABOLIC PANEL
BUN: 14 mg/dL (ref 6–23)
CALCIUM: 8.9 mg/dL (ref 8.4–10.5)
CO2: 22 meq/L (ref 19–32)
CREATININE: 1.01 mg/dL (ref 0.50–1.35)
Chloride: 98 mEq/L (ref 96–112)
GFR calc non Af Amer: 73 mL/min — ABNORMAL LOW (ref >90)
GFR, EST AFRICAN AMERICAN: 85 mL/min — AB (ref >90)
Glucose, Bld: 170 mg/dL — ABNORMAL HIGH (ref 70–99)
Potassium: 3.7 mEq/L (ref 3.7–5.3)
Sodium: 132 mEq/L — ABNORMAL LOW (ref 137–147)

## 2014-04-27 LAB — GLUCOSE, CAPILLARY
GLUCOSE-CAPILLARY: 173 mg/dL — AB (ref 70–99)
GLUCOSE-CAPILLARY: 161 mg/dL — AB (ref 70–99)
GLUCOSE-CAPILLARY: 167 mg/dL — AB (ref 70–99)
Glucose-Capillary: 185 mg/dL — ABNORMAL HIGH (ref 70–99)
Glucose-Capillary: 162 mg/dL — ABNORMAL HIGH (ref 70–99)

## 2014-04-27 LAB — CBC
HEMATOCRIT: 22.2 % — AB (ref 39.0–52.0)
Hemoglobin: 7.5 g/dL — ABNORMAL LOW (ref 13.0–17.0)
MCH: 30.7 pg (ref 26.0–34.0)
MCHC: 33.8 g/dL (ref 30.0–36.0)
MCV: 91 fL (ref 78.0–100.0)
Platelets: 129 10*3/uL — ABNORMAL LOW (ref 150–400)
RBC: 2.44 MIL/uL — ABNORMAL LOW (ref 4.22–5.81)
RDW: 14 % (ref 11.5–15.5)
WBC: 8.1 10*3/uL (ref 4.0–10.5)

## 2014-04-27 LAB — PREPARE RBC (CROSSMATCH)

## 2014-04-27 MED ORDER — MORPHINE SULFATE ER 15 MG PO TBCR
15.0000 mg | EXTENDED_RELEASE_TABLET | Freq: Two times a day (BID) | ORAL | Status: DC
  Administered 2014-04-27 – 2014-05-01 (×8): 15 mg via ORAL
  Filled 2014-04-27 (×8): qty 1

## 2014-04-27 MED ORDER — OXYCODONE HCL 5 MG PO TABS
5.0000 mg | ORAL_TABLET | ORAL | Status: DC | PRN
  Administered 2014-04-27: 10 mg via ORAL
  Administered 2014-04-27: 5 mg via ORAL
  Administered 2014-04-28 (×2): 10 mg via ORAL
  Administered 2014-04-28: 5 mg via ORAL
  Administered 2014-04-28 – 2014-04-30 (×7): 10 mg via ORAL
  Administered 2014-04-30: 5 mg via ORAL
  Administered 2014-04-30 – 2014-05-01 (×6): 10 mg via ORAL
  Filled 2014-04-27 (×19): qty 2

## 2014-04-27 MED ORDER — FUROSEMIDE 10 MG/ML IJ SOLN
20.0000 mg | Freq: Once | INTRAMUSCULAR | Status: AC
  Administered 2014-04-27: 20 mg via INTRAVENOUS
  Filled 2014-04-27: qty 2

## 2014-04-27 MED ORDER — INSULIN ASPART 100 UNIT/ML ~~LOC~~ SOLN
0.0000 [IU] | Freq: Three times a day (TID) | SUBCUTANEOUS | Status: DC
  Administered 2014-04-27 – 2014-04-28 (×3): 2 [IU] via SUBCUTANEOUS
  Administered 2014-04-28 – 2014-04-29 (×4): 1 [IU] via SUBCUTANEOUS
  Administered 2014-04-30 – 2014-05-01 (×4): 2 [IU] via SUBCUTANEOUS

## 2014-04-27 MED ORDER — CLOPIDOGREL BISULFATE 75 MG PO TABS
75.0000 mg | ORAL_TABLET | Freq: Every day | ORAL | Status: DC
  Administered 2014-04-27 – 2014-04-28 (×2): 75 mg via ORAL
  Filled 2014-04-27 (×2): qty 1

## 2014-04-27 MED ORDER — INSULIN ASPART 100 UNIT/ML ~~LOC~~ SOLN: 0.0000 [IU] | Freq: Every day | SUBCUTANEOUS | Status: DC

## 2014-04-27 NOTE — Progress Notes (Signed)
04/27/14 0535  Orthostatic Lying   BP- Lying 110/59 mmHg  Pulse- Lying 104  Orthostatic Sitting  BP- Sitting 107/50 mmHg  Pulse- Sitting 100  Orthostatic Standing at 0 minutes  BP- Standing at 0 minutes ! 88/48 mmHg  Pulse- Standing at 0 minutes 102  Orthostatic Standing at 3 minutes  BP- Standing at 3 minutes ! 75/57 mmHg  Pulse- Standing at 3 minutes 99   Patient noted to be orthostatic this morning. Patient assisted back to bed and told to call if he needs assistance. Bed alarm on and family member at bedside. Will continue to monitor.

## 2014-04-27 NOTE — Telephone Encounter (Signed)
, °

## 2014-04-27 NOTE — Progress Notes (Signed)
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 In the ED, patient was noted to hypotensive, sodium 124, creatinine 2.43, hemoglobin 9.1 and chest x-ray showing new left upper lobe pulmonary nodule.   Assessment/Plan:  1. Dehydration: Secondary to poor oral intake and diuretics. Patient having difficulty with by mouth intake secondary to dysphagia. Resolved after IV fluids. At risk for recurrent dehydration from poor oral intake related to dysphagia. 2. Acute renal failure: Creatinine in March was 1 and on 5/22 was 1.5. Likely secondary to dehydration, hypotension and ACEI/ARB. Improved. No hydronephrosis on renal ultrasound. Resolved after IV fluid. 3. Hyponatremia: May be secondary to dehydration versus SIADH. Stable. Asymptomatic. Improved. 4. Hypotension/orthostatic hypotension: Reduced dose of carvedilol. Clinically euvolemic or even slightly hypervolemic post IV hydration but continues to be orthostatic. Bilateral lower extremity TED hose. Monitor. PRBC transfusion may help. 5. Anemia: Likely secondary to chronic disease and malignancy. No reported bleeding. Follow CBCs and transfuse hemoglobin less than 7 g per DL. Stable . Will transfuse 2 units of PRBCs to help orthostatic hypotension as well. 6. Thrombocytopenia: Follow daily CBCs.  Stable 7. History of CAD/CABG & DES 2 left main/ischemic cardiomyopathy LVEF 25-30%/[employer]. Jude ICD: Recently saw cardiology on 5/23-continue Plavix, aspirin, statins. Reduced dose of carvedilol secondary to hypotension. Temporarily hold ramipril secondary to hypotension and acute on chronic kidney disease. 8. History of chronic systolic CHF: Apparently runs soft blood pressures. Hold Lasix, spironolactone, ACEI/ARB temporarily. Monitor closely. Euvolemic or starting to get slightly hypervolemic. Monitor closely 9. History of hyperlipidemia: Continue statins 10. Back pain: Ongoing for last 1 month. States that it's better controlled after his oncologist started him on some new medications. We'll defer evaluation to medical oncology-patient has appointment to see Dr. Benay Spice on 6/1. No features to suggest UTI-we'll discontinue Rocephin 11. Esophageal cancer/dysphagia: Management per oncology. New lung nodule on chest x-ray and possible liver metastases on renal ultrasound today. Will discuss with Dr. Benay Spice on 6/1 regarding further evaluation-? CT chest abdomen and pelvis. Patient has no symptoms to suggest pneumonia at this time. Continued dysphagia. He is status post EGD and dilatation of radiation related stricture on 5/14. GI consultation and followup appreciated-barium esophagram confirms smooth distal esophageal stricture-likely malignant. Awaiting oncology input regarding additional chemotherapy and GI followup regarding further dilatation of stricture versus stent placement. 12. Uncontrolled DM 1 with renal complications: SSI. Added Lantus 10 units subcutaneous daily.  Code Status: Full Family Communication: Discussed with patient's son at bedside. Disposition Plan: Home when medically stable.   Consultants:  Gastroenterology  Procedures:  None  Antibiotics:  IV Rocephin 5/29-DC'd   Subjective: Back pain controlled. States that dysphagia is the same. No dizziness or  lightheadedness.  Objective: Filed Vitals:   04/26/14 0556 04/26/14 1502 04/26/14 2106 04/27/14 0535  BP:  110/60 98/50 132/86  Pulse: 86 89 82 86  Temp: 98 F (36.7 C) 97.9 F (36.6 C) 98.4 F (36.9 C) 98.3 F (36.8 C)  TempSrc: Oral Oral Oral Oral  Resp: 24 20  20 20  Height:      Weight:      SpO2:  100% 100% 100%    Intake/Output Summary (Last 24 hours) at 04/27/14 1429 Last data filed at 04/27/14 1212  Gross per 24 hour  Intake   1100 ml  Output   1150 ml  Net    -50 ml   Filed Weights   04/24/14 1946  Weight: 68.493 kg (151 lb)     Exam:  General exam: Pleasant elderly frail male sitting up on chair. Respiratory system: Distant breath sounds but clear to auscultation. No increased work of breathing. Cardiovascular system: S1 & S2 heard, RRR. No JVD, murmurs, gallops, clicks or pedal edema.  Gastrointestinal system: Abdomen is nondistended, soft and nontender. Normal bowel sounds heard. Central nervous system: Alert and oriented. No focal neurological deficits. Extremities: Symmetric 5 x 5 power.   Data Reviewed: Basic Metabolic Panel:  Recent Labs Lab 04/24/14 2001 04/25/14 0445 04/26/14 0442 04/27/14 0432  NA 124* 125* 131* 132*  K 4.7 4.3 4.3 3.7  CL 85* 90* 96 98  CO2 23 20 22 22   GLUCOSE 167* 172* 260* 170*  BUN 42* 35* 23 14  CREATININE 2.43* 1.81* 1.24 1.01  CALCIUM 9.4 8.4 8.7 8.9   Liver Function Tests:  Recent Labs Lab 04/24/14 2001 04/25/14 0445  AST 60* 48*  ALT 18 14  ALKPHOS 209* 168*  BILITOT 0.9 0.7  PROT 6.8 5.5*  ALBUMIN 3.1* 2.4*   No results found for this basename: LIPASE, AMYLASE,  in the last 168 hours No results found for this basename: AMMONIA,  in the last 168 hours CBC:  Recent Labs Lab 04/24/14 2001 04/25/14 0445 04/26/14 0442 04/27/14 0432  WBC 11.5* 8.3 8.4 8.1  HGB 9.1* 7.8* 7.8* 7.5*  HCT 26.7* 23.5* 23.2* 22.2*  MCV 87.8 89.7 90.6 91.0  PLT 196 139* 141* 129*   Cardiac Enzymes: No results  found for this basename: CKTOTAL, CKMB, CKMBINDEX, TROPONINI,  in the last 168 hours BNP (last 3 results)  Recent Labs  01/05/14 0814 01/15/14 1541 04/17/14 0847  PROBNP 390.0* 292.0* 456.0*   CBG:  Recent Labs Lab 04/26/14 1235 04/26/14 1659 04/26/14 2103 04/27/14 0724 04/27/14 1144  GLUCAP 232* 218* 185* 173* 161*    Recent Results (from the past 240 hour(s))  CULTURE, BLOOD (ROUTINE X 2)     Status: None   Collection Time    04/24/14  8:05 PM      Result Value Ref Range Status   Specimen Description BLOOD LEFT ARM   Final   Special Requests BOTTLES DRAWN AEROBIC AND ANAEROBIC 5CC   Final   Culture  Setup Time     Final   Value: 04/25/2014 00:45     Performed at [facility]   Culture     Final   Value:        BLOOD CULTURE RECEIVED NO GROWTH TO DATE CULTURE WILL BE HELD FOR 5 DAYS BEFORE ISSUING A FINAL NEGATIVE REPORT     Performed at [facility]   Report Status PENDING   Incomplete  CULTURE, BLOOD (ROUTINE X 2)     Status: None   Collection Time    04/24/14 10:26 PM      Result Value Ref Range Status   Specimen Description BLOOD RIGHT ARM   Final   Special Requests BOTTLES DRAWN AEROBIC AND ANAEROBIC 5CC   Final   Culture  Setup Time  Final   Value: 04/25/2014 00:45     Performed at [facility]   Culture     Final   Value:        BLOOD CULTURE RECEIVED NO GROWTH TO DATE CULTURE WILL BE HELD FOR 5 DAYS BEFORE ISSUING A FINAL NEGATIVE REPORT     Performed at [facility]   Report Status PENDING   Incomplete          Studies: Dg Chest 2 View  04/25/2014   CLINICAL DATA:  Follow-up pulmonary nodule  EXAM: CHEST  2 VIEW  COMPARISON:  04/24/2014  FINDINGS: Left apical rounded opacity again noted measuring approximately 3 cm. There is no focal parenchymal opacity, pleural effusion, or pneumothorax. Stable cardiomediastinal silhouette. Prior CABG. Single lead cardiac pacer again noted.  The osseous structures are  unremarkable.  IMPRESSION: Left apical rounded opacity again noted measuring approximately 3 cm. This may reflect round pneumonia versus abnormality within the chest wall soft tissues. The appearance is unlikely to reflect malignancy given the significant interval enlargement compared with 02/09/2014. Further evaluation with chest CT is recommended.   Electronically Signed   By: Kathreen Devoid   On: 04/25/2014 16:48   Dg Esophagus  04/27/2014   CLINICAL DATA:  Esophageal cancer.  Radiation therapy .  EXAM: ESOPHOGRAM/BARIUM SWALLOW  TECHNIQUE: Single contrast examination was performed using  thin barium .  FLUOROSCOPY TIME:  0 min 39 seconds.  COMPARISON:  CT 03/16/2014.  FINDINGS: This a slightly limited exam due to patient's condition. The upper and mid thoracic esophagus were widely patent. Narrowing of the distal esophagus is present. This appeared relatively fixed. Differential diagnosis includes of malignancy and and or scarring. Small sliding hiatal hernia is present.  IMPRESSION: Moderate fixed narrowing of the distal esophagus consistent with recurrent tumor and/or scarring.   Electronically Signed   By: Marcello Moores  Register   On: 04/27/2014 12:07   US Renal  04/26/2014   CLINICAL DATA:  Acute renal failure, elevated creatinine, history of esophageal cancer, diabetes and hypertension  EXAM: RENAL/URINARY TRACT ULTRASOUND COMPLETE  COMPARISON:  CT abdomen pelvis - 02/09/2014  FINDINGS: Right Kidney:  Normal cortical thickness, echogenicity and size, measuring 11.9 cm in length. No focal renal lesions. No echogenic renal stones. No urinary obstruction.  Left Kidney:  Normal cortical thickness, echogenicity and size, measuring 12.1 cm in length. No focal renal lesions. No echogenic renal stones. No urinary obstruction.  Bladder:  Appears normal for degree of bladder distention.  Other: Note is made of multiple mixed echogenic solid lesions within the incidentally imaged right lobe of the liver with dominant  mass measuring approximately 4.2 x 3.2 cm (image 13) with additional lesion measuring approximately 2.4 x 2.0 cm (also seen on image 13).  IMPRESSION: 1. No explanation for patient's acute renal failure. Specifically, no evidence of urinary obstruction. 2. Interval development of multiple liver lesions within the incidentally imaged right lobe of the liver. While several smaller hyperenhancing liver lesions were noted on prior abdominal CT performed 02/09/2014, these lesions appear new and larger and given history of esophageal cancer are worrisome for metastatic disease. Further evaluation with abdominal CT scan and/or dedicated abdominal ultrasound as clinically indicated.   Electronically Signed   By: Sandi Mariscal M.D.   On: 04/26/2014 08:47        Scheduled Meds: . aspirin EC  81 mg Oral Daily  . atorvastatin  80 mg Oral QHS  . carvedilol  6.25 mg Oral BID WC  .  citalopram  10 mg Oral Daily  . clopidogrel  75 mg Oral Daily  . docusate sodium  100 mg Oral BID  . folic acid  1 mg Oral Daily  . heparin  5,000 Units Subcutaneous 3 times per day  . insulin aspart  0-9 Units Subcutaneous TID WC  . insulin glargine  10 Units Subcutaneous Daily  . multivitamin with minerals  1 tablet Oral Daily  . sodium chloride  3 mL Intravenous Q12H  . thiamine  100 mg Oral Daily   Continuous Infusions:    Principal Problem:   Hyponatremia Active Problems:   DIABETES MELLITUS, TYPE I   Other and unspecified hyperlipidemia   ANEMIA, IRON DEFICIENCY   THROMBOCYTOPENIA   HYPERTENSION   CORONARY ARTERY DISEASE   SYSTOLIC HEART FAILURE, CHRONIC   Hypotension   Dehydration   Acute renal failure   Protein-calorie malnutrition, severe    Time spent: 40 minutes    Modena Jansky, MD, FACP, [facility]. [facility] Pager [phone]  If 7PM-7AM, please contact night-coverage [email or web address] Password [email or web address] 04/27/2014, 2:29 PM    LOS: 3 days

## 2014-04-27 NOTE — Progress Notes (Signed)
PT Cancellation Note  Patient Details Name: Billy Bush MRN: [id number] DOB: Jan 21, 1941   Cancelled Treatment:    Reason Eval/Treat Not Completed: Fatigue/lethargy limiting ability to participate--pt declined to participate at this time. Will check back another day. Thanks.    Weston Anna, MPT Pager: [phone]

## 2014-04-27 NOTE — Progress Notes (Addendum)
[facility]  [phone]   Requesting Provider: [facility]  Consulting Provider: Izola Price. Leonna Schlee  Primary Oncologist: Izola Price. [place]  NOTE  Reason for Consultation: Esophageal Cancer  HPI:  Billy Bush is a 73 year old male with a history of esophageal cancer as described below, until recently was in clinical remission.  He was admitted to [facility] after presenting on 5/29 with generalized weakness, poor oral intake and weight loss. Symptoms were exacerbated by oral antihypertensive meds and diuretics with subsequent orthostatic hypotension. He had no syncopal episode.  He denied any respiratory complaints or chest pain. No confusion. These symptoms had followed a recent esophageal dilatation for stricture secondary to recent radiation (5/14).  Labs were remarkable for hyponatremia, acute renal failure and anemia without blood loss. He was given IV hydration with some improvement of symptoms.  CXR was suspicious for pneumonia at the left apex. Renal ultrasound on 5/30 showed no explanation for patient's acute renal failure, but incidental  interval development of multiple liver lesions within the incidentally imaged right lobe of the liver.These lesions appear new and larger worrisome for metastatic disease.  GI was consulted. Esophagram had been performed today showing moderate fixed narrowing of the distal esophagus consistent with recurrent tumor and/or scarring.Oncology service has been informed of the patient's admission.  He complains of solid dysphagia, but he is able to drink liquids and the some solids. His chief complaint is back pain. He has pain at the shoulder blades and spine. He also has shoulder pain. He reports anorexia. He reports increased hoarseness.    Oncological History:  1. High-grade neuroendocrine carcinoma of the esophagus, clinical stage III. Staging PET scan 07/23/2013 showed a large  distal esophageal mass with neoplastic range FDG uptake, metastatic upper abdominal lymphadenopathy, indeterminate small pulmonary nodules. -initiation of radiation on 08/06/2013. Radiation completed 09/12/2013.  -Initiation of weekly carboplatin 08/07/2013. Final weekly carboplatin 08/28/2013.  -Restaging CT of the chest, abdomen, and pelvis on 09/25/2013: Decreased esophagus mass, decreased upper abdomen adenopathy. No new or progressive disease. -Cycle 1 etoposide/carboplatin 10/06/2013.  -Cycle 2 carboplatin/etoposide beginning 11/03/2013. Dose reduction of the carboplatin and etoposide with cycle 2 due to hematologic toxicity following cycle 1.  -Cycle 3 etoposide/carboplatin 12/02/2013.  -Cycle 4 carboplatin/etoposide beginning 12/29/2013.  Patient was on clinical remission as of 02/12/14.  2. History of Dysphagia secondary to #1.  3. History of weight loss secondary to #1 and #2 4. 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 He required red cell transfusions following cycle 3 and cycle 4 etoposide/carboplatin, last transfusion 02/09/2014 5. History of thrombocytopenia secondary to chemotherapy. 6. Hospitalization 10/20/2013 following a fall   Past Medical History  Diagnosis Date  . Dyslipidemia   . Hypertension   . History of colonic polyps   . Thrombocytopenia   . Nonproliferative diabetic retinopathy NOS(362.03)   . Impotence of organic origin   . Unspecified hereditary and idiopathic peripheral neuropathy   . Diabetes mellitus   . Depressive disorder, not elsewhere classified   . Coronary artery disease     a. s/p CABG 1997; b. LHC 04/2007: Distal left main 80% treated with PCI (Cypher DES), LIMA-LAD patent, SVG-OM patent, SVG-PDA patent, EF 30% with inferior HK  . Chronic systolic heart failure     a. Echo 09/2011: EF 30-35%, apical AK, basal inferior AK, mild AI, moderate MR, moderate LAE  . Anxiety state, unspecified   . Ischemic cardiomyopathy   . S/P ICD (internal  cardiac defibrillator) procedure   . Esophageal cancer   . Elevated PSA   . History of radiation therapy 08/06/13-09/12/13    esophageal  . Esophageal stricture      Past Surgical History  Procedure Laterality Date  . Coronary artery bypass graft      X 3  . Cardiac catheterization  05/23/07  . Insert / replace / remove pacemaker  07/05/07    [employer] SINGLE-CHAMBER DEFIBRILLATOR, Cristopher Peru, MD  . Esophagogastroduodenoscopy N/A 07/08/2013    Procedure: ESOPHAGOGASTRODUODENOSCOPY (EGD) with possible Dilation.;  Surgeon: Inda Castle, MD;  Location: [facility] ENDOSCOPY;  Service: Endoscopy;  Laterality: N/A;  . Esophagogastroduodenoscopy N/A 04/09/2014    Procedure: ESOPHAGOGASTRODUODENOSCOPY (EGD);  Surgeon: Inda Castle, MD;  Location: [facility];  Service: Endoscopy;  Laterality: N/A;  Possible Dil  . Balloon dilation N/A 04/09/2014    Procedure: BALLOON DILATION;  Surgeon: Inda Castle, MD;  Location: [facility] ENDOSCOPY;  Service: Endoscopy;  Laterality: N/A;  . Esophageal dilation       MEDICATIONS:  Scheduled Meds: . aspirin EC  81 mg Oral Daily  . atorvastatin  80 mg Oral QHS  . carvedilol  6.25 mg Oral BID WC  . citalopram  10 mg Oral Daily  . clopidogrel  75 mg Oral Daily  . docusate sodium  100 mg Oral BID  . folic acid  1 mg Oral Daily  . heparin  5,000 Units Subcutaneous 3 times per day  . insulin aspart  0-9 Units Subcutaneous TID WC  . insulin glargine  10 Units Subcutaneous Daily  . multivitamin with minerals  1 tablet Oral Daily  . sodium chloride  3 mL Intravenous Q12H  . thiamine  100 mg Oral Daily   Continuous Infusions:  PRN Meds:.acetaminophen, acetaminophen, alum & mag hydroxide-simeth, bisacodyl, nitroGLYCERIN, ondansetron (ZOFRAN) IV, ondansetron, oxyCODONE, sodium chloride  ALLERGIES:  Allergies  Allergen Reactions  . Ramipril Other (See Comments)    cough    History reviewed. No pertinent family history.    History   Social History  .  Marital Status: Married    Spouse Name: N/A    Number of Children: 4  . Years of Education: N/A   Occupational History  . PASTOR    Social History Main Topics  . Smoking status: Former Smoker -- 2 years    Types: Cigarettes    Quit date: 11/27/1994  . Smokeless tobacco: Never Used  . Alcohol Use: No  . Drug Use: No  . Sexual Activity: Not on file   Other Topics Concern  . Not on file   Social History Narrative   [employer]   MARRIED   4 BOYS   FORMER SMOKER, QUIT 14 YRS   ALCOHOL USE -NO   NO ILLICIT DRUG USE         ICD-[employer]      PATIENT SIGNED A DESIGNATED PARTY RELEASE TO ALLOW HIS WIFE, GLENDA Slauson, TO HAVE ACCESS TO HIS MEDICAL RECORDS/INFORMATION.    [name], March 21, 2010 9:42 AM     PHYSICAL EXAMINATION:   Filed Vitals:   04/27/14 0535  BP: 132/86  Pulse: 86  Temp: 98.3 F (36.8 C)  Resp: 20   Filed Weights   04/24/14 1946  Weight: 151 lb (68.14 kg)    74 year old in no acute distress,conversant, alert and oriented to time, place and date.  General well-developed and well-nourished  HEENT: Normocephalic, atraumatic.Sclera anicteric.Oral cavity without thrush or lesions. Neck:  No  mass. Lungs: Clear to auscultation. No wheezing, rhonchi or rales. Cardiac: Regular rate and rhythm, no murmur,rubs or gallops Abdomen: Soft nontender,bowel sounds x4. Nohepatosplenomegaly Extremities: No clubbing cyanosis or edema. No petechial rash Neuro: No focal or motor deficits Musculoskeletal: Tender over the spine. No mass.  LABORATORY/RADIOLOGY DATA:   Recent Labs Lab 04/24/14 2001 04/25/14 0445 04/26/14 0442 04/27/14 0432  WBC 11.5* 8.3 8.4 8.1  HGB 9.1* 7.8* 7.8* 7.5*  HCT 26.7* 23.5* 23.2* 22.2*  PLT 196 139* 141* 129*  MCV 87.8 89.7 90.6 91.0  MCH 29.9 29.8 30.5 30.7  MCHC 34.1 33.2 33.6 33.8  RDW 13.7 13.7 14.0 14.0    CMP    Recent Labs Lab 04/24/14 2001 04/25/14 0445 04/26/14 0442 04/27/14 0432  NA 124* 125*  131* 132*  K 4.7 4.3 4.3 3.7  CL 85* 90* 96 98  CO2 23 20 22 22   GLUCOSE 167* 172* 260* 170*  BUN 42* 35* 23 14  CREATININE 2.43* 1.81* 1.24 1.01  CALCIUM 9.4 8.4 8.7 8.9  AST 60* 48*  --   --   ALT 18 14  --   --   ALKPHOS 209* 168*  --   --   BILITOT 0.9 0.7  --   --         Component Value Date/Time   BILITOT 0.7 04/25/2014 0445   BILITOT 0.84 02/09/2014 0818   BILIDIR 0.2 01/03/2013 0859         Recent Labs  04/25/14 0445  TSH 2.760       Recent Labs Lab 04/25/14 0445  INR 1.24      Urinalysis    Component Value Date/Time   COLORURINE YELLOW 03/16/2014 1547   APPEARANCEUR CLEAR 03/16/2014 1547   LABSPEC 1.015 03/16/2014 1547   PHURINE 6.0 03/16/2014 1547   GLUCOSEU NEGATIVE 03/16/2014 1547   HGBUR NEGATIVE 03/16/2014 1547   BILIRUBINUR NEGATIVE 03/16/2014 1547   KETONESUR NEGATIVE 03/16/2014 1547   UROBILINOGEN 1.0 03/16/2014 1547   NITRITE NEGATIVE 03/16/2014 1547   LEUKOCYTESUR NEGATIVE 03/16/2014 1547     Liver Function Tests:  Recent Labs Lab 04/24/14 2001 04/25/14 0445  AST 60* 48*  ALT 18 14  ALKPHOS 209* 168*  BILITOT 0.9 0.7  PROT 6.8 5.5*  ALBUMIN 3.1* 2.4*   No results found for this basename: LIPASE, AMYLASE,  in the last 168 hours No results found for this basename: AMMONIA,  in the last 168 hours  CBG:  Recent Labs Lab 04/26/14 1235 04/26/14 1659 04/26/14 2103 04/27/14 0724 04/27/14 1144  GLUCAP 232* 218* 185* 173* 161*   Hgb A1c  Recent Labs  04/25/14 0445  HGBA1C 7.7*    Thyroid function studies  Recent Labs  04/25/14 0445  TSH 2.760    Radiology Studies:  Dg Chest 2 View  04/25/2014   CLINICAL DATA:  Follow-up pulmonary nodule  EXAM: CHEST  2 VIEW  COMPARISON:  04/24/2014  FINDINGS: Left apical rounded opacity again noted measuring approximately 3 cm. There is no focal parenchymal opacity, pleural effusion, or pneumothorax. Stable cardiomediastinal silhouette. Prior CABG. Single lead cardiac pacer again  noted.  The osseous structures are unremarkable.  IMPRESSION: Left apical rounded opacity again noted measuring approximately 3 cm. This may reflect round pneumonia versus abnormality within the chest wall soft tissues. The appearance is unlikely to reflect malignancy given the significant interval enlargement compared with 02/09/2014. Further evaluation with chest CT is recommended.   Electronically Signed   By: Kathreen Devoid  On: 04/25/2014 16:48   Dg Esophagus  04/27/2014   CLINICAL DATA:  Esophageal cancer.  Radiation therapy .  EXAM: ESOPHOGRAM/BARIUM SWALLOW  TECHNIQUE: Single contrast examination was performed using  thin barium .  FLUOROSCOPY TIME:  0 min 39 seconds.  COMPARISON:  CT 03/16/2014.  FINDINGS: This a slightly limited exam due to patient's condition. The upper and mid thoracic esophagus were widely patent. Narrowing of the distal esophagus is present. This appeared relatively fixed. Differential diagnosis includes of malignancy and or scarring. Small sliding hiatal hernia is present.  IMPRESSION: Moderate fixed narrowing of the distal esophagus consistent with recurrent tumor and/or scarring.   Electronically Signed   By: Marcello Moores  Register   On: 04/27/2014 12:07   US Renal  04/26/2014   CLINICAL DATA:  Acute renal failure, elevated creatinine, history of esophageal cancer, diabetes and hypertension  EXAM: RENAL/URINARY TRACT ULTRASOUND COMPLETE  COMPARISON:  CT abdomen pelvis - 02/09/2014  FINDINGS: Right Kidney:  Normal cortical thickness, echogenicity and size, measuring 11.9 cm in length. No focal renal lesions. No echogenic renal stones. No urinary obstruction.  Left Kidney:  Normal cortical thickness, echogenicity and size, measuring 12.1 cm in length. No focal renal lesions. No echogenic renal stones. No urinary obstruction.  Bladder:  Appears normal for degree of bladder distention.  Other: Note is made of multiple mixed echogenic solid lesions within the incidentally imaged right lobe  of the liver with dominant mass measuring approximately 4.2 x 3.2 cm (image 13) with additional lesion measuring approximately 2.4 x 2.0 cm (also seen on image 13).  IMPRESSION: 1. No explanation for patient's acute renal failure. Specifically, no evidence of urinary obstruction. 2. Interval development of multiple liver lesions within the incidentally imaged right lobe of the liver. While several smaller hyperenhancing liver lesions were noted on prior abdominal CT performed 02/09/2014, these lesions appear new and larger and given history of esophageal cancer are worrisome for metastatic disease. Further evaluation with abdominal CT scan and/or dedicated abdominal ultrasound as clinically indicated.   Electronically Signed   By: Sandi Mariscal M.D.   On: 04/26/2014 08:47   Dg Chest Port 1 View  04/24/2014   CLINICAL DATA:  73 year old male with hypotension. Pain. Initial encounter.  EXAM: PORTABLE CHEST - 1 VIEW  COMPARISON:  03/14/2014 and earlier.  FINDINGS: Portable AP upright view at 2017 hrs. New since last month is indistinct 3 cm nodular opacity in the left lung apex. There seems to be some additional associated extra pulmonary density near the left shoulder (horizontal arrow).  Otherwise improved lung base ventilation. Lung parenchyma elsewhere appears stable with no pneumothorax or effusion. Stable cardiomegaly and mediastinal contours. Stable left chest cardiac AICD. Visualized tracheal air column is within normal limits.  IMPRESSION: 1. A left upper lobe 3 cm pulmonary nodule is new since last month, suggesting either infectious etiology or artifact. PA and lateral chest radiographs would be valuable when possible. Alternatively, chest CT (IV contrast preferred) could be utilized. 2. Improved bibasilar ventilation since April. No other new cardiopulmonary abnormality identified.   Electronically Signed   By: Lars Pinks M.D.   On: 04/24/2014 20:30       ASSESSMENT AND PLAN:  1. High-grade  neuroendocrine carcinoma of the esophagus, s/p 4 cycles of  Chemotherapy with carboplatin and etoposide, first cycle on 10/06/13. Last chemo give on 01/18/14. He received radiation to the esophagus from Sept to October of 2014.  He was in clinical remission until 02/12/14  2. Dysphagia  Secondary to #1. Esophageal stricture noted on an upper endoscopy 04/09/2014 and a barium swallow 04/27/2014  3. Anemia Secondary to #1. Possible slow GI bleed from friable malignant tissue in esophagus, he may have bone marrow involvement with tumor   4. Thrombocytopenia   5. pain-I am concerned he has developed bone metastases  6. hoarseness-likely related to recurrent laryngeal nerve involvement with tumor  7. admission with dehydration  8. History of coronary artery disease and cardiomyopathy  9. diabetes    Other medical issues as per admitting team   **Disclaimer: This note was dictated with voice recognition software. Similar sounding words can inadvertently be transcribed and this note may contain transcription errors which may not have been corrected upon publication of note.**  Rondel Jumbo, PA-C 04/27/2014, 2:27 PM  Billy Bush was admitted with dehydration and failure to thrive. He complains of pain in the back. A recent upper endoscopy reveals evidence of local tumor progression in the esophagus, confirmed on a barium swallow today. I am concerned he has developed systemic progression of the high-grade neuroendocrine tumor of the esophagus.  I discussed this likelihood with Billy Bush and his family. He reports he can tolerate liquids at present.  Recommendations: 1. Add a long acting narcotic for pain control 2. Staging chest CT , bone scan if the CT does not confirmed metastatic disease 3. A full liquid diet or as recommended by GI 4. transfuse packed red blood cells 5. I will discuss treatment options with Billy Bush and his family if a diagnosis of progressive metastatic carcinoma  is confirmed.

## 2014-04-27 NOTE — Progress Notes (Signed)
[facility] Progress Note  Subjective:  No new issues except he had some orthostasis this AM.  Objective:  Vital signs in last 24 hours: Temp:  [97.9 F (36.6 C)-98.4 F (36.9 C)] 98.3 F (36.8 C) (06/01 0535) Pulse Rate:  [82-89] 86 (06/01 0535) Resp:  [20] 20 (06/01 0535) BP: (98-132)/(50-86) 132/86 mmHg (06/01 0535) SpO2:  [100 %] 100 % (06/01 0535) Last BM Date: 04/25/14 General:  Alert, chronically ill-appearing, in NAD Heart:  Regular rate and rhythm; no murmurs Pulm:  CTAB.  No W/R/R. Abdomen:  Soft, non-distended. Normal bowel sounds.  Non-tender. Extremities:  Without edema. Neurologic:  Alert and  oriented x4;  grossly normal neurologically. Psych:  Alert and cooperative. Normal mood and affect.  Intake/Output from previous day: 05/31 0701 - 06/01 0700 In: 1074.2 [P.O.:480; I.V.:594.2] Out: 1150 [Urine:1150]  Lab Results:  Recent Labs  04/25/14 0445 04/26/14 0442 04/27/14 0432  WBC 8.3 8.4 8.1  HGB 7.8* 7.8* 7.5*  HCT 23.5* 23.2* 22.2*  PLT 139* 141* 129*   BMET  Recent Labs  04/25/14 0445 04/26/14 0442 04/27/14 0432  NA 125* 131* 132*  K 4.3 4.3 3.7  CL 90* 96 98  CO2 20 22 22   GLUCOSE 172* 260* 170*  BUN 35* 23 14  CREATININE 1.81* 1.24 1.01  CALCIUM 8.4 8.7 8.9   LFT  Recent Labs  04/25/14 0445  PROT 5.5*  ALBUMIN 2.4*  AST 48*  ALT 14  ALKPHOS 168*  BILITOT 0.7   PT/INR  Recent Labs  04/25/14 0445  LABPROT 15.3*  INR 1.24   Dg Chest 2 View  04/25/2014   CLINICAL DATA:  Follow-up pulmonary nodule  EXAM: CHEST  2 VIEW  COMPARISON:  04/24/2014  FINDINGS: Left apical rounded opacity again noted measuring approximately 3 cm. There is no focal parenchymal opacity, pleural effusion, or pneumothorax. Stable cardiomediastinal silhouette. Prior CABG. Single lead cardiac pacer again noted.  The osseous structures are unremarkable.  IMPRESSION: Left apical rounded opacity again noted measuring approximately 3 cm. This  may reflect round pneumonia versus abnormality within the chest wall soft tissues. The appearance is unlikely to reflect malignancy given the significant interval enlargement compared with 02/09/2014. Further evaluation with chest CT is recommended.   Electronically Signed   By: Kathreen Devoid   On: 04/25/2014 16:48   US Renal  04/26/2014   CLINICAL DATA:  Acute renal failure, elevated creatinine, history of esophageal cancer, diabetes and hypertension  EXAM: RENAL/URINARY TRACT ULTRASOUND COMPLETE  COMPARISON:  CT abdomen pelvis - 02/09/2014  FINDINGS: Right Kidney:  Normal cortical thickness, echogenicity and size, measuring 11.9 cm in length. No focal renal lesions. No echogenic renal stones. No urinary obstruction.  Left Kidney:  Normal cortical thickness, echogenicity and size, measuring 12.1 cm in length. No focal renal lesions. No echogenic renal stones. No urinary obstruction.  Bladder:  Appears normal for degree of bladder distention.  Other: Note is made of multiple mixed echogenic solid lesions within the incidentally imaged right lobe of the liver with dominant mass measuring approximately 4.2 x 3.2 cm (image 13) with additional lesion measuring approximately 2.4 x 2.0 cm (also seen on image 13).  IMPRESSION: 1. No explanation for patient's acute renal failure. Specifically, no evidence of urinary obstruction. 2. Interval development of multiple liver lesions within the incidentally imaged right lobe of the liver. While several smaller hyperenhancing liver lesions were noted on prior abdominal CT performed 02/09/2014, these lesions appear new and larger and  given history of esophageal cancer are worrisome for metastatic disease. Further evaluation with abdominal CT scan and/or dedicated abdominal ultrasound as clinically indicated.   Electronically Signed   By: Sandi Mariscal M.D.   On: 04/26/2014 08:47    Assessment / Plan: -Known esophageal cancer s/p chemo and radiation with residual tumor seen on  EGD 04/09/2014  -Dysphagia with radiation induced esophageal stricture dilated on 04/09/2014 with balloon dilator.  Current symptoms do not reflect dysphagia as he was having pre-procedure.  -Acute on chronic anemia: Likely secondary to his malignancy and possibly slow GI bleed from friable malignant tissue in the esophagus.  -Multiple other medical problems per primary service.   *Await results of esophagram.  ? If stent placement would be helpful, which can be decided once the esophogram results return.  He is on plavix, which would obviously need to be held/washed out prior to any procedure with dilation or stent placement so any procedure will likely be planned as outpatient.   LOS: 3 days   Billy Emperor. Zehr  04/27/2014, 8:13 AM  Pager number [phone]  Attending MD note:   I have taken a history, examined the patient, and reviewed the chart. I agree with the Advanced Practitioner's impression and recommendations. Barium esophagram confirms smooth distal es. Stricture, likely malignant, normal esophagus proximal to the stricture. I suspect distal esophageal mass encircling the distal lumen. Will hold Plavix,  And discuss with Dr Benay Spice possible additional chemo Rx. Consider re dilation vs es. Stent place,emy ( Dr Deatra Ina out of town).  [facility] Pager # [phone]

## 2014-04-28 ENCOUNTER — Other Ambulatory Visit: Payer: Medicare HMO

## 2014-04-28 DIAGNOSIS — D630 Anemia in neoplastic disease: Secondary | ICD-10-CM

## 2014-04-28 DIAGNOSIS — C7B8 Other secondary neuroendocrine tumors: Secondary | ICD-10-CM

## 2014-04-28 LAB — CBC
HCT: 27.6 % — ABNORMAL LOW (ref 39.0–52.0)
HEMOGLOBIN: 9.3 g/dL — AB (ref 13.0–17.0)
MCH: 30.3 pg (ref 26.0–34.0)
MCHC: 33.7 g/dL (ref 30.0–36.0)
MCV: 89.9 fL (ref 78.0–100.0)
PLATELETS: 114 10*3/uL — AB (ref 150–400)
RBC: 3.07 MIL/uL — AB (ref 4.22–5.81)
RDW: 14.4 % (ref 11.5–15.5)
WBC: 7.7 10*3/uL (ref 4.0–10.5)

## 2014-04-28 LAB — BASIC METABOLIC PANEL
BUN: 11 mg/dL (ref 6–23)
CO2: 24 mEq/L (ref 19–32)
Calcium: 9.1 mg/dL (ref 8.4–10.5)
Chloride: 97 mEq/L (ref 96–112)
Creatinine, Ser: 1.02 mg/dL (ref 0.50–1.35)
GFR calc Af Amer: 84 mL/min — ABNORMAL LOW (ref >90)
GFR calc non Af Amer: 72 mL/min — ABNORMAL LOW (ref >90)
GLUCOSE: 170 mg/dL — AB (ref 70–99)
POTASSIUM: 3.5 meq/L — AB (ref 3.7–5.3)
SODIUM: 133 meq/L — AB (ref 137–147)

## 2014-04-28 LAB — GLUCOSE, CAPILLARY
GLUCOSE-CAPILLARY: 178 mg/dL — AB (ref 70–99)
Glucose-Capillary: 158 mg/dL — ABNORMAL HIGH (ref 70–99)
Glucose-Capillary: 134 mg/dL — ABNORMAL HIGH (ref 70–99)
Glucose-Capillary: 140 mg/dL — ABNORMAL HIGH (ref 70–99)

## 2014-04-28 LAB — TYPE AND SCREEN
ABO/RH(D): A POS
Antibody Screen: NEGATIVE
Unit division: 0
Unit division: 0

## 2014-04-28 NOTE — Progress Notes (Signed)
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 In the ED, patient was noted to hypotensive, sodium 124, creatinine 2.43, hemoglobin 9.1 and chest x-ray showing new left upper lobe pulmonary nodule. Dehydration and acute renal failure resolved with IV hydration. Patient continues to be intermittently orthostatic. Transfused for anemia. Dysphagia issue ongoing-patient and family deciding regarding further management. Oncology evaluated and patient now has metastatic disease and recommending hospice care. Patient discussing with family today regarding further course.  Assessment/Plan:  1. Dehydration: Secondary to poor oral intake and diuretics. Patient having difficulty with by mouth intake secondary to dysphagia. Resolved after IV fluids. At risk for recurrent dehydration from poor oral intake related to dysphagia. 2. Acute renal failure: Creatinine in March was 1 and on 5/22 was 1.5. Likely secondary to dehydration, hypotension and ACEI/ARB. Improved. No hydronephrosis on renal ultrasound. Resolved after IV fluid. 3. Hyponatremia: May be secondary to dehydration versus SIADH. Stable. Asymptomatic. Improved. 4. Hypotension/orthostatic hypotension: Reduced dose of carvedilol. Clinically euvolemic or even slightly hypervolemic post IV hydration but continues to  be orthostatic. Bilateral lower extremity TED hose. Monitor. PRBC transfusion may help. 5. Anemia: Likely secondary to chronic disease and malignancy. No reported bleeding. Follow CBCs and transfuse hemoglobin less than 7 g per DL. Status post 2 units of PRBCs and improved. 6. Thrombocytopenia: Follow daily CBCs. Stable 7. History of CAD/CABG & DES 2 left main/ischemic cardiomyopathy LVEF 25-30%/[employer] ICD: Recently saw cardiology on 5/23-continue Plavix, aspirin, statins. Reduced dose of carvedilol secondary to hypotension. Temporarily hold ramipril secondary to hypotension and acute on chronic kidney disease. Plavix held by GI in case intervention planned for dysphagia. 8. History of chronic systolic CHF: Apparently runs soft blood pressures. Hold Lasix, spironolactone, ACEI/ARB temporarily. Monitor closely. Euvolemic or starting to get slightly hypervolemic. Monitor closely 9. History of hyperlipidemia: Continue statins 10. Back pain: Likely secondary to bony metastasis. Controlled. 11. Esophageal cancer (high-grade neuroendocrine carcinoma)/dysphagia/hoarseness (recurrent laryngeal nerve palsy): Continued dysphagia. He is status post EGD and dilatation of radiation related stricture on 5/14. GI consultation and followup appreciated-barium esophagram confirms smooth distal esophageal stricture-likely malignant. Further workup by CT chest 6/1 showed progressive metastatic disease involving pleura, liver, chest lymph nodes and bones. Dr. Benay Spice states that patient has small chance of clinical improvement with further chemotherapy and his current performance status is poor. He recommends hospice care. GI will evaluate regarding esophageal stent if patient desires. Plavix on hold. 12. Uncontrolled DM 1 with renal complications: SSI. Added Lantus 10 units subcutaneous daily.  Code Status: Full Family Communication: Discussed with patient's son at bedside. Disposition Plan: Home when medically  stable.   Consultants:  Gastroenterology  Oncology  Procedures:  None  Antibiotics:  IV Rocephin 5/29-DC'd   Subjective: Back pain controlled. States that dysphagia is the same.   Objective: Filed Vitals:   04/27/14 2235 04/27/14 2315 04/28/14 0500 04/28/14 1300  BP: 115/60 102/52 98/69 90/56   Pulse: 86 94 137 111  Temp: 97.9 F (36.6 C) 98.4 F (36.9 C)  98.1 F (36.7 C) 98.2 F (36.8 C)  TempSrc: Oral Oral Oral Oral  Resp: 18 18 18 18   Height:      Weight:      SpO2: 100% 99% 99% 99%    Intake/Output Summary (Last 24 hours) at 04/28/14 1508 Last data filed at 04/28/14 1300  Gross per 24 hour  Intake 1209.17 ml  Output   1170 ml  Net  39.17 ml   Filed Weights   04/24/14 1946  Weight: 68.493 kg (151 lb)     Exam:  General exam: Pleasant elderly frail male sitting up on chair. Respiratory system: Distant breath sounds but clear to auscultation. No increased work of breathing. Cardiovascular system: S1 & S2 heard, RRR. No JVD, murmurs, gallops, clicks. Trace ankle edema.  Gastrointestinal system: Abdomen is nondistended, soft and nontender. Normal bowel sounds heard. Central nervous system: Alert and oriented. No focal neurological deficits. Extremities: Symmetric 5 x 5 power.   Data Reviewed: Basic Metabolic Panel:  Recent Labs Lab 04/24/14 2001 04/25/14 0445 04/26/14 0442 04/27/14 0432 04/28/14 0405  NA 124* 125* 131* 132* 133*  K 4.7 4.3 4.3 3.7 3.5*  CL 85* 90* 96 98 97  CO2 23 20 22 22 24   GLUCOSE 167* 172* 260* 170* 170*  BUN 42* 35* 23 14 11   CREATININE 2.43* 1.81* 1.24 1.01 1.02  CALCIUM 9.4 8.4 8.7 8.9 9.1   Liver Function Tests:  Recent Labs Lab 04/24/14 2001 04/25/14 0445  AST 60* 48*  ALT 18 14  ALKPHOS 209* 168*  BILITOT 0.9 0.7  PROT 6.8 5.5*  ALBUMIN 3.1* 2.4*   No results found for this basename: LIPASE, AMYLASE,  in the last 168 hours No results found for this basename: AMMONIA,  in the last 168  hours CBC:  Recent Labs Lab 04/24/14 2001 04/25/14 0445 04/26/14 0442 04/27/14 0432 04/28/14 0405  WBC 11.5* 8.3 8.4 8.1 7.7  HGB 9.1* 7.8* 7.8* 7.5* 9.3*  HCT 26.7* 23.5* 23.2* 22.2* 27.6*  MCV 87.8 89.7 90.6 91.0 89.9  PLT 196 139* 141* 129* 114*   Cardiac Enzymes: No results found for this basename: CKTOTAL, CKMB, CKMBINDEX, TROPONINI,  in the last 168 hours BNP (last 3 results)  Recent Labs  01/05/14 0814 01/15/14 1541 04/17/14 0847  PROBNP 390.0* 292.0* 456.0*   CBG:  Recent Labs Lab 04/27/14 1144 04/27/14 1813 04/27/14 2059 04/28/14 0746 04/28/14 1203  GLUCAP 161* 162* 167* 178* 158*    Recent Results (from the past 240 hour(s))  CULTURE, BLOOD (ROUTINE X 2)     Status: None   Collection Time    04/24/14  8:05 PM      Result Value Ref Range Status   Specimen Description BLOOD LEFT ARM   Final   Special Requests BOTTLES DRAWN AEROBIC AND ANAEROBIC 5CC   Final   Culture  Setup Time     Final   Value: 04/25/2014 00:45     Performed at [facility]   Culture     Final   Value:        BLOOD CULTURE RECEIVED NO GROWTH TO DATE CULTURE WILL BE HELD FOR 5 DAYS BEFORE ISSUING A FINAL NEGATIVE REPORT     Performed at [facility]   Report Status PENDING   Incomplete  CULTURE, BLOOD (ROUTINE X 2)     Status: None   Collection Time    04/24/14 10:26 PM      Result Value Ref Range Status  Specimen Description BLOOD RIGHT ARM   Final   Special Requests BOTTLES DRAWN AEROBIC AND ANAEROBIC 5CC   Final   Culture  Setup Time     Final   Value: 04/25/2014 00:45     Performed at [facility]   Culture     Final   Value:        BLOOD CULTURE RECEIVED NO GROWTH TO DATE CULTURE WILL BE HELD FOR 5 DAYS BEFORE ISSUING A FINAL NEGATIVE REPORT     Performed at [facility]   Report Status PENDING   Incomplete          Studies: Ct Chest Wo Contrast  04/27/2014   CLINICAL DATA:  History of esophageal cancer. Back pain. Currently  undergoing chemotherapy and radiation therapy.  EXAM: CT CHEST WITHOUT CONTRAST  TECHNIQUE: Multidetector CT imaging of the chest was performed following the standard protocol without IV contrast.  COMPARISON:  CT of the chest, abdomen and pelvis 02/09/2014.  FINDINGS: Mediastinum: There are 2 masslike areas in the mediastinum, 1 in the mediastinum immediately posterior to the carina (image 30 of series 2) measuring 4.8 x 3.3 cm, which either arises from the esophagus or is immediately adjacent to the esophagus (favored, likely an enlarged lymph node) exerting significant mass effect upon the esophagus. There is also a 3.2 x 2.7 cm mass in the superior mediastinum to the left side of the trachea and esophagus exerting mass effect upon all adjacent structures (image 13 of series 2). In the distal esophagus immediately above the gastroesophageal junction there is also masslike enlargement which distorts the small amount of oral contrast material in the lumen, best appreciated on image 50 of series 2. Heart size is mildly enlarged. There is no significant pericardial fluid, thickening or pericardial calcification. There is atherosclerosis of the thoracic aorta, the great vessels of the mediastinum and the coronary arteries, including calcified atherosclerotic plaque in the left main, left anterior descending, left circumflex and right coronary arteries. Stent in left main coronary artery and likely in the mid to distal right coronary artery. Status post median sternotomy for CABG, including LIMA to LAD. Left-sided pacemaker device in position with lead tip terminating in the right ventricular free wall. Dilatation of the pulmonic trunk (4.5 cm in diameter), suggesting underlying pulmonary arterial hypertension.  Lungs/Pleura: Moderate bilateral pleural effusions layer dependently and are simple in appearance on today's non contrast CT examination. These are associated with extensive passive atelectasis throughout the  lower lobes of the lungs bilaterally. There are a few patchy micronodules in the periphery of the lungs bilaterally which are nonspecific and favored to be infectious or inflammatory. However, there is also a large pleural-based nodule in the anterior aspect of the left upper lobe best appreciated on image 14 of series 2 measuring approximately 2.9 x 2.9 cm, with apparent direct chest wall invasion between the left first and second ribs. Multiple areas of new nodular pleural thickening are noted in the lateral aspect of the right hemithorax, best appreciated on image 22 and image 31 of series 2, suspicious for pleural metastases.  Upper Abdomen: Small volume of perihepatic ascites. Several ill-defined low-attenuation hepatic lesions are noted on today's examination, poorly evaluated on this noncontrast CT study, however, the largest of these is estimated to measure approximately 2.6 x 2.2 cm (image 59 of series 2), and these lesions appear either new or enlarged compared to the prior examination from 02/09/2014, highly concerning for metastasis.  Musculoskeletal: Multiples slightly ill-defined mixed  lytic and sclerotic areas are noted throughout the visualized axial and appendicular skeleton, suspicious for multifocal metastasis. Specific examples include the lateral aspect of the right fourth rib, lateral aspect of the right sixth rib, lateral aspect of the left fifth rib, as well as multiple small lesions throughout the thoracic spine.  IMPRESSION: 1. Today's study demonstrates progression of disease in the thorax and upper abdomen, with enlargement of apparent esophageal mass immediately above the gastroesophageal junction, progression of mediastinal adenopathy, new pulmonary and pleural metastases, presumably malignant bilateral pleural effusions than or moderate in size, multiple new and enlarging liver lesions, and multiple skeletal lesions, as detailed above. 2. Additional incidental findings, as above.    Electronically Signed   By: Vinnie Langton M.D.   On: 04/27/2014 17:37   Dg Esophagus  04/27/2014   CLINICAL DATA:  Esophageal cancer.  Radiation therapy .  EXAM: ESOPHOGRAM/BARIUM SWALLOW  TECHNIQUE: Single contrast examination was performed using  thin barium .  FLUOROSCOPY TIME:  0 min 39 seconds.  COMPARISON:  CT 03/16/2014.  FINDINGS: This a slightly limited exam due to patient's condition. The upper and mid thoracic esophagus were widely patent. Narrowing of the distal esophagus is present. This appeared relatively fixed. Differential diagnosis includes of malignancy and and or scarring. Small sliding hiatal hernia is present.  IMPRESSION: Moderate fixed narrowing of the distal esophagus consistent with recurrent tumor and/or scarring.   Electronically Signed   By: Marcello Moores  Register   On: 04/27/2014 12:07        Scheduled Meds: . aspirin EC  81 mg Oral Daily  . atorvastatin  80 mg Oral QHS  . carvedilol  6.25 mg Oral BID WC  . citalopram  10 mg Oral Daily  . docusate sodium  100 mg Oral BID  . folic acid  1 mg Oral Daily  . heparin  5,000 Units Subcutaneous 3 times per day  . insulin aspart  0-5 Units Subcutaneous QHS  . insulin aspart  0-9 Units Subcutaneous TID WC  . insulin glargine  10 Units Subcutaneous Daily  . morphine  15 mg Oral Q12H  . multivitamin with minerals  1 tablet Oral Daily  . sodium chloride  3 mL Intravenous Q12H  . thiamine  100 mg Oral Daily   Continuous Infusions:    Principal Problem:   Hyponatremia Active Problems:   DIABETES MELLITUS, TYPE I   Other and unspecified hyperlipidemia   ANEMIA, IRON DEFICIENCY   THROMBOCYTOPENIA   HYPERTENSION   CORONARY ARTERY DISEASE   SYSTOLIC HEART FAILURE, CHRONIC   Hypotension   Dehydration   Acute renal failure   Protein-calorie malnutrition, severe    Time spent: 25 minutes    Modena Jansky, MD, FACP, [facility] Pager [phone]  If 7PM-7AM, please contact  night-coverage [email or web address] Password [email or web address] 04/28/2014, 3:08 PM    LOS: 4 days

## 2014-04-28 NOTE — Progress Notes (Signed)
Notified by Shirlean Kelly CMRN pt/family requesting services of Hospice and [place] after discharge. Spoke briefly at bedside with pt, wife Holley Raring, sons Webb Silversmith and Ronalee Belts and d-i-l [name] to initiate information related to [facility] services at home, philosophy and team approach to care; with good understanding of information provided voiced by pt and family Discharge date uncertain at this time per pt/wife they will be talking with GI doctor about possibility to EGD prior to discharge. Writer will follow up tomorrow with family and team.   Upon review of chart writer noted pt does have ICD in place and is currently a Full Code; Probation officer did follow up briefly with pt and wife and son Ronalee Belts to ask about their thoughts regarding code status and AD; assuring them that they did not have to make any decisions tonight per pt/wife they have filled out a Living Will, however have not, at this time, decided on Code Status or when/if to deactivate defibrillator- they are open to further discussion with Dr Benay Spice and primary team on this issue.  Pt information sent to [place] will follow-up tomorrow.  Danton Sewer, RN, MSN, [facility] Liaison [phone]

## 2014-04-28 NOTE — Progress Notes (Addendum)
[facility] Progress Note  Subjective:  No new issues or complaints.  Objective:  Vital signs in last 24 hours: Temp:  [97.9 F (36.6 C)-98.7 F (37.1 C)] 98.1 F (36.7 C) (06/02 0500) Pulse Rate:  [83-137] 137 (06/02 0500) Resp:  [16-20] 18 (06/02 0500) BP: (95-122)/(45-69) 98/69 mmHg (06/02 0500) SpO2:  [99 %-100 %] 99 % (06/02 0500) Last BM Date: 04/25/14 General:  Alert, chronically ill-appearing, in NAD Heart:  Tachy; no murmurs Pulm:  CTAB.  No W/R/R. Abdomen:  Soft, non-distended.  BS present.  Non-tender.   Extremities:  Without edema. Neurologic:  Alert and  oriented x4;  grossly normal neurologically. Psych:  Alert and cooperative. Normal mood and affect.  Intake/Output from previous day: 06/01 0701 - 06/02 0700 In: 1475 [P.O.:580; I.V.:265.8; Blood:629.2] Out: 620 [Urine:620] Intake/Output this shift: Total I/O In: 240 [P.O.:240] Out: 350 [Urine:350]  Lab Results:  Recent Labs  04/26/14 0442 04/27/14 0432 04/28/14 0405  WBC 8.4 8.1 7.7  HGB 7.8* 7.5* 9.3*  HCT 23.2* 22.2* 27.6*  PLT 141* 129* 114*   BMET  Recent Labs  04/26/14 0442 04/27/14 0432 04/28/14 0405  NA 131* 132* 133*  K 4.3 3.7 3.5*  CL 96 98 97  CO2 22 22 24   GLUCOSE 260* 170* 170*  BUN 23 14 11   CREATININE 1.24 1.01 1.02  CALCIUM 8.7 8.9 9.1   Ct Chest Wo Contrast  04/27/2014   CLINICAL DATA:  History of esophageal cancer. Back pain. Currently undergoing chemotherapy and radiation therapy.  EXAM: CT CHEST WITHOUT CONTRAST  TECHNIQUE: Multidetector CT imaging of the chest was performed following the standard protocol without IV contrast.  COMPARISON:  CT of the chest, abdomen and pelvis 02/09/2014.  FINDINGS: Mediastinum: There are 2 masslike areas in the mediastinum, 1 in the mediastinum immediately posterior to the carina (image 30 of series 2) measuring 4.8 x 3.3 cm, which either arises from the esophagus or is immediately adjacent to the esophagus (favored,  likely an enlarged lymph node) exerting significant mass effect upon the esophagus. There is also a 3.2 x 2.7 cm mass in the superior mediastinum to the left side of the trachea and esophagus exerting mass effect upon all adjacent structures (image 13 of series 2). In the distal esophagus immediately above the gastroesophageal junction there is also masslike enlargement which distorts the small amount of oral contrast material in the lumen, best appreciated on image 50 of series 2. Heart size is mildly enlarged. There is no significant pericardial fluid, thickening or pericardial calcification. There is atherosclerosis of the thoracic aorta, the great vessels of the mediastinum and the coronary arteries, including calcified atherosclerotic plaque in the left main, left anterior descending, left circumflex and right coronary arteries. Stent in left main coronary artery and likely in the mid to distal right coronary artery. Status post median sternotomy for CABG, including LIMA to LAD. Left-sided pacemaker device in position with lead tip terminating in the right ventricular free wall. Dilatation of the pulmonic trunk (4.5 cm in diameter), suggesting underlying pulmonary arterial hypertension.  Lungs/Pleura: Moderate bilateral pleural effusions layer dependently and are simple in appearance on today's non contrast CT examination. These are associated with extensive passive atelectasis throughout the lower lobes of the lungs bilaterally. There are a few patchy micronodules in the periphery of the lungs bilaterally which are nonspecific and favored to be infectious or inflammatory. However, there is also a large pleural-based nodule in the anterior aspect of the left upper  lobe best appreciated on image 14 of series 2 measuring approximately 2.9 x 2.9 cm, with apparent direct chest wall invasion between the left first and second ribs. Multiple areas of new nodular pleural thickening are noted in the lateral aspect of  the right hemithorax, best appreciated on image 22 and image 31 of series 2, suspicious for pleural metastases.  Upper Abdomen: Small volume of perihepatic ascites. Several ill-defined low-attenuation hepatic lesions are noted on today's examination, poorly evaluated on this noncontrast CT study, however, the largest of these is estimated to measure approximately 2.6 x 2.2 cm (image 59 of series 2), and these lesions appear either new or enlarged compared to the prior examination from 02/09/2014, highly concerning for metastasis.  Musculoskeletal: Multiples slightly ill-defined mixed lytic and sclerotic areas are noted throughout the visualized axial and appendicular skeleton, suspicious for multifocal metastasis. Specific examples include the lateral aspect of the right fourth rib, lateral aspect of the right sixth rib, lateral aspect of the left fifth rib, as well as multiple small lesions throughout the thoracic spine.  IMPRESSION: 1. Today's study demonstrates progression of disease in the thorax and upper abdomen, with enlargement of apparent esophageal mass immediately above the gastroesophageal junction, progression of mediastinal adenopathy, new pulmonary and pleural metastases, presumably malignant bilateral pleural effusions than or moderate in size, multiple new and enlarging liver lesions, and multiple skeletal lesions, as detailed above. 2. Additional incidental findings, as above.   Electronically Signed   By: Vinnie Langton M.D.   On: 04/27/2014 17:37   Dg Esophagus  04/27/2014   CLINICAL DATA:  Esophageal cancer.  Radiation therapy .  EXAM: ESOPHOGRAM/BARIUM SWALLOW  TECHNIQUE: Single contrast examination was performed using  thin barium .  FLUOROSCOPY TIME:  0 min 39 seconds.  COMPARISON:  CT 03/16/2014.  FINDINGS: This a slightly limited exam due to patient's condition. The upper and mid thoracic esophagus were widely patent. Narrowing of the distal esophagus is present. This appeared  relatively fixed. Differential diagnosis includes of malignancy and and or scarring. Small sliding hiatal hernia is present.  IMPRESSION: Moderate fixed narrowing of the distal esophagus consistent with recurrent tumor and/or scarring.   Electronically Signed   By: Marcello Moores  Register   On: 04/27/2014 12:07    Assessment / Plan: -Known esophageal cancer s/p chemo and radiation with residual tumor seen on EGD 04/09/2014.  CT of the chest yesterday shows significant progression of the disease the thorax and upper abdomen.  -Dysphagia with radiation induced esophageal stricture dilated on 04/09/2014 with balloon dilator. Esophagram findings show narrowing at the distal esophagus c/w tumor. -Acute on chronic anemia: Likely secondary to his malignancy and possibly slow GI bleed from friable malignant tissue in the esophagus.  Hgb improved/stable s/p 1 unit of PRBC's.  -Multiple other medical problems per primary service.   *Await recommendations from oncology now that CT chest has been resulted.  No plans for stent placement currently.  Likely any further GI procedures would be planned as outpatient as patient is on Plavix.    LOS: 4 days   Billy Bush. Zehr  04/28/2014, 9:02 AM  Pager number [phone]  Attending MD note:   I have taken a history, examined the patient, and reviewed the chart. I agree with the Advanced Practitioner's impression and recommendations. Family still not sure whther dilation may not have to be repeated because of regurgitation of secretions with meals. Will discuss with Dr Deatra Ina when he returns tomorrow.  Will place Plavix on hold for now.  [facility] Pager # [phone]

## 2014-04-28 NOTE — Progress Notes (Signed)
CARE MANAGEMENT NOTE 04/28/2014  Patient:  Billy Bush, Billy Bush   Account Number:  [id number]  Date Initiated:  04/26/2014  Documentation initiated by:  [facility]  Subjective/Objective Assessment:   Dysphagia, esophageal cancer     Action/Plan:   Anticipated DC Date:  04/28/2014   Anticipated DC Plan:  [place]  CM consult      [facility] Choice  HOME HEALTH   Choice offered to / List presented to:  C-4 Adult Children           Status of service:  In process, will continue to follow Medicare Important Message given?  YES (If response is "NO", the following Medicare IM given date fields will be blank) Date Medicare IM given:  04/24/2014 Date Additional Medicare IM given:  04/28/2014  Discharge Disposition:    Per UR Regulation:  Reviewed for med. necessity/level of care/duration of stay  If discussed at [place] of Stay Meetings, dates discussed:    Comments:  04/28/14 [facility], RN, BSN Spoke with pt's wife, son and daughter in Sports coach. [name], RN is one of p's daughter in law. Family wanted to speak with her concerning Hospice. [name] states family selected [facility], referral given to in house rep.  04/25/2014 1700 NCM spoke to dtr-in-law, [name]. Requesting [facility] for [facility]. Waiting for final [place] orders for home. [name] RN CCM Case Mgmt phone [phone]

## 2014-04-28 NOTE — Progress Notes (Signed)
[facility]  [phone]   Requesting Provider: Triad Bush  Consulting Provider: Leighton Bush. Billy Bush  Primary Oncologist: Billy Bush. Billy Bush CONSULTATION  NOTE  Reason for Consultation: Esophageal Cancer  HPI:  He reports no significant pain at present. He has intermittent coughing when he eats. He reports he can tolerate liquids.  Oncological History:  1. High-grade neuroendocrine carcinoma of the esophagus, clinical stage III. Staging PET scan 07/23/2013 showed a large distal esophageal mass with neoplastic range FDG uptake, metastatic upper abdominal lymphadenopathy, indeterminate small pulmonary nodules. -initiation of radiation on 08/06/2013. Radiation completed 09/12/2013.  -Initiation of weekly carboplatin 08/07/2013. Final weekly carboplatin 08/28/2013.  -Restaging CT of the chest, abdomen, and pelvis on 09/25/2013: Decreased esophagus mass, decreased upper abdomen adenopathy. No new or progressive disease. -Cycle 1 etoposide/carboplatin 10/06/2013.  -Cycle 2 carboplatin/etoposide beginning 11/03/2013. Dose reduction of the carboplatin and etoposide with cycle 2 due to hematologic toxicity following cycle 1.  -Cycle 3 etoposide/carboplatin 12/02/2013.  -Cycle 4 carboplatin/etoposide beginning 12/29/2013.  Patient was on clinical remission as of 02/12/14.  2. History of Dysphagia secondary to #1.  3. History of weight loss secondary to #1 and #2 4. 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 He required red cell transfusions following cycle 3 and cycle 4 etoposide/carboplatin, last transfusion 02/09/2014 5. History of thrombocytopenia secondary to chemotherapy. 6. Hospitalization 10/20/2013 following a fall   Past Medical History  Diagnosis Date  . Dyslipidemia   . Hypertension   . History of colonic polyps   . Thrombocytopenia   . Nonproliferative diabetic retinopathy NOS(362.03)   . Impotence of organic origin   .  Unspecified hereditary and idiopathic peripheral neuropathy   . Diabetes mellitus   . Depressive disorder, not elsewhere classified   . Coronary artery disease     a. s/p CABG 1997; b. LHC 04/2007: Distal left main 80% treated with PCI (Cypher DES), LIMA-LAD patent, SVG-OM patent, SVG-PDA patent, EF 30% with inferior HK  . Chronic systolic heart failure     a. Echo 09/2011: EF 30-35%, apical AK, basal inferior AK, mild AI, moderate MR, moderate LAE  . Anxiety state, unspecified   . Ischemic cardiomyopathy   . S/P ICD (internal cardiac defibrillator) procedure   . Esophageal cancer   . Elevated PSA   . History of radiation therapy 08/06/13-09/12/13    esophageal  . Esophageal stricture      Past Surgical History  Procedure Laterality Date  . Coronary artery bypass graft      X 3  . Cardiac catheterization  05/23/07  . Insert / replace / remove pacemaker  07/05/07    [employer] SINGLE-CHAMBER DEFIBRILLATOR, Billy Bunting, MD  . Esophagogastroduodenoscopy N/A 07/08/2013    Procedure: ESOPHAGOGASTRODUODENOSCOPY (EGD) with possible Dilation.;  Surgeon: Billy Meckel, MD;  Location: [facility];  Service: Endoscopy;  Laterality: N/A;  . Esophagogastroduodenoscopy N/A 04/09/2014    Procedure: ESOPHAGOGASTRODUODENOSCOPY (EGD);  Surgeon: Billy Meckel, MD;  Location: [facility];  Service: Endoscopy;  Laterality: N/A;  Possible Dil  . Balloon dilation N/A 04/09/2014    Procedure: BALLOON DILATION;  Surgeon: Billy Meckel, MD;  Location: [facility];  Service: Endoscopy;  Laterality: N/A;  . Esophageal dilation       MEDICATIONS:  Scheduled Meds: . aspirin EC  81 mg Oral Daily  . atorvastatin  80 mg Oral QHS  . carvedilol  6.25 mg Oral BID WC  . citalopram  10 mg Oral Daily  . clopidogrel  75 mg Oral Daily  . docusate sodium  100 mg Oral BID  . folic acid  1 mg Oral Daily  . heparin  5,000 Units Subcutaneous 3 times per day  . insulin aspart  0-5 Units Subcutaneous QHS  . insulin  aspart  0-9 Units Subcutaneous TID WC  . insulin glargine  10 Units Subcutaneous Daily  . morphine  15 mg Oral Q12H  . multivitamin with minerals  1 tablet Oral Daily  . sodium chloride  3 mL Intravenous Q12H  . thiamine  100 mg Oral Daily   Continuous Infusions:  PRN Meds:.acetaminophen, acetaminophen, alum & mag hydroxide-simeth, bisacodyl, nitroGLYCERIN, ondansetron (ZOFRAN) IV, ondansetron, oxyCODONE, sodium chloride  ALLERGIES:  Allergies  Allergen Reactions  . Ramipril Other (See Comments)    cough    History reviewed. No pertinent family history.    History   Social History  . Marital Status: Married    Spouse Name: N/A    Number of Children: 4  . Years of Education: N/A   Occupational History  . PASTOR    Social History Main Topics  . Smoking status: Former Smoker -- 2 years    Types: Cigarettes    Quit date: 11/27/1994  . Smokeless tobacco: Never Used  . Alcohol Use: No  . Drug Use: No  . Sexual Activity: Not on file   Other Topics Concern  . Not on file   Social History Narrative   [employer]   MARRIED   4 BOYS   FORMER SMOKER, QUIT 14 YRS   ALCOHOL USE -NO   NO ILLICIT DRUG USE         ICD-[employer]      PATIENT SIGNED A DESIGNATED PARTY RELEASE TO ALLOW HIS WIFE, Billy Bush, TO HAVE ACCESS TO HIS MEDICAL RECORDS/INFORMATION.    Billy Bush, March 21, 2010 9:42 AM     PHYSICAL EXAMINATION:   Filed Vitals:   04/28/14 0500  BP: 98/69  Pulse: 137  Temp: 98.1 F (36.7 C)  Resp: 18   Filed Weights   04/24/14 1946  Weight: 151 lb (68.493 kg)   Physical examination: Not performed today  LABORATORY/RADIOLOGY DATA:   Recent Labs Lab 04/24/14 2001 04/25/14 0445 04/26/14 0442 04/27/14 0432 04/28/14 0405  WBC 11.5* 8.3 8.4 8.1 7.7  HGB 9.1* 7.8* 7.8* 7.5* 9.3*  HCT 26.7* 23.5* 23.2* 22.2* 27.6*  PLT 196 139* 141* 129* 114*  MCV 87.8 89.7 90.6 91.0 89.9  MCH 29.9 29.8 30.5 30.7 30.3  MCHC 34.1 33.2 33.6 33.8 33.7  RDW  13.7 13.7 14.0 14.0 14.4    CMP    Recent Labs Lab 04/24/14 2001 04/25/14 0445 04/26/14 0442 04/27/14 0432 04/28/14 0405  NA 124* 125* 131* 132* 133*  K 4.7 4.3 4.3 3.7 3.5*  CL 85* 90* 96 98 97  CO2 23 20 22 22 24   GLUCOSE 167* 172* 260* 170* 170*  BUN 42* 35* 23 14 11   CREATININE 2.43* 1.81* 1.24 1.01 1.02  CALCIUM 9.4 8.4 8.7 8.9 9.1  AST 60* 48*  --   --   --   ALT 18 14  --   --   --   ALKPHOS 209* 168*  --   --   --   BILITOT 0.9 0.7  --   --   --         Component Value Date/Time   BILITOT 0.7 04/25/2014 0445   BILITOT 0.84 02/09/2014 0818   BILIDIR 0.2  01/03/2013 0859        Recent Labs Lab 04/25/14 0445  INR 1.24      Urinalysis    Component Value Date/Time   COLORURINE YELLOW 03/16/2014 1547   APPEARANCEUR CLEAR 03/16/2014 1547   LABSPEC 1.015 03/16/2014 1547   PHURINE 6.0 03/16/2014 1547   GLUCOSEU NEGATIVE 03/16/2014 1547   HGBUR NEGATIVE 03/16/2014 1547   BILIRUBINUR NEGATIVE 03/16/2014 1547   KETONESUR NEGATIVE 03/16/2014 1547   UROBILINOGEN 1.0 03/16/2014 1547   NITRITE NEGATIVE 03/16/2014 1547   LEUKOCYTESUR NEGATIVE 03/16/2014 1547     Liver Function Tests:  Recent Labs Lab 04/24/14 2001 04/25/14 0445  AST 60* 48*  ALT 18 14  ALKPHOS 209* 168*  BILITOT 0.9 0.7  PROT 6.8 5.5*  ALBUMIN 3.1* 2.4*    Radiology Studies:  Dg Chest 2 View  04/25/2014   CLINICAL DATA:  Follow-up pulmonary nodule  EXAM: CHEST  2 VIEW  COMPARISON:  04/24/2014  FINDINGS: Left apical rounded opacity again noted measuring approximately 3 cm. There is no focal parenchymal opacity, pleural effusion, or pneumothorax. Stable cardiomediastinal silhouette. Prior CABG. Single lead cardiac pacer again noted.  The osseous structures are unremarkable.  IMPRESSION: Left apical rounded opacity again noted measuring approximately 3 cm. This may reflect round pneumonia versus abnormality within the chest wall soft tissues. The appearance is unlikely to reflect malignancy given  the significant interval enlargement compared with 02/09/2014. Further evaluation with chest CT is recommended.   Electronically Signed   By: Kathreen Devoid   On: 04/25/2014 16:48   Dg Esophagus  04/27/2014   CLINICAL DATA:  Esophageal cancer.  Radiation therapy .  EXAM: ESOPHOGRAM/BARIUM SWALLOW  TECHNIQUE: Single contrast examination was performed using  thin barium .  FLUOROSCOPY TIME:  0 min 39 seconds.  COMPARISON:  CT 03/16/2014.  FINDINGS: This a slightly limited exam due to patient's condition. The upper and mid thoracic esophagus were widely patent. Narrowing of the distal esophagus is present. This appeared relatively fixed. Differential diagnosis includes of malignancy and or scarring. Small sliding hiatal hernia is present.  IMPRESSION: Moderate fixed narrowing of the distal esophagus consistent with recurrent tumor and/or scarring.   Electronically Signed   By: Marcello Moores  Register   On: 04/27/2014 12:07   US Renal  04/26/2014   CLINICAL DATA:  Acute renal failure, elevated creatinine, history of esophageal cancer, diabetes and hypertension  EXAM: RENAL/URINARY TRACT ULTRASOUND COMPLETE  COMPARISON:  CT abdomen pelvis - 02/09/2014  FINDINGS: Right Kidney:  Normal cortical thickness, echogenicity and size, measuring 11.9 cm in length. No focal renal lesions. No echogenic renal stones. No urinary obstruction.  Left Kidney:  Normal cortical thickness, echogenicity and size, measuring 12.1 cm in length. No focal renal lesions. No echogenic renal stones. No urinary obstruction.  Bladder:  Appears normal for degree of bladder distention.  Other: Note is made of multiple mixed echogenic solid lesions within the incidentally imaged right lobe of the liver with dominant mass measuring approximately 4.2 x 3.2 cm (image 13) with additional lesion measuring approximately 2.4 x 2.0 cm (also seen on image 13).  IMPRESSION: 1. No explanation for patient's acute renal failure. Specifically, no evidence of urinary  obstruction. 2. Interval development of multiple liver lesions within the incidentally imaged right lobe of the liver. While several smaller hyperenhancing liver lesions were noted on prior abdominal CT performed 02/09/2014, these lesions appear new and larger and given history of esophageal cancer are worrisome for metastatic disease. Further evaluation with abdominal  CT scan and/or dedicated abdominal ultrasound as clinically indicated.   Electronically Signed   By: Sandi Mariscal M.D.   On: 04/26/2014 08:47   Dg Chest Port 1 View  04/24/2014   CLINICAL DATA:  73 year old male with hypotension. Pain. Initial encounter.  EXAM: PORTABLE CHEST - 1 VIEW  COMPARISON:  03/14/2014 and earlier.  FINDINGS: Portable AP upright view at 2017 hrs. New since last month is indistinct 3 cm nodular opacity in the left lung apex. There seems to be some additional associated extra pulmonary density near the left shoulder (horizontal arrow).  Otherwise improved lung base ventilation. Lung parenchyma elsewhere appears stable with no pneumothorax or effusion. Stable cardiomegaly and mediastinal contours. Stable left chest cardiac AICD. Visualized tracheal air column is within normal limits.  IMPRESSION: 1. A left upper lobe 3 cm pulmonary nodule is new since last month, suggesting either infectious etiology or artifact. PA and lateral chest radiographs would be valuable when possible. Alternatively, chest CT (IV contrast preferred) could be utilized. 2. Improved bibasilar ventilation since April. No other new cardiopulmonary abnormality identified.   Electronically Signed   By: Lars Pinks M.D.   On: 04/24/2014 20:30       ASSESSMENT AND PLAN:  1. High-grade neuroendocrine carcinoma of the esophagus, s/p 4 cycles of  Chemotherapy with carboplatin and etoposide, first cycle on 10/06/13. Last chemo give on 01/18/14. He received radiation to the esophagus from Sept to October of 2014.   CT of the chest 04/27/2014 consistent with  progressive metastatic disease involving the pleura, liver, chest lymph nodes, and bones  2. Dysphagia  Secondary to #1. Esophageal stricture noted on an upper endoscopy 04/09/2014 and a barium swallow 04/27/2014  3. Anemia Secondary to #1. Possible slow GI bleed from friable malignant tissue in esophagus, he may have bone marrow involvement with tumor, status post a red cell transfusion 04/27/2014-improved   4. Thrombocytopenia   5. pain-likely secondary to pleural and bone metastases  6. hoarseness-likely related to recurrent laryngeal nerve involvement with tumor  7. admission with dehydration  8. History of coronary artery disease and cardiomyopathy  9. diabetes      **Disclaimer: This note was dictated with voice recognition software. Similar sounding words can inadvertently be transcribed and this note may contain transcription errors which may not have been corrected upon publication of note.Ladell Pier, PA-C 04/28/2014, 1:33 PM  Billy Bush appears to have progressive metastatic neuroendocrine carcinoma the esophagus. I reviewed the CT findings with Billy Bush and his family. We discussed treatment options. The chance of clinical improvement with further chemotherapy is small and his current performance status is poor. I recommend a comfort/supportive care. I recommend a [facility] referral. Recommendations: 1. continue narcotic analgesics for pain 2. diet recommendation per GI, consider speech pathology swallowing evaluation 3. [facility] referral if patient/family agree  Billy Bush would like to discuss the hospice option with his family. I will talk with them later today or in the morning on 04/29/2014. I will be glad to follow him with the [facility] at discharge. We will arrange for outpatient followup.

## 2014-04-29 ENCOUNTER — Encounter (HOSPITAL_COMMUNITY): Payer: Self-pay | Admitting: Cardiology

## 2014-04-29 DIAGNOSIS — D509 Iron deficiency anemia, unspecified: Secondary | ICD-10-CM

## 2014-04-29 DIAGNOSIS — I2510 Atherosclerotic heart disease of native coronary artery without angina pectoris: Secondary | ICD-10-CM

## 2014-04-29 DIAGNOSIS — F411 Generalized anxiety disorder: Secondary | ICD-10-CM

## 2014-04-29 DIAGNOSIS — I447 Left bundle-branch block, unspecified: Secondary | ICD-10-CM

## 2014-04-29 DIAGNOSIS — Z95810 Presence of automatic (implantable) cardiac defibrillator: Secondary | ICD-10-CM

## 2014-04-29 DIAGNOSIS — I080 Rheumatic disorders of both mitral and aortic valves: Secondary | ICD-10-CM

## 2014-04-29 DIAGNOSIS — I4891 Unspecified atrial fibrillation: Secondary | ICD-10-CM

## 2014-04-29 LAB — CBC
HCT: 28.6 % — ABNORMAL LOW (ref 39.0–52.0)
Hemoglobin: 9.7 g/dL — ABNORMAL LOW (ref 13.0–17.0)
MCH: 30.8 pg (ref 26.0–34.0)
MCHC: 33.9 g/dL (ref 30.0–36.0)
MCV: 90.8 fL (ref 78.0–100.0)
Platelets: 123 10*3/uL — ABNORMAL LOW (ref 150–400)
RBC: 3.15 MIL/uL — AB (ref 4.22–5.81)
RDW: 14.8 % (ref 11.5–15.5)
WBC: 7.6 10*3/uL (ref 4.0–10.5)

## 2014-04-29 LAB — GLUCOSE, CAPILLARY
GLUCOSE-CAPILLARY: 144 mg/dL — AB (ref 70–99)
GLUCOSE-CAPILLARY: 122 mg/dL — AB (ref 70–99)
Glucose-Capillary: 144 mg/dL — ABNORMAL HIGH (ref 70–99)
Glucose-Capillary: 123 mg/dL — ABNORMAL HIGH (ref 70–99)

## 2014-04-29 MED ORDER — AMIODARONE HCL IN DEXTROSE 360-4.14 MG/200ML-% IV SOLN
30.0000 mg/h | INTRAVENOUS | Status: DC
  Administered 2014-04-30: 30 mg/h via INTRAVENOUS
  Filled 2014-04-29 (×5): qty 200

## 2014-04-29 MED ORDER — SODIUM CHLORIDE 0.9 % IV BOLUS (SEPSIS): 500.0000 mL | Freq: Once | INTRAVENOUS | Status: DC

## 2014-04-29 MED ORDER — DIGOXIN 0.25 MG/ML IJ SOLN
0.5000 mg | Freq: Once | INTRAMUSCULAR | Status: AC
  Administered 2014-04-29: 0.5 mg via INTRAVENOUS
  Filled 2014-04-29: qty 2

## 2014-04-29 MED ORDER — DIGOXIN 0.25 MG/ML IJ SOLN
0.2500 mg | Freq: Once | INTRAMUSCULAR | Status: AC
  Administered 2014-04-29: 0.25 mg via INTRAVENOUS
  Filled 2014-04-29 (×2): qty 1

## 2014-04-29 MED ORDER — AMIODARONE HCL 200 MG PO TABS
200.0000 mg | ORAL_TABLET | Freq: Two times a day (BID) | ORAL | Status: DC
  Administered 2014-04-30 (×2): 200 mg via ORAL
  Filled 2014-04-29 (×4): qty 1

## 2014-04-29 MED ORDER — CLOPIDOGREL BISULFATE 75 MG PO TABS
75.0000 mg | ORAL_TABLET | Freq: Every day | ORAL | Status: DC
  Administered 2014-04-29 – 2014-05-01 (×3): 75 mg via ORAL
  Filled 2014-04-29 (×3): qty 1

## 2014-04-29 MED ORDER — AMIODARONE HCL IN DEXTROSE 360-4.14 MG/200ML-% IV SOLN
60.0000 mg/h | INTRAVENOUS | Status: AC
  Administered 2014-04-29 (×2): 60 mg/h via INTRAVENOUS
  Filled 2014-04-29 (×2): qty 200

## 2014-04-29 MED ORDER — SODIUM CHLORIDE 0.9 % IV SOLN
INTRAVENOUS | Status: AC
  Administered 2014-04-29: 09:00:00 via INTRAVENOUS

## 2014-04-29 MED ORDER — SODIUM CHLORIDE 0.9 % IV SOLN
INTRAVENOUS | Status: AC
  Administered 2014-04-29 (×2): via INTRAVENOUS

## 2014-04-29 MED ORDER — SODIUM CHLORIDE 0.9 % IV BOLUS (SEPSIS)
250.0000 mL | Freq: Once | INTRAVENOUS | Status: AC
  Administered 2014-04-29: 250 mL via INTRAVENOUS

## 2014-04-29 MED ORDER — AMIODARONE LOAD VIA INFUSION
150.0000 mg | Freq: Once | INTRAVENOUS | Status: AC
  Administered 2014-04-29: 150 mg via INTRAVENOUS
  Filled 2014-04-29: qty 83.34

## 2014-04-29 MED ORDER — AMIODARONE HCL 200 MG PO TABS: 200.0000 mg | ORAL_TABLET | Freq: Every day | ORAL | Status: DC

## 2014-04-29 NOTE — Consult Note (Signed)
Reason for Consult: PAF with RVR   Referring Physician: Dr. Tana Coast  PCP: Renato Shin, MD Primary Cardiologist:Dr. Recardo Evangelist Billy Bush is an 73 y.o. male.    Chief Complaint:  Admitted 04/24/14  HPI:  Billy Bush is a 73 y.o. male with a history of esophageal carcinoma who presented to the ED with weakness. He stated that he was unable to get and stay up on his feet. Patient has been diagnosed with esophageal carcinoma about a year ago, He has received chemo and radiation earlier this year. As a consequence he developed esophageal strictures. He recently underwent a dilatation about 2 weeks ago. Patient stated that he has had decreased oral intake. He has been taking all his blood pressure medications and also his diuretics however. He denies having any chest pain. He has not had any syncope. He has no edema noted. He has no headaches noted. He does admit to having some right flank pain and also has had some right shoulder blade pain.  He was hyponatremic on admit and diuretics held, IV fluids given.  He was hypotensive.  Today he developed A Fib with RVR at 135.   He has cardiac history of CAD s/p CABG with a left internal mammary artery to the left anterior descending, vein graft to the obtuse marginal, vein graft to the posterior descending artery in July 1997.  Last cath 2008 Cypher DES to left main in 2008. ByPass grafts were patent then.   History of severe mitral regurgitation status post 80 [place] ring  July 1997.  He has ICM with [employer] Jude ICD.    Last echo was in 2/15 and showed EF 25-30% with moderate MR and TR.  He was off Plavix for Chemo now restarted with anemia and thrombocytopenia. I find no record for atrial fib in the past.  Since admit Barium esophagram confirms smooth distal es. Stricture, likely malignant, normal esophagus proximal to the stricture. I suspect distal esophageal mass encircling the distal lumen. Consider re dilation vs es. Stent place,emy   Though ate BK today so GI felt she could be discharged- chest CT 04/27/2014 consistent with progressive metastatic disease involving the pleura, liver, chest lymph nodes, and bones. Anemia secondary to esophageal cancer-friable tissue.  Hospice care recommended.    Pt was to be discharged today and developed A fib with RVR, his coreg was held this AM due to hypotension. Lanoxin 0.25 mg IV given X1 another dose has been ordered.  BP remains low limiting management along with anemia recently this admit 7.5 and transfused.  No chest pain, no awareness of atrial fib.  No SOB.   Past Medical History  Diagnosis Date  . Dyslipidemia   . Hypertension   . History of colonic polyps   . Thrombocytopenia   . Nonproliferative diabetic retinopathy NOS(362.03)   . Impotence of organic origin   . Iron deficiency anemia, unspecified   . Unspecified hereditary and idiopathic peripheral neuropathy   . Diabetes mellitus   . Depressive disorder, not elsewhere classified   . Coronary artery disease     a. s/p CABG 1997; b. LHC 04/2007: Distal left main 80% treated with PCI (Cypher DES), LIMA-LAD patent, SVG-OM patent, SVG-PDA patent, EF 30% with inferior HK  . Chronic systolic heart failure     a. Echo 09/2011: EF 30-35%, apical AK, basal inferior AK, mild AI, moderate MR, moderate LAE  . Anxiety state, unspecified   . Ischemic  cardiomyopathy   . S/P ICD (internal cardiac defibrillator) procedure   . Esophageal cancer   . Elevated PSA   . History of radiation therapy 08/06/13-09/12/13    esophageal  . Esophageal stricture     Past Surgical History  Procedure Laterality Date  . Coronary artery bypass graft      X 3  . Cardiac catheterization  05/23/07  . Insert / replace / remove pacemaker  07/05/07    [employer] SINGLE-CHAMBER DEFIBRILLATOR, [name], MD  . Esophagogastroduodenoscopy N/A 07/08/2013    Procedure: ESOPHAGOGASTRODUODENOSCOPY (EGD) with possible Dilation.;  Surgeon: Louis Meckel, MD;   Location: [facility];  Service: Endoscopy;  Laterality: N/A;  . Esophagogastroduodenoscopy N/A 04/09/2014    Procedure: ESOPHAGOGASTRODUODENOSCOPY (EGD);  Surgeon: Louis Meckel, MD;  Location: [facility];  Service: Endoscopy;  Laterality: N/A;  Possible Dil  . Balloon dilation N/A 04/09/2014    Procedure: BALLOON DILATION;  Surgeon: Louis Meckel, MD;  Location: [facility];  Service: Endoscopy;  Laterality: N/A;  . Esophageal dilation      History reviewed. No pertinent family history.  No premature family hx of CAD.  Social History:  reports that he quit smoking about 19 years ago. His smoking use included Cigarettes. He smoked 0.00 packs per day for 2 years. He has never used smokeless tobacco. He reports that he does not drink alcohol or use illicit drugs.  Allergies:  Allergies  Allergen Reactions  . Ramipril Other (See Comments)    cough    Medications Prior to Admission  Medication Sig Dispense Refill  . acetaminophen (TYLENOL) 325 MG tablet Take 325 mg by mouth every 6 (six) hours as needed for moderate pain.       Marland Kitchen aspirin EC 81 MG tablet Take 1 tablet (81 mg total) by mouth daily.      Marland Kitchen atorvastatin (LIPITOR) 80 MG tablet Take 80 mg by mouth at bedtime.      . carvedilol (COREG) 12.5 MG tablet Take 1 tablet (12.5 mg total) by mouth 2 (two) times daily with a meal.  180 tablet  1  . citalopram (CELEXA) 10 MG tablet Take 1 tablet (10 mg total) by mouth daily.  90 tablet  prn  . clopidogrel (PLAVIX) 75 MG tablet Take 1 tablet (75 mg total) by mouth daily.  90 tablet  1  . co-enzyme Q-10 30 MG capsule Take 30 mg by mouth daily.        . furosemide (LASIX) 80 MG tablet Take 40 mg by mouth 2 (two) times daily.       Marland Kitchen HYDROcodone-acetaminophen (NORCO) 10-325 MG per tablet Take 1 tablet by mouth every 4 (four) hours as needed.  120 tablet  0  . insulin lispro (HUMALOG) 100 UNIT/ML injection Inject into the skin 3x a day (just before each meal) 04-30-13 units      . losartan  (COZAAR) 50 MG tablet Take 25 mg by mouth daily.      . Multiple Vitamin (MULTIVITAMIN) tablet Take 1 tablet by mouth daily.        . nitroGLYCERIN (NITROSTAT) 0.4 MG SL tablet Place 1 tablet (0.4 mg total) under the tongue every 5 (five) minutes as needed for chest pain.  25 tablet  3  . ondansetron (ZOFRAN) 4 MG tablet Take 4 mg by mouth 3 (three) times daily before meals. 30 min prior to meals      . oxyCODONE-acetaminophen (PERCOCET) 10-325 MG per tablet Take 1 tablet by mouth  every 4 (four) hours as needed for pain.  100 tablet  0  . sodium chloride (OCEAN) 0.65 % SOLN nasal spray Place 1 spray into both nostrils as needed for congestion.      Marland Kitchen spironolactone (ALDACTONE) 25 MG tablet Take 25 mg by mouth daily.      Marland Kitchen glucose blood (ONE TOUCH ULTRA TEST) test strip Use as directed twice daily dx 250.01  200 each  0  . Insulin Pen Needle (BD PEN NEEDLE NANO U/F) 32G X 4 MM MISC Use as directed four times daily  400 each  3    Results for orders placed during the hospital encounter of 04/24/14 (from the past 48 hour(s))  PREPARE RBC (CROSSMATCH)     Status: None   Collection Time    04/27/14  3:30 PM      Result Value Ref Range   Order Confirmation ORDER PROCESSED BY BLOOD BANK    GLUCOSE, CAPILLARY     Status: Abnormal   Collection Time    04/27/14  6:13 PM      Result Value Ref Range   Glucose-Capillary 162 (*) 70 - 99 mg/dL  GLUCOSE, CAPILLARY     Status: Abnormal   Collection Time    04/27/14  8:59 PM      Result Value Ref Range   Glucose-Capillary 167 (*) 70 - 99 mg/dL  CBC     Status: Abnormal   Collection Time    04/28/14  4:05 AM      Result Value Ref Range   WBC 7.7  4.0 - 10.5 K/uL   RBC 3.07 (*) 4.22 - 5.81 MIL/uL   Hemoglobin 9.3 (*) 13.0 - 17.0 g/dL   Comment: DELTA CHECK NOTED     REPEATED TO VERIFY   HCT 27.6 (*) 39.0 - 52.0 %   MCV 89.9  78.0 - 100.0 fL   MCH 30.3  26.0 - 34.0 pg   MCHC 33.7  30.0 - 36.0 g/dL   RDW 14.4  11.5 - 15.5 %   Platelets 114 (*)  150 - 400 K/uL   Comment: SPECIMEN CHECKED FOR CLOTS     REPEATED TO VERIFY     PLATELET COUNT CONFIRMED BY SMEAR  BASIC METABOLIC PANEL     Status: Abnormal   Collection Time    04/28/14  4:05 AM      Result Value Ref Range   Sodium 133 (*) 137 - 147 mEq/L   Potassium 3.5 (*) 3.7 - 5.3 mEq/L   Chloride 97  96 - 112 mEq/L   CO2 24  19 - 32 mEq/L   Glucose, Bld 170 (*) 70 - 99 mg/dL   BUN 11  6 - 23 mg/dL   Creatinine, Ser 1.02  0.50 - 1.35 mg/dL   Calcium 9.1  8.4 - 10.5 mg/dL   GFR calc non Af Amer 72 (*) >90 mL/min   GFR calc Af Amer 84 (*) >90 mL/min   Comment: (NOTE)     The eGFR has been calculated using the CKD EPI equation.     This calculation has not been validated in all clinical situations.     eGFR's persistently <90 mL/min signify possible Chronic Kidney     Disease.  GLUCOSE, CAPILLARY     Status: Abnormal   Collection Time    04/28/14  7:46 AM      Result Value Ref Range   Glucose-Capillary 178 (*) 70 - 99 mg/dL  GLUCOSE, CAPILLARY  Status: Abnormal   Collection Time    04/28/14 12:03 PM      Result Value Ref Range   Glucose-Capillary 158 (*) 70 - 99 mg/dL  GLUCOSE, CAPILLARY     Status: Abnormal   Collection Time    04/28/14  5:09 PM      Result Value Ref Range   Glucose-Capillary 134 (*) 70 - 99 mg/dL   Comment 1 Notify RN    GLUCOSE, CAPILLARY     Status: Abnormal   Collection Time    04/28/14 10:47 PM      Result Value Ref Range   Glucose-Capillary 140 (*) 70 - 99 mg/dL   Comment 1 Documented in Chart     Comment 2 Notify RN    CBC     Status: Abnormal   Collection Time    04/29/14  3:33 AM      Result Value Ref Range   WBC 7.6  4.0 - 10.5 K/uL   RBC 3.15 (*) 4.22 - 5.81 MIL/uL   Hemoglobin 9.7 (*) 13.0 - 17.0 g/dL   HCT 28.6 (*) 39.0 - 52.0 %   MCV 90.8  78.0 - 100.0 fL   MCH 30.8  26.0 - 34.0 pg   MCHC 33.9  30.0 - 36.0 g/dL   RDW 14.8  11.5 - 15.5 %   Platelets 123 (*) 150 - 400 K/uL  GLUCOSE, CAPILLARY     Status: Abnormal    Collection Time    04/29/14  7:46 AM      Result Value Ref Range   Glucose-Capillary 144 (*) 70 - 99 mg/dL  GLUCOSE, CAPILLARY     Status: Abnormal   Collection Time    04/29/14 12:06 PM      Result Value Ref Range   Glucose-Capillary 144 (*) 70 - 99 mg/dL   Ct Chest Wo Contrast  04/27/2014   CLINICAL DATA:  History of esophageal cancer. Back pain. Currently undergoing chemotherapy and radiation therapy.  EXAM: CT CHEST WITHOUT CONTRAST  TECHNIQUE: Multidetector CT imaging of the chest was performed following the standard protocol without IV contrast.  COMPARISON:  CT of the chest, abdomen and pelvis 02/09/2014.  FINDINGS: Mediastinum: There are 2 masslike areas in the mediastinum, 1 in the mediastinum immediately posterior to the carina (image 30 of series 2) measuring 4.8 x 3.3 cm, which either arises from the esophagus or is immediately adjacent to the esophagus (favored, likely an enlarged lymph node) exerting significant mass effect upon the esophagus. There is also a 3.2 x 2.7 cm mass in the superior mediastinum to the left side of the trachea and esophagus exerting mass effect upon all adjacent structures (image 13 of series 2). In the distal esophagus immediately above the gastroesophageal junction there is also masslike enlargement which distorts the small amount of oral contrast material in the lumen, best appreciated on image 50 of series 2. Heart size is mildly enlarged. There is no significant pericardial fluid, thickening or pericardial calcification. There is atherosclerosis of the thoracic aorta, the great vessels of the mediastinum and the coronary arteries, including calcified atherosclerotic plaque in the left main, left anterior descending, left circumflex and right coronary arteries. Stent in left main coronary artery and likely in the mid to distal right coronary artery. Status post median sternotomy for CABG, including LIMA to LAD. Left-sided pacemaker device in position with lead  tip terminating in the right ventricular free wall. Dilatation of the pulmonic trunk (4.5 cm in diameter), suggesting underlying  pulmonary arterial hypertension.  Lungs/Pleura: Moderate bilateral pleural effusions layer dependently and are simple in appearance on today's non contrast CT examination. These are associated with extensive passive atelectasis throughout the lower lobes of the lungs bilaterally. There are a few patchy micronodules in the periphery of the lungs bilaterally which are nonspecific and favored to be infectious or inflammatory. However, there is also a large pleural-based nodule in the anterior aspect of the left upper lobe best appreciated on image 14 of series 2 measuring approximately 2.9 x 2.9 cm, with apparent direct chest wall invasion between the left first and second ribs. Multiple areas of new nodular pleural thickening are noted in the lateral aspect of the right hemithorax, best appreciated on image 22 and image 31 of series 2, suspicious for pleural metastases.  Upper Abdomen: Small volume of perihepatic ascites. Several ill-defined low-attenuation hepatic lesions are noted on today's examination, poorly evaluated on this noncontrast CT study, however, the largest of these is estimated to measure approximately 2.6 x 2.2 cm (image 59 of series 2), and these lesions appear either new or enlarged compared to the prior examination from 02/09/2014, highly concerning for metastasis.  Musculoskeletal: Multiples slightly ill-defined mixed lytic and sclerotic areas are noted throughout the visualized axial and appendicular skeleton, suspicious for multifocal metastasis. Specific examples include the lateral aspect of the right fourth rib, lateral aspect of the right sixth rib, lateral aspect of the left fifth rib, as well as multiple small lesions throughout the thoracic spine.  IMPRESSION: 1. Today's study demonstrates progression of disease in the thorax and upper abdomen, with enlargement  of apparent esophageal mass immediately above the gastroesophageal junction, progression of mediastinal adenopathy, new pulmonary and pleural metastases, presumably malignant bilateral pleural effusions than or moderate in size, multiple new and enlarging liver lesions, and multiple skeletal lesions, as detailed above. 2. Additional incidental findings, as above.   Electronically Signed   By: Vinnie Langton M.D.   On: 04/27/2014 17:37    ROS: General:no colds or fevers, decrease of weight Skin:no rashes or ulcers HEENT:no blurred vision, no congestion CV:see HPI PUL:see HPI GI:no diarrhea constipation or melena, no indigestion GU:no hematuria, no dysuria MS:no joint pain, no claudication, + back pain now controlled Neuro:no syncope, no lightheadedness Endo:+ diabetes, no thyroid disease   Blood pressure 86/54, pulse 130, temperature 98.2 F (36.8 C), temperature source Oral, resp. rate 20, height $RemoveBe'5\' 7"'VpKEMAflt$  (1.702 m), weight 151 lb (68.493 kg), SpO2 92.00%. PE: General:Pleasant affect, NAD Skin:Warm and dry, brisk capillary refill HEENT:normocephalic, sclera clear, mucus membranes moist Neck:supple, no JVD, no bruits  Heart:irreg irreg with 2/6 systolic murmur, rapid, no gallup, rub or click Lungs:clear but diminished without rales, rhonchi, or wheezes DQQ:IWLN, non tender, + BS, do not palpate liver spleen or masses Ext:no lower ext edema, 2+ pedal pulses, 2+ radial pulses Neuro:alert and oriented, MAE, follows commands, + facial symmetry    Assessment/Plan Principal Problem:   Atrial fibrillation with RVR, coreg has been held due to hypotension, Dig added HR continues to be elevated.  Not a candidate for anticoagulation due to high risk for bleed.  Transfused this admit.  Friable esophagus.  ? Add amiodarone for rate control.  Would continue ASA and Plavix for now.  Active Problems:   DIABETES MELLITUS, TYPE I   Other and unspecified hyperlipidemia   ANEMIA, IRON DEFICIENCY    THROMBOCYTOPENIA   HYPERTENSION   CORONARY ARTERY DISEASE   SYSTOLIC HEART FAILURE, CHRONIC- monitor IV fluid with EF 20-25%  Hypotension   Hyponatremia   Dehydration   Acute renal failure   Protein-calorie malnutrition, severe    Cecilie Kicks  Nurse Practitioner Certified [place] Pager [phone] or after 5pm or weekends call [phone] 04/29/2014, 2:08 PM     I have seen and examined the patient along with Cecilie Kicks, NP.  I have reviewed the chart, notes and new data.  I agree with PA/NP's note.  Key new complaints: pain is now well controlled Key examination changes: no clinical HF, HR 125-130 at rest, 140-150 just sitting up in bed; BP is low Key new findings / data: Hgb 9.7, stable  PLAN: Persistent RVR despite current rate control meds. AV nodal blockade limited by hypotension. Start IV amiodarone for rate (possibly even rhythm) control. Rates are not fast enough to risk ICD inappropriate therapy at this point. Anticoagulation is not an option due to GI bleeding requiring transfusion. I even wonder whether we should consider stopping clopidogrel (his stent is 71 years old). In view of recurrent and rapidly spreading esophageal cancer, need to discuss turning off tachyarrhythmia therapies. I did not broach that topic since he had extended family, including young grandchildren in the room.   Sanda Klein, MD, [place] [phone] 04/29/2014, 6:11 PM

## 2014-04-29 NOTE — Progress Notes (Signed)
  Amiodarone Drug - Drug Interaction Consult Note  Recommendations: Patient on atorvastatin 80mg  daily and carvedilol 6.25mg  . Patient should be counseled to report muscle pain or weakness. Patient should be monitored for bradycardia.  Amiodarone is metabolized by the cytochrome P450 system and therefore has the potential to cause many drug interactions. Amiodarone has an average plasma half-life of 50 days (range 20 to 100 days).   There is potential for drug interactions to occur several weeks or months after stopping treatment and the onset of drug interactions may be slow after initiating amiodarone.   [x]  Statins: Increased risk of myopathy. Simvastatin- restrict dose to 20mg  daily. Other statins: counsel patients to report any muscle pain or weakness immediately.  []  Anticoagulants: Amiodarone can increase anticoagulant effect. Consider warfarin dose reduction. Patients should be monitored closely and the dose of anticoagulant altered accordingly, remembering that amiodarone levels take several weeks to stabilize.   []  Antiepileptics: Amiodarone can increase plasma concentration of phenytoin, the dose should be reduced. Note that small changes in phenytoin dose can result in large changes in levels. Monitor patient and counsel on signs of toxicity.  [x]  Beta blockers: increased risk of bradycardia, AV block and myocardial depression. Sotalol - avoid concomitant use.  []   Calcium channel blockers (diltiazem and verapamil): increased risk of bradycardia, AV block and myocardial depression.  []   Cyclosporine: Amiodarone increases levels of cyclosporine. Reduced dose of cyclosporine is recommended.  []  Digoxin dose should be halved when amiodarone is started.  []  Diuretics: increased risk of cardiotoxicity if hypokalemia occurs.  []  Oral hypoglycemic agents (glyburide, glipizide, glimepiride): increased risk of hypoglycemia. Patient's glucose levels should be monitored closely when  initiating amiodarone therapy.   []  Drugs that prolong the QT interval:  Torsades de pointes risk may be increased with concurrent use - avoid if possible.  Monitor QTc, also keep magnesium/potassium WNL if concurrent therapy can't be avoided. Marland Kitchen Antibiotics: e.g. fluoroquinolones, erythromycin. . Antiarrhythmics: e.g. quinidine, procainamide, disopyramide, sotalol. . Antipsychotics: e.g. phenothiazines, haloperidol.  . Lithium, tricyclic antidepressants, and methadone.   Thank Dennis Bast  Mosetta Pigeon  04/29/2014 6:43 PM

## 2014-04-29 NOTE — Progress Notes (Signed)
Patient ID: Billy Bush  male  ZOX:[id number]    DOB: Feb 08, 1941    DOA: 04/24/2014  PCP: Renato Shin, MD  Assessment/Plan: Principal Problem:   Atrial fibrillation with RVR  - Patient seen and examined, noticed to be in tachycardia HR 130s to 140s, BP in 80s, was not on telemetry, EKG obtained, showed atrial ablation with RVR - Will give 500 cc of IV fluid gently given his history of CHF, EF 25-30%, hold Coreg and give one dose of IV digoxin 0.5 mg, repeat 0.25 mg after 6 hours if heart rate not controlled. -Patient has pacemaker, ischemic cardiomyopathy, requested for cardiology consultation, patient follows Dr. Benjamine Mola, last echo in 2/15 showed EF of 25-30% with moderate MR, TR.   Active Problems: Acute renal insufficiency - Resolved, likely due to dehydration, hypotension and ACEI/ARB - No hydronephrosis on renal ultrasound.  - Resolved after IV fluids.   Hyponatremia: Improved, likely due to dehydration vs SIADH  Esophageal cancer (high-grade neuroendocrine carcinoma)/dysphagia/hoarseness (recurrent laryngeal nerve palsy):  - He is status post EGD and dilatation of radiation related stricture on 5/14.  - GI consultation was consulted and patient had barium esophagram which showed smooth distal esophageal stricture-likely malignant. - Further workup by CT chest 6/1 showed progressive metastatic disease involving pleura, liver, chest lymph nodes and bones. - Patient was seen by Dr. Learta Codding, recommended further workup outpatient, has an appointment on 07-Jun-2014 - I also discussed in detail with Dr. Olevia Perches this morning, as patient is tolerating diet, no plan of repeating the endoscopy at this time, recommended to continue Plavix and followup with Dr. Deatra Ina in office in next 7-10 days.    Dehydration -Placed on gentle hydration, orthostatic vitals checked, negative  hypotension, history of chronic systolic CHF, CAD/CABG,& DES 2 left main/ischemic cardiomyopathy LVEF 25-30%/[facility] ICD - Will continue aspirin, Plavix, statin - Holding Coreg due to hypotension - Continue to hold all other antihypertensives, including Lasix, spironolactone,ACEI/ARB    Protein-calorie malnutrition, severe  Back pain: - Much controlled after starting pain medications, likely scheduled to bony metastasis  Diabetes mellitus - Currently stable  DVT Prophylaxis:  Code Status:  Family Communication:Discussed in detail with patient's 2 sons and wife at the bedside  Disposition:Will monitor closely today, if stable from cardiac issues, DC home in a.m.  Consultants:  Gastroenterology, Dr. Olevia Perches  Oncology, Dr. Benay Spice  Cardiology consulted today  Procedures:  None  Antibiotics:  IV Rocephin discontinued 5/29    Subjective: Patient seen and examined, tolerating diet, back pain is controlled, having tachycardia  Objective: Weight change:   Intake/Output Summary (Last 24 hours) at 04/29/14 1153 Last data filed at 04/29/14 0700  Gross per 24 hour  Intake    360 ml  Output    300 ml  Net     60 ml   Blood pressure 86/54, pulse 130, temperature 98.2 F (36.8 C), temperature source Oral, resp. rate 20, height 5\' 7"  (1.702 m), weight 68.493 kg (151 lb), SpO2 92.00%.  Physical Exam: General: Alert and awake, oriented x3, not in any acute distress. CVS:  irregularly irregular, rapid A. fib  Chest: clear to auscultation bilaterally, no wheezing, rales or rhonchi Abdomen: soft nontender, nondistended, normal bowel sounds  Extremities: no cyanosis, clubbing or edema noted bilaterally Neuro: Cranial nerves II-XII intact, no focal neurological deficits  Lab Results: Basic Metabolic Panel:  Recent Labs Lab 04/27/14 0432 04/28/14 0405  NA 132* 133*  K 3.7 3.5*  CL 98 97  CO2 22  24  GLUCOSE 170* 170*  BUN 14 11  CREATININE 1.01 1.02  CALCIUM 8.9 9.1   Liver Function Tests:  Recent Labs Lab 04/24/14 2001 04/25/14 0445  AST 60* 48*  ALT 18 14    ALKPHOS 209* 168*  BILITOT 0.9 0.7  PROT 6.8 5.5*  ALBUMIN 3.1* 2.4*   No results found for this basename: LIPASE, AMYLASE,  in the last 168 hours No results found for this basename: AMMONIA,  in the last 168 hours CBC:  Recent Labs Lab 04/28/14 0405 04/29/14 0333  WBC 7.7 7.6  HGB 9.3* 9.7*  HCT 27.6* 28.6*  MCV 89.9 90.8  PLT 114* 123*   Cardiac Enzymes: No results found for this basename: CKTOTAL, CKMB, CKMBINDEX, TROPONINI,  in the last 168 hours BNP: No components found with this basename: POCBNP,  CBG:  Recent Labs Lab 04/28/14 0746 04/28/14 1203 04/28/14 1709 04/28/14 2247 04/29/14 0746  GLUCAP 178* 158* 134* 140* 144*     Micro Results: Recent Results (from the past 240 hour(s))  CULTURE, BLOOD (ROUTINE X 2)     Status: None   Collection Time    04/24/14  8:05 PM      Result Value Ref Range Status   Specimen Description BLOOD LEFT ARM   Final   Special Requests BOTTLES DRAWN AEROBIC AND ANAEROBIC 5CC   Final   Culture  Setup Time     Final   Value: 04/25/2014 00:45     Performed at [facility]   Culture     Final   Value:        BLOOD CULTURE RECEIVED NO GROWTH TO DATE CULTURE WILL BE HELD FOR 5 DAYS BEFORE ISSUING A FINAL NEGATIVE REPORT     Performed at [facility]   Report Status PENDING   Incomplete  CULTURE, BLOOD (ROUTINE X 2)     Status: None   Collection Time    04/24/14 10:26 PM      Result Value Ref Range Status   Specimen Description BLOOD RIGHT ARM   Final   Special Requests BOTTLES DRAWN AEROBIC AND ANAEROBIC 5CC   Final   Culture  Setup Time     Final   Value: 04/25/2014 00:45     Performed at [facility]   Culture     Final   Value:        BLOOD CULTURE RECEIVED NO GROWTH TO DATE CULTURE WILL BE HELD FOR 5 DAYS BEFORE ISSUING A FINAL NEGATIVE REPORT     Performed at [facility]   Report Status PENDING   Incomplete    Studies/Results: Dg Chest 2 View  04/25/2014   CLINICAL DATA:  Follow-up  pulmonary nodule  EXAM: CHEST  2 VIEW  COMPARISON:  04/24/2014  FINDINGS: Left apical rounded opacity again noted measuring approximately 3 cm. There is no focal parenchymal opacity, pleural effusion, or pneumothorax. Stable cardiomediastinal silhouette. Prior CABG. Single lead cardiac pacer again noted.  The osseous structures are unremarkable.  IMPRESSION: Left apical rounded opacity again noted measuring approximately 3 cm. This may reflect round pneumonia versus abnormality within the chest wall soft tissues. The appearance is unlikely to reflect malignancy given the significant interval enlargement compared with 02/09/2014. Further evaluation with chest CT is recommended.   Electronically Signed   By: Kathreen Devoid   On: 04/25/2014 16:48   Ct Chest Wo Contrast  04/27/2014   CLINICAL DATA:  History of esophageal cancer. Back pain. Currently undergoing  chemotherapy and radiation therapy.  EXAM: CT CHEST WITHOUT CONTRAST  TECHNIQUE: Multidetector CT imaging of the chest was performed following the standard protocol without IV contrast.  COMPARISON:  CT of the chest, abdomen and pelvis 02/09/2014.  FINDINGS: Mediastinum: There are 2 masslike areas in the mediastinum, 1 in the mediastinum immediately posterior to the carina (image 30 of series 2) measuring 4.8 x 3.3 cm, which either arises from the esophagus or is immediately adjacent to the esophagus (favored, likely an enlarged lymph node) exerting significant mass effect upon the esophagus. There is also a 3.2 x 2.7 cm mass in the superior mediastinum to the left side of the trachea and esophagus exerting mass effect upon all adjacent structures (image 13 of series 2). In the distal esophagus immediately above the gastroesophageal junction there is also masslike enlargement which distorts the small amount of oral contrast material in the lumen, best appreciated on image 50 of series 2. Heart size is mildly enlarged. There is no significant pericardial fluid,  thickening or pericardial calcification. There is atherosclerosis of the thoracic aorta, the great vessels of the mediastinum and the coronary arteries, including calcified atherosclerotic plaque in the left main, left anterior descending, left circumflex and right coronary arteries. Stent in left main coronary artery and likely in the mid to distal right coronary artery. Status post median sternotomy for CABG, including LIMA to LAD. Left-sided pacemaker device in position with lead tip terminating in the right ventricular free wall. Dilatation of the pulmonic trunk (4.5 cm in diameter), suggesting underlying pulmonary arterial hypertension.  Lungs/Pleura: Moderate bilateral pleural effusions layer dependently and are simple in appearance on today's non contrast CT examination. These are associated with extensive passive atelectasis throughout the lower lobes of the lungs bilaterally. There are a few patchy micronodules in the periphery of the lungs bilaterally which are nonspecific and favored to be infectious or inflammatory. However, there is also a large pleural-based nodule in the anterior aspect of the left upper lobe best appreciated on image 14 of series 2 measuring approximately 2.9 x 2.9 cm, with apparent direct chest wall invasion between the left first and second ribs. Multiple areas of new nodular pleural thickening are noted in the lateral aspect of the right hemithorax, best appreciated on image 22 and image 31 of series 2, suspicious for pleural metastases.  Upper Abdomen: Small volume of perihepatic ascites. Several ill-defined low-attenuation hepatic lesions are noted on today's examination, poorly evaluated on this noncontrast CT study, however, the largest of these is estimated to measure approximately 2.6 x 2.2 cm (image 59 of series 2), and these lesions appear either new or enlarged compared to the prior examination from 02/09/2014, highly concerning for metastasis.  Musculoskeletal: Multiples  slightly ill-defined mixed lytic and sclerotic areas are noted throughout the visualized axial and appendicular skeleton, suspicious for multifocal metastasis. Specific examples include the lateral aspect of the right fourth rib, lateral aspect of the right sixth rib, lateral aspect of the left fifth rib, as well as multiple small lesions throughout the thoracic spine.  IMPRESSION: 1. Today's study demonstrates progression of disease in the thorax and upper abdomen, with enlargement of apparent esophageal mass immediately above the gastroesophageal junction, progression of mediastinal adenopathy, new pulmonary and pleural metastases, presumably malignant bilateral pleural effusions than or moderate in size, multiple new and enlarging liver lesions, and multiple skeletal lesions, as detailed above. 2. Additional incidental findings, as above.   Electronically Signed   By: Trudie Reed M.D.   On:  04/27/2014 17:37   Dg Esophagus  04/27/2014   CLINICAL DATA:  Esophageal cancer.  Radiation therapy .  EXAM: ESOPHOGRAM/BARIUM SWALLOW  TECHNIQUE: Single contrast examination was performed using  thin barium .  FLUOROSCOPY TIME:  0 min 39 seconds.  COMPARISON:  CT 03/16/2014.  FINDINGS: This a slightly limited exam due to patient's condition. The upper and mid thoracic esophagus were widely patent. Narrowing of the distal esophagus is present. This appeared relatively fixed. Differential diagnosis includes of malignancy and and or scarring. Small sliding hiatal hernia is present.  IMPRESSION: Moderate fixed narrowing of the distal esophagus consistent with recurrent tumor and/or scarring.   Electronically Signed   By: Marcello Moores  Register   On: 04/27/2014 12:07   US Renal  04/26/2014   CLINICAL DATA:  Acute renal failure, elevated creatinine, history of esophageal cancer, diabetes and hypertension  EXAM: RENAL/URINARY TRACT ULTRASOUND COMPLETE  COMPARISON:  CT abdomen pelvis - 02/09/2014  FINDINGS: Right Kidney:  Normal  cortical thickness, echogenicity and size, measuring 11.9 cm in length. No focal renal lesions. No echogenic renal stones. No urinary obstruction.  Left Kidney:  Normal cortical thickness, echogenicity and size, measuring 12.1 cm in length. No focal renal lesions. No echogenic renal stones. No urinary obstruction.  Bladder:  Appears normal for degree of bladder distention.  Other: Note is made of multiple mixed echogenic solid lesions within the incidentally imaged right lobe of the liver with dominant mass measuring approximately 4.2 x 3.2 cm (image 13) with additional lesion measuring approximately 2.4 x 2.0 cm (also seen on image 13).  IMPRESSION: 1. No explanation for patient's acute renal failure. Specifically, no evidence of urinary obstruction. 2. Interval development of multiple liver lesions within the incidentally imaged right lobe of the liver. While several smaller hyperenhancing liver lesions were noted on prior abdominal CT performed 02/09/2014, these lesions appear new and larger and given history of esophageal cancer are worrisome for metastatic disease. Further evaluation with abdominal CT scan and/or dedicated abdominal ultrasound as clinically indicated.   Electronically Signed   By: Sandi Mariscal M.D.   On: 04/26/2014 08:47   Dg Chest Port 1 View  04/24/2014   CLINICAL DATA:  73 year old male with hypotension. Pain. Initial encounter.  EXAM: PORTABLE CHEST - 1 VIEW  COMPARISON:  03/14/2014 and earlier.  FINDINGS: Portable AP upright view at 2017 hrs. New since last month is indistinct 3 cm nodular opacity in the left lung apex. There seems to be some additional associated extra pulmonary density near the left shoulder (horizontal arrow).  Otherwise improved lung base ventilation. Lung parenchyma elsewhere appears stable with no pneumothorax or effusion. Stable cardiomegaly and mediastinal contours. Stable left chest cardiac AICD. Visualized tracheal air column is within normal limits.   IMPRESSION: 1. A left upper lobe 3 cm pulmonary nodule is new since last month, suggesting either infectious etiology or artifact. PA and lateral chest radiographs would be valuable when possible. Alternatively, chest CT (IV contrast preferred) could be utilized. 2. Improved bibasilar ventilation since April. No other new cardiopulmonary abnormality identified.   Electronically Signed   By: Lars Pinks M.D.   On: 04/24/2014 20:30    Medications: Scheduled Meds: . aspirin EC  81 mg Oral Daily  . atorvastatin  80 mg Oral QHS  . carvedilol  6.25 mg Oral BID WC  . citalopram  10 mg Oral Daily  . clopidogrel  75 mg Oral Daily  . digoxin  0.25 mg Intravenous Once  . docusate sodium  100 mg Oral BID  . folic acid  1 mg Oral Daily  . heparin  5,000 Units Subcutaneous 3 times per day  . insulin aspart  0-5 Units Subcutaneous QHS  . insulin aspart  0-9 Units Subcutaneous TID WC  . insulin glargine  10 Units Subcutaneous Daily  . morphine  15 mg Oral Q12H  . multivitamin with minerals  1 tablet Oral Daily  . sodium chloride  3 mL Intravenous Q12H  . thiamine  100 mg Oral Daily      LOS: 5 days   Nandita Mathenia Krystal Eaton M.D. [facility] 04/29/2014, 11:53 AM Pager: [phone]  If 7PM-7AM, please contact night-coverage [email or web address] Password [email or web address]  **Disclaimer: This note was dictated with voice recognition software. Similar sounding words can inadvertently be transcribed and this note may contain transcription errors which may not have been corrected upon publication of note.**

## 2014-04-29 NOTE — Progress Notes (Signed)
Follow-up  -Chart reviewed and spoke with patient at bedside who informed he will not be discharging today; son also informed Cardiologist to see pt later today. [facility] aware of delay in discharge- confirmed with [facility] family request for [employer] and Shower chair to be brought to the room prior to discharge. Writer will continue to follow and assist with any hospice needs at discharge. Danton Sewer, [facility] Liaison [phone]

## 2014-04-29 NOTE — Progress Notes (Addendum)
Pt HR sustaining in 130's. Pt is asymptomatic. NP on call notified. New orders placed. Will continue to monitor.

## 2014-04-29 NOTE — Progress Notes (Signed)
Esophageal Ca  With distal esophageal obstruction, s/p dilation  04/09/2014, pt is able to eat sausage and eggs this morning. No regurgitation. OK to discharge today, follow up Dr Deatra Ina in 2 weeks or earlier if dysphagia recurs.

## 2014-04-29 NOTE — Progress Notes (Signed)
Physical Therapy Treatment Patient Details Name: Billy Bush MRN: [id number] DOB: Oct 26, 1941 Today's Date: 04/29/2014    History of Present Illness 73 yo male admitted with dehydration, hyponatremia, hypotension, weakness. Hx of HTN, DM, peripheral neuropathy, anxiety, esophageal cancer, CABG.     PT Comments    Pt feeling "Okay".  Amb around unit with RW with increased time (slow gait).  BP standing was 96/67 with HR 124.  Pt with no c/o's.  Follow Up Recommendations  Home health PT;Supervision/Assistance - 24 hour     Equipment Recommendations  Rolling walker with 5" wheels    Recommendations for Other Services       Precautions / Restrictions Precautions Precautions: Fall Restrictions Weight Bearing Restrictions: No    Mobility  Bed Mobility               General bed mobility comments: pt sitting EOB with NT on arrival  Transfers Overall transfer level: Needs assistance Equipment used: Rolling walker (2 wheeled) Transfers: Sit to/from Stand Sit to Stand: From elevated surface;Min assist         General transfer comment: assist to rise, stabilize, control descent. VCs safety, hand placement  Ambulation/Gait Ambulation/Gait assistance: Min guard;Min assist Ambulation Distance (Feet): 500 Feet Assistive device: Rolling walker (2 wheeled) Gait Pattern/deviations: Decreased stride length Gait velocity: decreased   General Gait Details: slow gait speed. assist to stabilize throughout ambulation. a few brief standing rest breaks needed.    Stairs            Wheelchair Mobility    Modified Rankin (Stroke Patients Only)       Balance                                    Cognition                            Exercises      General Comments        Pertinent Vitals/Pain No c/o pain    Home Living                      Prior Function            PT Goals (current goals can now be found in the care  plan section) Progress towards PT goals: Progressing toward goals    Frequency  Min 3X/week    PT Plan      Co-evaluation             End of Session Equipment Utilized During Treatment: Gait belt Activity Tolerance: Patient tolerated treatment well Patient left: in chair;with call bell/phone within reach;with chair alarm set;with family/visitor present     Time: 1525-1550 PT Time Calculation (min): 25 min  Charges:  $Gait Training: 23-37 mins                    G Codes:      Rica Koyanagi  PTA [facility]  Acute  Rehab Pager      [phone]

## 2014-04-30 LAB — GLUCOSE, CAPILLARY
Glucose-Capillary: 161 mg/dL — ABNORMAL HIGH (ref 70–99)
Glucose-Capillary: 159 mg/dL — ABNORMAL HIGH (ref 70–99)
Glucose-Capillary: 200 mg/dL — ABNORMAL HIGH (ref 70–99)
Glucose-Capillary: 176 mg/dL — ABNORMAL HIGH (ref 70–99)

## 2014-04-30 NOTE — Progress Notes (Signed)
Follow-up: [facility] continues to follow for hospice needs at discharge;  Chart reviewed and spoke earlier with Dr Tana Coast, note heart medications continue to be adjusted. Writer spoke with pt and wife at bedside; pt indicated pain is controlled today; requested DME was brought to the room and family will take this home; pt stated possibility of discharge tomorrow and at this time, pt requests [facility] initial admission visit be Monday 05/04/14 - [facility]erral [facility] aware and have scheduled RN visit time for 2:30-3:00pm. Wife has [facility] contact number. Writer will continue to follow and touch base with family tomorrow to re-confirm this plan.  Please call with any questions  Danton Sewer, RN, MSN, [facility] Liaison  [phone]

## 2014-04-30 NOTE — Progress Notes (Signed)
Have paged Dr Radford Pax to notified him of cahned in EKG for Mr Savant room 1434     DR Rai ware of changes.

## 2014-04-30 NOTE — Progress Notes (Signed)
[facility]  [facility] is providing the following services: RW and Tubseat w/back  If patient discharges after hours, please call [phone].   Billy Bush 04/30/2014, 11:29 AM

## 2014-04-30 NOTE — Progress Notes (Signed)
Patient ID: Billy Bush  male  [id number]    DOB: 05/10/1941    DOA: 04/24/2014  PCP: Renato Shin, MD  Assessment/Plan: Principal Problem:   Atrial fibrillation with RVR  - HR better controlled today, on amiodarone drip , will be transitioned to oral amiodarone once the drip is off  - Not on any anticoagulation due to risk of GI bleeding with esophageal cancer   Active Problems: Acute renal insufficiency - Resolved, likely due to dehydration, hypotension and ACEI/ARB - No hydronephrosis on renal ultrasound.  - Resolved after IV fluids.   Hyponatremia: Improved, likely due to dehydration vs SIADH  Esophageal cancer (high-grade neuroendocrine carcinoma)/dysphagia/hoarseness (recurrent laryngeal nerve palsy):  - He is status post EGD and dilatation of radiation related stricture on 5/14.  - GI consultation was consulted and patient had barium esophagram which showed smooth distal esophageal stricture-likely malignant. - Further workup by CT chest 6/1 showed progressive metastatic disease involving pleura, liver, chest lymph nodes and bones. - Patient was seen by Dr. Learta Codding, recommended further workup outpatient, has an appointment on Jun 04, 2014 - I also discussed in detail with Dr. Olevia Perches, as patient is tolerating diet, no plan of repeating the endoscopy at this time, recommended to continue Plavix and followup with Dr. Deatra Ina in office in next 7-10 days.  hypotension, history of chronic systolic CHF, CAD/CABG,& DES 2 left main/ischemic cardiomyopathy LVEF 25-30%/[employer] ICD - Will continue aspirin, Plavix, statin - Continue to hold all other antihypertensives, including Lasix, spironolactone,ACEI/ARB    Protein-calorie malnutrition, severe  Back pain: - Much controlled after starting pain medications, likely scheduled to bony metastasis  Diabetes mellitus - Currently stable  DVT Prophylaxis:  Code Status:  Family Communication:Discussed in detail with patient and  patient's son at the bedside  Disposition:  Consultants:  Gastroenterology, Dr. Olevia Perches  Oncology, Dr. Benay Spice  Cardiology   Procedures:  None  Antibiotics:  IV Rocephin discontinued 5/29    Subjective: Patient seen and examined, heart rate better controlled today, son at the bedside, no acute complaints  Objective: Weight change:   Intake/Output Summary (Last 24 hours) at 04/30/14 1153 Last data filed at 04/30/14 0900  Gross per 24 hour  Intake  487.5 ml  Output    175 ml  Net  312.5 ml   Blood pressure 109/69, pulse 111, temperature 97.6 F (36.4 C), temperature source Oral, resp. rate 16, height 5\' 7"  (1.702 m), weight 68.493 kg (151 lb), SpO2 91.00%.  Physical Exam: General: Alert and awake, oriented x3, not in any acute distress. CVS:  irregularly irregular, rapid A. fib  Chest: clear to auscultation bilaterally, no wheezing, rales or rhonchi Abdomen: soft nontender, nondistended, normal bowel sounds  Extremities: no cyanosis, clubbing or edema noted bilaterally Neuro: Cranial nerves II-XII intact, no focal neurological deficits  Lab Results: Basic Metabolic Panel:  Recent Labs Lab 04/27/14 0432 04/28/14 0405  NA 132* 133*  K 3.7 3.5*  CL 98 97  CO2 22 24  GLUCOSE 170* 170*  BUN 14 11  CREATININE 1.01 1.02  CALCIUM 8.9 9.1   Liver Function Tests:  Recent Labs Lab 04/24/14 2001 04/25/14 0445  AST 60* 48*  ALT 18 14  ALKPHOS 209* 168*  BILITOT 0.9 0.7  PROT 6.8 5.5*  ALBUMIN 3.1* 2.4*   No results found for this basename: LIPASE, AMYLASE,  in the last 168 hours No results found for this basename: AMMONIA,  in the last 168 hours CBC:  Recent Labs Lab 04/28/14 0405 04/29/14  0333  WBC 7.7 7.6  HGB 9.3* 9.7*  HCT 27.6* 28.6*  MCV 89.9 90.8  PLT 114* 123*   Cardiac Enzymes: No results found for this basename: CKTOTAL, CKMB, CKMBINDEX, TROPONINI,  in the last 168 hours BNP: No components found with this basename: POCBNP,   CBG:  Recent Labs Lab 04/29/14 1206 04/29/14 1722 04/29/14 2209 04/30/14 0735 04/30/14 1149  GLUCAP 144* 123* 122* 161* 159*     Micro Results: Recent Results (from the past 240 hour(s))  CULTURE, BLOOD (ROUTINE X 2)     Status: None   Collection Time    04/24/14  8:05 PM      Result Value Ref Range Status   Specimen Description BLOOD LEFT ARM   Final   Special Requests BOTTLES DRAWN AEROBIC AND ANAEROBIC 5CC   Final   Culture  Setup Time     Final   Value: 04/25/2014 00:45     Performed at [facility]   Culture     Final   Value:        BLOOD CULTURE RECEIVED NO GROWTH TO DATE CULTURE WILL BE HELD FOR 5 DAYS BEFORE ISSUING A FINAL NEGATIVE REPORT     Performed at [facility]   Report Status PENDING   Incomplete  CULTURE, BLOOD (ROUTINE X 2)     Status: None   Collection Time    04/24/14 10:26 PM      Result Value Ref Range Status   Specimen Description BLOOD RIGHT ARM   Final   Special Requests BOTTLES DRAWN AEROBIC AND ANAEROBIC 5CC   Final   Culture  Setup Time     Final   Value: 04/25/2014 00:45     Performed at [facility]   Culture     Final   Value:        BLOOD CULTURE RECEIVED NO GROWTH TO DATE CULTURE WILL BE HELD FOR 5 DAYS BEFORE ISSUING A FINAL NEGATIVE REPORT     Performed at [facility]   Report Status PENDING   Incomplete    Studies/Results: Dg Chest 2 View  04/25/2014   CLINICAL DATA:  Follow-up pulmonary nodule  EXAM: CHEST  2 VIEW  COMPARISON:  04/24/2014  FINDINGS: Left apical rounded opacity again noted measuring approximately 3 cm. There is no focal parenchymal opacity, pleural effusion, or pneumothorax. Stable cardiomediastinal silhouette. Prior CABG. Single lead cardiac pacer again noted.  The osseous structures are unremarkable.  IMPRESSION: Left apical rounded opacity again noted measuring approximately 3 cm. This may reflect round pneumonia versus abnormality within the chest wall soft tissues. The  appearance is unlikely to reflect malignancy given the significant interval enlargement compared with 02/09/2014. Further evaluation with chest CT is recommended.   Electronically Signed   By: Kathreen Devoid   On: 04/25/2014 16:48   Ct Chest Wo Contrast  04/27/2014   CLINICAL DATA:  History of esophageal cancer. Back pain. Currently undergoing chemotherapy and radiation therapy.  EXAM: CT CHEST WITHOUT CONTRAST  TECHNIQUE: Multidetector CT imaging of the chest was performed following the standard protocol without IV contrast.  COMPARISON:  CT of the chest, abdomen and pelvis 02/09/2014.  FINDINGS: Mediastinum: There are 2 masslike areas in the mediastinum, 1 in the mediastinum immediately posterior to the carina (image 30 of series 2) measuring 4.8 x 3.3 cm, which either arises from the esophagus or is immediately adjacent to the esophagus (favored, likely an enlarged lymph node) exerting significant mass effect upon  the esophagus. There is also a 3.2 x 2.7 cm mass in the superior mediastinum to the left side of the trachea and esophagus exerting mass effect upon all adjacent structures (image 13 of series 2). In the distal esophagus immediately above the gastroesophageal junction there is also masslike enlargement which distorts the small amount of oral contrast material in the lumen, best appreciated on image 50 of series 2. Heart size is mildly enlarged. There is no significant pericardial fluid, thickening or pericardial calcification. There is atherosclerosis of the thoracic aorta, the great vessels of the mediastinum and the coronary arteries, including calcified atherosclerotic plaque in the left main, left anterior descending, left circumflex and right coronary arteries. Stent in left main coronary artery and likely in the mid to distal right coronary artery. Status post median sternotomy for CABG, including LIMA to LAD. Left-sided pacemaker device in position with lead tip terminating in the right  ventricular free wall. Dilatation of the pulmonic trunk (4.5 cm in diameter), suggesting underlying pulmonary arterial hypertension.  Lungs/Pleura: Moderate bilateral pleural effusions layer dependently and are simple in appearance on today's non contrast CT examination. These are associated with extensive passive atelectasis throughout the lower lobes of the lungs bilaterally. There are a few patchy micronodules in the periphery of the lungs bilaterally which are nonspecific and favored to be infectious or inflammatory. However, there is also a large pleural-based nodule in the anterior aspect of the left upper lobe best appreciated on image 14 of series 2 measuring approximately 2.9 x 2.9 cm, with apparent direct chest wall invasion between the left first and second ribs. Multiple areas of new nodular pleural thickening are noted in the lateral aspect of the right hemithorax, best appreciated on image 22 and image 31 of series 2, suspicious for pleural metastases.  Upper Abdomen: Small volume of perihepatic ascites. Several ill-defined low-attenuation hepatic lesions are noted on today's examination, poorly evaluated on this noncontrast CT study, however, the largest of these is estimated to measure approximately 2.6 x 2.2 cm (image 59 of series 2), and these lesions appear either new or enlarged compared to the prior examination from 02/09/2014, highly concerning for metastasis.  Musculoskeletal: Multiples slightly ill-defined mixed lytic and sclerotic areas are noted throughout the visualized axial and appendicular skeleton, suspicious for multifocal metastasis. Specific examples include the lateral aspect of the right fourth rib, lateral aspect of the right sixth rib, lateral aspect of the left fifth rib, as well as multiple small lesions throughout the thoracic spine.  IMPRESSION: 1. Today's study demonstrates progression of disease in the thorax and upper abdomen, with enlargement of apparent esophageal mass  immediately above the gastroesophageal junction, progression of mediastinal adenopathy, new pulmonary and pleural metastases, presumably malignant bilateral pleural effusions than or moderate in size, multiple new and enlarging liver lesions, and multiple skeletal lesions, as detailed above. 2. Additional incidental findings, as above.   Electronically Signed   By: Vinnie Langton M.D.   On: 04/27/2014 17:37   Dg Esophagus  04/27/2014   CLINICAL DATA:  Esophageal cancer.  Radiation therapy .  EXAM: ESOPHOGRAM/BARIUM SWALLOW  TECHNIQUE: Single contrast examination was performed using  thin barium .  FLUOROSCOPY TIME:  0 min 39 seconds.  COMPARISON:  CT 03/16/2014.  FINDINGS: This a slightly limited exam due to patient's condition. The upper and mid thoracic esophagus were widely patent. Narrowing of the distal esophagus is present. This appeared relatively fixed. Differential diagnosis includes of malignancy and and or scarring. Small sliding hiatal hernia  is present.  IMPRESSION: Moderate fixed narrowing of the distal esophagus consistent with recurrent tumor and/or scarring.   Electronically Signed   By: Marcello Moores  Register   On: 04/27/2014 12:07   US Renal  04/26/2014   CLINICAL DATA:  Acute renal failure, elevated creatinine, history of esophageal cancer, diabetes and hypertension  EXAM: RENAL/URINARY TRACT ULTRASOUND COMPLETE  COMPARISON:  CT abdomen pelvis - 02/09/2014  FINDINGS: Right Kidney:  Normal cortical thickness, echogenicity and size, measuring 11.9 cm in length. No focal renal lesions. No echogenic renal stones. No urinary obstruction.  Left Kidney:  Normal cortical thickness, echogenicity and size, measuring 12.1 cm in length. No focal renal lesions. No echogenic renal stones. No urinary obstruction.  Bladder:  Appears normal for degree of bladder distention.  Other: Note is made of multiple mixed echogenic solid lesions within the incidentally imaged right lobe of the liver with dominant mass  measuring approximately 4.2 x 3.2 cm (image 13) with additional lesion measuring approximately 2.4 x 2.0 cm (also seen on image 13).  IMPRESSION: 1. No explanation for patient's acute renal failure. Specifically, no evidence of urinary obstruction. 2. Interval development of multiple liver lesions within the incidentally imaged right lobe of the liver. While several smaller hyperenhancing liver lesions were noted on prior abdominal CT performed 02/09/2014, these lesions appear new and larger and given history of esophageal cancer are worrisome for metastatic disease. Further evaluation with abdominal CT scan and/or dedicated abdominal ultrasound as clinically indicated.   Electronically Signed   By: Sandi Mariscal M.D.   On: 04/26/2014 08:47   Dg Chest Port 1 View  04/24/2014   CLINICAL DATA:  73 year old male with hypotension. Pain. Initial encounter.  EXAM: PORTABLE CHEST - 1 VIEW  COMPARISON:  03/14/2014 and earlier.  FINDINGS: Portable AP upright view at 2017 hrs. New since last month is indistinct 3 cm nodular opacity in the left lung apex. There seems to be some additional associated extra pulmonary density near the left shoulder (horizontal arrow).  Otherwise improved lung base ventilation. Lung parenchyma elsewhere appears stable with no pneumothorax or effusion. Stable cardiomegaly and mediastinal contours. Stable left chest cardiac AICD. Visualized tracheal air column is within normal limits.  IMPRESSION: 1. A left upper lobe 3 cm pulmonary nodule is new since last month, suggesting either infectious etiology or artifact. PA and lateral chest radiographs would be valuable when possible. Alternatively, chest CT (IV contrast preferred) could be utilized. 2. Improved bibasilar ventilation since April. No other new cardiopulmonary abnormality identified.   Electronically Signed   By: Lars Pinks M.D.   On: 04/24/2014 20:30    Medications: Scheduled Meds: . amiodarone  200 mg Oral Q12H   Followed by  .  [START ON 05/07/2014] amiodarone  200 mg Oral Daily  . aspirin EC  81 mg Oral Daily  . atorvastatin  80 mg Oral QHS  . carvedilol  6.25 mg Oral BID WC  . citalopram  10 mg Oral Daily  . clopidogrel  75 mg Oral Daily  . docusate sodium  100 mg Oral BID  . folic acid  1 mg Oral Daily  . heparin  5,000 Units Subcutaneous 3 times per day  . insulin aspart  0-5 Units Subcutaneous QHS  . insulin aspart  0-9 Units Subcutaneous TID WC  . insulin glargine  10 Units Subcutaneous Daily  . morphine  15 mg Oral Q12H  . multivitamin with minerals  1 tablet Oral Daily  . sodium chloride  3 mL  Intravenous Q12H  . thiamine  100 mg Oral Daily      LOS: 6 days   Fleetwood Pierron Krystal Eaton M.D. [facility] 04/30/2014, 11:53 AM Pager: [phone]  If 7PM-7AM, please contact night-coverage [email or web address] Password [email or web address]  **Disclaimer: This note was dictated with voice recognition software. Similar sounding words can inadvertently be transcribed and this note may contain transcription errors which may not have been corrected upon publication of note.**

## 2014-04-30 NOTE — Care Management Note (Addendum)
    Page 1 of 2   05/01/2014     3:42:07 PM CARE MANAGEMENT NOTE 05/01/2014  Patient:  Billy Bush, Billy Bush   Account Number:  [id number]  Date Initiated:  04/26/2014  Documentation initiated by:  [facility]  Subjective/Objective Assessment:   Dysphagia, esophageal cancer     Action/Plan:   Anticipated DC Date:  05/01/2014   Anticipated DC Plan:  [place]  CM consult      Choice offered to / List presented to:  C-4 Adult Children        [place] arranged  HH-1 RN  [place] agency  [facility] [place]   Status of service:  Completed, signed off Medicare Important Message given?  YES (If response is "NO", the following Medicare IM given date fields will be blank) Date Medicare IM given:  04/24/2014 Date Additional Medicare IM given:  04/30/2014  Discharge Disposition:  [place]  Per UR Regulation:  Reviewed for med. necessity/level of care/duration of stay  If discussed at [place] of Stay Meetings, dates discussed:   04/30/2014    Comments:  05/01/14 Billy Wages RN,BSN NCM [phone] D/C HOME W/[facility] REP AWARE,HOME VISIT ARRANGED W/FAMILY FOR MONDAY,ALL DME ALREADY MANAGED THROUGH [facility].FAMILY WILL TRANSPORT HOME.NO FURTHER D/C NEEDS.  04/30/14 Billy Ferrin RN,BSN NCM [place] D/C HOME W/HOSPICE SERVICES,& ALSO DME NEEDS.  04/28/14 [name], RN, BSN Spoke with pt's wife, son and daughter in [employer]. Billy Rein, RN is one of p's daughter in law. Family wanted to speak with her concerning Hospice. Billy Bush states family selected [facility], referral given to in house rep.  04/25/2014 1700 NCM spoke to dtr-in-law, Billy Bush. Requesting [facility] for [facility]. Waiting for final [place] orders for home. Billy Finner RN CCM Case Mgmt phone [phone]

## 2014-04-30 NOTE — Progress Notes (Signed)
SUBJECTIVE:  No SOB or CP  OBJECTIVE:   Vitals:   Filed Vitals:   04/29/14 1848 04/29/14 2153 04/30/14 0049 04/30/14 0538  BP: 87/70 85/57 101/56   Pulse:  111 96   Temp:  98.2 F (36.8 C)  97.6 F (36.4 C)  TempSrc:  Oral  Oral  Resp:  16  16  Height:      Weight:      SpO2:  93%  91%   I&O's:   Intake/Output Summary (Last 24 hours) at 04/30/14 0816 Last data filed at 04/30/14 0543  Gross per 24 hour  Intake  727.5 ml  Output     75 ml  Net  652.5 ml   TELEMETRY: Reviewed telemetry pt in atrial fibrillation at 102bpm:     PHYSICAL EXAM General: Well developed, well nourished, in no acute distress Head: Eyes PERRLA, No xanthomas.   Normal cephalic and atramatic  Lungs:   Clear bilaterally to auscultation and percussion. Heart:   Irregularly irregular S1 S2 Pulses are 2+ & equal. Abdomen: Bowel sounds are positive, abdomen soft and non-tender without masses Extremities:   No clubbing, cyanosis or edema.  DP +1 Neuro: Alert and oriented X 3. Psych:  Good affect, responds appropriately   LABS: Basic Metabolic Panel:  Recent Labs  04/28/14 0405  NA 133*  K 3.5*  CL 97  CO2 24  GLUCOSE 170*  BUN 11  CREATININE 1.02  CALCIUM 9.1   Liver Function Tests: No results found for this basename: AST, ALT, ALKPHOS, BILITOT, PROT, ALBUMIN,  in the last 72 hours No results found for this basename: LIPASE, AMYLASE,  in the last 72 hours CBC:  Recent Labs  04/28/14 0405 04/29/14 0333  WBC 7.7 7.6  HGB 9.3* 9.7*  HCT 27.6* 28.6*  MCV 89.9 90.8  PLT 114* 123*   Cardiac Enzymes: No results found for this basename: CKTOTAL, CKMB, CKMBINDEX, TROPONINI,  in the last 72 hours BNP: No components found with this basename: POCBNP,  D-Dimer: No results found for this basename: DDIMER,  in the last 72 hours Hemoglobin A1C: No results found for this basename: HGBA1C,  in the last 72 hours Fasting Lipid Panel: No results found for this basename: CHOL, HDL, LDLCALC,  TRIG, CHOLHDL, LDLDIRECT,  in the last 72 hours Thyroid Function Tests: No results found for this basename: TSH, T4TOTAL, FREET3, T3FREE, THYROIDAB,  in the last 72 hours Anemia Panel: No results found for this basename: VITAMINB12, FOLATE, FERRITIN, TIBC, IRON, RETICCTPCT,  in the last 72 hours Coag Panel:   Lab Results  Component Value Date   INR 1.24 04/25/2014   INR 1.0 RATIO 07/02/2007   INR 1.0 RATIO 05/21/2007    RADIOLOGY: Dg Chest 2 View  04/25/2014   CLINICAL DATA:  Follow-up pulmonary nodule  EXAM: CHEST  2 VIEW  COMPARISON:  04/24/2014  FINDINGS: Left apical rounded opacity again noted measuring approximately 3 cm. There is no focal parenchymal opacity, pleural effusion, or pneumothorax. Stable cardiomediastinal silhouette. Prior CABG. Single lead cardiac pacer again noted.  The osseous structures are unremarkable.  IMPRESSION: Left apical rounded opacity again noted measuring approximately 3 cm. This may reflect round pneumonia versus abnormality within the chest wall soft tissues. The appearance is unlikely to reflect malignancy given the significant interval enlargement compared with 02/09/2014. Further evaluation with chest CT is recommended.   Electronically Signed   By: Kathreen Devoid   On: 04/25/2014 16:48   Ct Chest Wo Contrast  04/27/2014  CLINICAL DATA:  History of esophageal cancer. Back pain. Currently undergoing chemotherapy and radiation therapy.  EXAM: CT CHEST WITHOUT CONTRAST  TECHNIQUE: Multidetector CT imaging of the chest was performed following the standard protocol without IV contrast.  COMPARISON:  CT of the chest, abdomen and pelvis 02/09/2014.  FINDINGS: Mediastinum: There are 2 masslike areas in the mediastinum, 1 in the mediastinum immediately posterior to the carina (image 30 of series 2) measuring 4.8 x 3.3 cm, which either arises from the esophagus or is immediately adjacent to the esophagus (favored, likely an enlarged lymph node) exerting significant mass effect  upon the esophagus. There is also a 3.2 x 2.7 cm mass in the superior mediastinum to the left side of the trachea and esophagus exerting mass effect upon all adjacent structures (image 13 of series 2). In the distal esophagus immediately above the gastroesophageal junction there is also masslike enlargement which distorts the small amount of oral contrast material in the lumen, best appreciated on image 50 of series 2. Heart size is mildly enlarged. There is no significant pericardial fluid, thickening or pericardial calcification. There is atherosclerosis of the thoracic aorta, the great vessels of the mediastinum and the coronary arteries, including calcified atherosclerotic plaque in the left main, left anterior descending, left circumflex and right coronary arteries. Stent in left main coronary artery and likely in the mid to distal right coronary artery. Status post median sternotomy for CABG, including LIMA to LAD. Left-sided pacemaker device in position with lead tip terminating in the right ventricular free wall. Dilatation of the pulmonic trunk (4.5 cm in diameter), suggesting underlying pulmonary arterial hypertension.  Lungs/Pleura: Moderate bilateral pleural effusions layer dependently and are simple in appearance on today's non contrast CT examination. These are associated with extensive passive atelectasis throughout the lower lobes of the lungs bilaterally. There are a few patchy micronodules in the periphery of the lungs bilaterally which are nonspecific and favored to be infectious or inflammatory. However, there is also a large pleural-based nodule in the anterior aspect of the left upper lobe best appreciated on image 14 of series 2 measuring approximately 2.9 x 2.9 cm, with apparent direct chest wall invasion between the left first and second ribs. Multiple areas of new nodular pleural thickening are noted in the lateral aspect of the right hemithorax, best appreciated on image 22 and image 31 of  series 2, suspicious for pleural metastases.  Upper Abdomen: Small volume of perihepatic ascites. Several ill-defined low-attenuation hepatic lesions are noted on today's examination, poorly evaluated on this noncontrast CT study, however, the largest of these is estimated to measure approximately 2.6 x 2.2 cm (image 59 of series 2), and these lesions appear either new or enlarged compared to the prior examination from 02/09/2014, highly concerning for metastasis.  Musculoskeletal: Multiples slightly ill-defined mixed lytic and sclerotic areas are noted throughout the visualized axial and appendicular skeleton, suspicious for multifocal metastasis. Specific examples include the lateral aspect of the right fourth rib, lateral aspect of the right sixth rib, lateral aspect of the left fifth rib, as well as multiple small lesions throughout the thoracic spine.  IMPRESSION: 1. Today's study demonstrates progression of disease in the thorax and upper abdomen, with enlargement of apparent esophageal mass immediately above the gastroesophageal junction, progression of mediastinal adenopathy, new pulmonary and pleural metastases, presumably malignant bilateral pleural effusions than or moderate in size, multiple new and enlarging liver lesions, and multiple skeletal lesions, as detailed above. 2. Additional incidental findings, as above.   Electronically  Signed   By: Vinnie Langton M.D.   On: 04/27/2014 17:37   Dg Esophagus  04/27/2014   CLINICAL DATA:  Esophageal cancer.  Radiation therapy .  EXAM: ESOPHOGRAM/BARIUM SWALLOW  TECHNIQUE: Single contrast examination was performed using  thin barium .  FLUOROSCOPY TIME:  0 min 39 seconds.  COMPARISON:  CT 03/16/2014.  FINDINGS: This a slightly limited exam due to patient's condition. The upper and mid thoracic esophagus were widely patent. Narrowing of the distal esophagus is present. This appeared relatively fixed. Differential diagnosis includes of malignancy and and or  scarring. Small sliding hiatal hernia is present.  IMPRESSION: Moderate fixed narrowing of the distal esophagus consistent with recurrent tumor and/or scarring.   Electronically Signed   By: Marcello Moores  Register   On: 04/27/2014 12:07   US Renal  04/26/2014   CLINICAL DATA:  Acute renal failure, elevated creatinine, history of esophageal cancer, diabetes and hypertension  EXAM: RENAL/URINARY TRACT ULTRASOUND COMPLETE  COMPARISON:  CT abdomen pelvis - 02/09/2014  FINDINGS: Right Kidney:  Normal cortical thickness, echogenicity and size, measuring 11.9 cm in length. No focal renal lesions. No echogenic renal stones. No urinary obstruction.  Left Kidney:  Normal cortical thickness, echogenicity and size, measuring 12.1 cm in length. No focal renal lesions. No echogenic renal stones. No urinary obstruction.  Bladder:  Appears normal for degree of bladder distention.  Other: Note is made of multiple mixed echogenic solid lesions within the incidentally imaged right lobe of the liver with dominant mass measuring approximately 4.2 x 3.2 cm (image 13) with additional lesion measuring approximately 2.4 x 2.0 cm (also seen on image 13).  IMPRESSION: 1. No explanation for patient's acute renal failure. Specifically, no evidence of urinary obstruction. 2. Interval development of multiple liver lesions within the incidentally imaged right lobe of the liver. While several smaller hyperenhancing liver lesions were noted on prior abdominal CT performed 02/09/2014, these lesions appear new and larger and given history of esophageal cancer are worrisome for metastatic disease. Further evaluation with abdominal CT scan and/or dedicated abdominal ultrasound as clinically indicated.   Electronically Signed   By: Sandi Mariscal M.D.   On: 04/26/2014 08:47   Dg Chest Port 1 View  04/24/2014   CLINICAL DATA:  73 year old male with hypotension. Pain. Initial encounter.  EXAM: PORTABLE CHEST - 1 VIEW  COMPARISON:  03/14/2014 and earlier.   FINDINGS: Portable AP upright view at 2017 hrs. New since last month is indistinct 3 cm nodular opacity in the left lung apex. There seems to be some additional associated extra pulmonary density near the left shoulder (horizontal arrow).  Otherwise improved lung base ventilation. Lung parenchyma elsewhere appears stable with no pneumothorax or effusion. Stable cardiomegaly and mediastinal contours. Stable left chest cardiac AICD. Visualized tracheal air column is within normal limits.  IMPRESSION: 1. A left upper lobe 3 cm pulmonary nodule is new since last month, suggesting either infectious etiology or artifact. PA and lateral chest radiographs would be valuable when possible. Alternatively, chest CT (IV contrast preferred) could be utilized. 2. Improved bibasilar ventilation since April. No other new cardiopulmonary abnormality identified.   Electronically Signed   By: Lars Pinks M.D.   On: 04/24/2014 20:30   Assessment/Plan  Principal Problem:  Atrial fibrillation with RVR, coreg has been held due to hypotension, Dig added HR continues to be elevated. Not a candidate for anticoagulation due to high risk for bleed. Transfused this admit. Friable esophagus. Would continue ASA and Plavix for now.  Active Problems:  DIABETES MELLITUS, TYPE I  Other and unspecified hyperlipidemia  ANEMIA, IRON DEFICIENCY  THROMBOCYTOPENIA  HYPERTENSION  CORONARY ARTERY DISEASE  SYSTOLIC HEART FAILURE, CHRONIC- monitor IV fluid with EF 20-25%  Hypotension  Hyponatremia  Dehydration  Acute renal failure  Protein-calorie malnutrition, severe  Cecilie Kicks Nurse Practitioner Certified  [place]  Pager [phone] or after 5pm or weekends call [phone]  04/29/2014, 2:08 PM   PLAN:  Persistent RVR despite current rate control meds. AV nodal blockade limited by hypotension. HR much improved on IV Amio gtt Continue IV amiodarone for rate (possibly even rhythm) control. Will change to PO  tomorrow Anticoagulation is not an option due to GI bleeding requiring transfusion. I even wonder whether we should consider stopping clopidogrel (his stent is 71 years old).  In view of recurrent and rapidly spreading esophageal cancer, need to discuss turning off tachyarrhythmia therapies.  Sueanne Margarita, MD  04/30/2014  8:16 AM

## 2014-05-01 ENCOUNTER — Telehealth: Payer: Self-pay | Admitting: Cardiology

## 2014-05-01 DIAGNOSIS — I5022 Chronic systolic (congestive) heart failure: Secondary | ICD-10-CM

## 2014-05-01 LAB — CBC
HEMATOCRIT: 26.2 % — AB (ref 39.0–52.0)
Hemoglobin: 8.9 g/dL — ABNORMAL LOW (ref 13.0–17.0)
MCH: 30.8 pg (ref 26.0–34.0)
MCHC: 34 g/dL (ref 30.0–36.0)
MCV: 90.7 fL (ref 78.0–100.0)
PLATELETS: 96 10*3/uL — AB (ref 150–400)
RBC: 2.89 MIL/uL — ABNORMAL LOW (ref 4.22–5.81)
RDW: 14.4 % (ref 11.5–15.5)
WBC: 6.5 10*3/uL (ref 4.0–10.5)

## 2014-05-01 LAB — CULTURE, BLOOD (ROUTINE X 2)
CULTURE: NO GROWTH
Culture: NO GROWTH

## 2014-05-01 LAB — BASIC METABOLIC PANEL
BUN: 13 mg/dL (ref 6–23)
CALCIUM: 9 mg/dL (ref 8.4–10.5)
CO2: 24 meq/L (ref 19–32)
CREATININE: 1 mg/dL (ref 0.50–1.35)
Chloride: 96 mEq/L (ref 96–112)
GFR calc Af Amer: 86 mL/min — ABNORMAL LOW (ref >90)
GFR calc non Af Amer: 74 mL/min — ABNORMAL LOW (ref >90)
GLUCOSE: 163 mg/dL — AB (ref 70–99)
Potassium: 3.7 mEq/L (ref 3.7–5.3)
Sodium: 133 mEq/L — ABNORMAL LOW (ref 137–147)

## 2014-05-01 LAB — GLUCOSE, CAPILLARY: Glucose-Capillary: 155 mg/dL — ABNORMAL HIGH (ref 70–99)

## 2014-05-01 MED ORDER — THIAMINE HCL 100 MG PO TABS: 100.0000 mg | ORAL_TABLET | Freq: Every day | ORAL | Status: AC

## 2014-05-01 MED ORDER — OXYCODONE HCL 5 MG PO TABS: 5.0000 mg | ORAL_TABLET | ORAL | Status: DC | PRN

## 2014-05-01 MED ORDER — AMIODARONE HCL 200 MG PO TABS
200.0000 mg | ORAL_TABLET | Freq: Two times a day (BID) | ORAL | Status: DC
  Administered 2014-05-01: 200 mg via ORAL
  Filled 2014-05-01 (×2): qty 1

## 2014-05-01 MED ORDER — MORPHINE SULFATE ER 15 MG PO TBCR: 15.0000 mg | EXTENDED_RELEASE_TABLET | Freq: Two times a day (BID) | ORAL | Status: DC

## 2014-05-01 MED ORDER — FOLIC ACID 1 MG PO TABS: 1.0000 mg | ORAL_TABLET | Freq: Every day | ORAL | Status: AC

## 2014-05-01 MED ORDER — POLYETHYLENE GLYCOL 3350 17 G PO PACK: 17.0000 g | PACK | Freq: Every day | ORAL | Status: AC | PRN

## 2014-05-01 MED ORDER — DSS 100 MG PO CAPS: 100.0000 mg | ORAL_CAPSULE | Freq: Two times a day (BID) | ORAL | Status: AC

## 2014-05-01 MED ORDER — CARVEDILOL 6.25 MG PO TABS: 6.2500 mg | ORAL_TABLET | Freq: Two times a day (BID) | ORAL | Status: AC

## 2014-05-01 MED ORDER — AMIODARONE HCL 200 MG PO TABS: 200.0000 mg | ORAL_TABLET | Freq: Two times a day (BID) | ORAL | Status: AC

## 2014-05-01 NOTE — Telephone Encounter (Signed)
Spoke with daughter in law as wife had gone to the pharmacy.  She is aware of the appt scheduled per Dr Aundra Dubin in the [facility] next week 6/10 at [place].  She was given instructions for the pt to report to the main entrance of [facility], leave his car with the valet and have [facility].  She is aware that if pt has changes in s/s over the weekend to call the MD on call however at this time Dr Aundra Dubin did not want to restart diuretics without knowing pts BP and the extent of the edema.

## 2014-05-01 NOTE — Telephone Encounter (Signed)
Billy Bush needs to see me early next week, have him come into the CHF clinic to see me on a day I am scheduled to be there.

## 2014-05-01 NOTE — Telephone Encounter (Signed)
New message     Pt was discharged from hosp today.  Wife has concerns about all of his fluid pills being stopped.  Please call

## 2014-05-01 NOTE — Telephone Encounter (Signed)
Spoke with wife a length.  Since pt has been home from the hospital she has noticed an increase in his abdominal girth.  She states he has gone up from a 34 pant to a 38 pant since being home.  She has not been having him to weigh daily.  He does not have any ankle edema.  He is not SOB.  They have not taken his BP since he has been home because they are unsure if their machine is accurate.  Due to hypotension and increased renal function all diuretics were d/c and not restarted prior to discharge.  Advised I will forward this information to Dr Aundra Dubin for review and orders but that the pt has to weigh daily and a dairy of the weighs need to be kept.  Advised to change the batteries in their BP cuff or use power cord to check BP - if she still thinks it is incorrect they should consider obtaining another one as it is difficult to adjust medications for fluid retention with his history of hypotension.  She states understanding and will wait for a return call addressing the diuretics once reviewed by MD.

## 2014-05-01 NOTE — Progress Notes (Signed)
Follow-up [facility] continues to follow for hospice needs at discharge.  Writer visited in room with pt who was up in recliner, two sons present; pt stated he feels good this morning and is looking forward to going home today. Confirmed pt/family wishes for [facility] to see pt on Monday 05/04/14 at 2:30-3:00 pm - [facility] will also follow up Monday morning with pt/wife. Family aware they can contact [facility]-contact numbers given on past visits [phone]) with any questions.  Thank you.  Danton Sewer, RN, MSN, [facility] Liaison [phone]

## 2014-05-01 NOTE — Discharge Summary (Signed)
Physician Discharge Summary  Patient ID: Billy Bush MRN: [id number] DOB/AGE: 1941-06-07 73 y.o.  Admit date: 04/24/2014 Discharge date: 05/01/2014  Primary Care Physician:  Billy Shin, MD  Discharge Diagnoses:   . New diagnosis of Atrial fibrillation  . Acute renal failure . Protein-calorie malnutrition, severe   Esophageal cancer (high-grade neuroendocrine carcinoma)/dysphagia/hoarseness (recurrent laryngeal nerve palsy . DIABETES MELLITUS, TYPE I . Other and unspecified hyperlipidemia . Hypotension . Hyponatremia . CORONARY ARTERY DISEASE . Dehydration . THROMBOCYTOPENIA . ANEMIA, IRON DEFICIENCY . HYPERTENSION . SYSTOLIC HEART FAILURE, CHRONIC   Consults: Gastroenterology, Dr. Olevia Perches               Cardiology, Dr. Radford Pax       Recommendations for Outpatient Follow-up:   please note that Lasix, spironolactone, losartan are currently on HOLD due to hypotension, please assess if they can be restarted  Allergies:   Allergies  Allergen Reactions  . Ramipril Other (See Comments)    cough     Discharge Medications:   Medication List    STOP taking these medications       furosemide 80 MG tablet  Commonly known as:  LASIX     HYDROcodone-acetaminophen 10-325 MG per tablet  Commonly known as:  NORCO     losartan 50 MG tablet  Commonly known as:  COZAAR     oxyCODONE-acetaminophen 10-325 MG per tablet  Commonly known as:  PERCOCET     spironolactone 25 MG tablet  Commonly known as:  ALDACTONE      TAKE these medications       acetaminophen 325 MG tablet  Commonly known as:  TYLENOL  Take 325 mg by mouth every 6 (six) hours as needed for moderate pain.     amiodarone 200 MG tablet  Commonly known as:  PACERONE  Take 1 tablet (200 mg total) by mouth 2 (two) times daily.     aspirin EC 81 MG tablet  Take 1 tablet (81 mg total) by mouth daily.     atorvastatin 80 MG tablet  Commonly known as:  LIPITOR  Take 80 mg by mouth at bedtime.      carvedilol 6.25 MG tablet  Commonly known as:  COREG  Take 1 tablet (6.25 mg total) by mouth 2 (two) times daily with a meal.     citalopram 10 MG tablet  Commonly known as:  CELEXA  Take 1 tablet (10 mg total) by mouth daily.     clopidogrel 75 MG tablet  Commonly known as:  PLAVIX  Take 1 tablet (75 mg total) by mouth daily.     co-enzyme Q-10 30 MG capsule  Take 30 mg by mouth daily.     DSS 100 MG Caps  Take 100 mg by mouth 2 (two) times daily. For constipation     folic acid 1 MG tablet  Commonly known as:  FOLVITE  Take 1 tablet (1 mg total) by mouth daily.     glucose blood test strip  Commonly known as:  ONE TOUCH ULTRA TEST  Use as directed twice daily dx 250.01     insulin lispro 100 UNIT/ML injection  Commonly known as:  HUMALOG  Inject into the skin 3x a day (just before each meal) 04-30-13 units     Insulin Pen Needle 32G X 4 MM Misc  Commonly known as:  BD PEN NEEDLE NANO U/F  Use as directed four times daily     morphine 15 MG 12 hr tablet  Commonly  known as:  MS CONTIN  Take 1 tablet (15 mg total) by mouth every 12 (twelve) hours.     multivitamin tablet  Take 1 tablet by mouth daily.     nitroGLYCERIN 0.4 MG SL tablet  Commonly known as:  NITROSTAT  Place 1 tablet (0.4 mg total) under the tongue every 5 (five) minutes as needed for chest pain.     ondansetron 4 MG tablet  Commonly known as:  ZOFRAN  Take 4 mg by mouth 3 (three) times daily before meals. 30 min prior to meals     oxyCODONE 5 MG immediate release tablet  Commonly known as:  Oxy IR/ROXICODONE  Take 1-2 tablets (5-10 mg total) by mouth every 4 (four) hours as needed for moderate pain or breakthrough pain.     polyethylene glycol packet  Commonly known as:  MIRALAX  Take 17 g by mouth daily as needed for moderate constipation.     sodium chloride 0.65 % Soln nasal spray  Commonly known as:  OCEAN  Place 1 spray into both nostrils as needed for congestion.     thiamine 100 MG  tablet  Take 1 tablet (100 mg total) by mouth daily.         Brief H and P: For complete details please refer to admission H and P, but in brief Billy Bush is a 73 y.o. male with a history of esophageal carcinoma who presents to the ED with weakness. He states that he was unable to get and stay up on his feet. Patient has been diagnosed with esophageal carcinoma about a year ago, He had received chemo and radiation earlier this year. As a consequence he developed esophageal strictures. He recently underwent a dilatation about 2 weeks ago. Patient stated that he has had decreased oral intake. He has been taking all his blood pressure medications and also his diuretics however. He denied having any chest pain. He has not had any syncope. He has no edema noted. He has no headaches noted. He admitted to having some right flank pain and also has had some right shoulder blade pain. Patient was committed for further workup.   Hospital Course:   73 year old male with history of CAD, status post CABG, ischemic cardiomyopathy, esophageal cancer (high grade neuroendocrine esophageal cancer) status post chemotherapy and radiation, last echo 2/15 showed EF 25-30%, developed recurrent dysphagia related to radiation-related esophageal stricture, status post dilatation, presented to the ED on 04/24/14 with complaints of generalized weakness, difficulty ambulating, decreased oral intake, continues to take home medications including antihypertensives and diuretics and approximately one month history of diffuse back pain. In the ED, patient was noted to hypotensive, sodium 124, creatinine 2.43, hemoglobin 9.1 and chest x-ray showing new left upper lobe pulmonary nodule. Dehydration and acute renal failure resolved with IV hydration. Patient continued to be intermittently orthostatic. Transfused for anemia. Dysphagia issue ongoing-patient and family deciding regarding further management. Oncology evaluated and patient  now has metastatic disease and recommending hospice care. On 04/29/14, patient also developed a new atrial ablation with RVR, due to hypotension, unable to give beta blocker or calcium channel blocker. No significant effect with digoxin, hence cardiology was consulted and patient was placed on amiodarone drip. Currently weaned off.   Atrial fibrillation with RVR : Converted to normal sinus rhythm on 04/30/2014 on amiodarone drip. Patient was transitioned to oral amiodarone. Heart rate is well controlled in the 70s. Patient is being discharged amiodarone 200 mg twice a day. Per cardiology, anticoagulation  is not an option due to GI bleeding, esophageal cancer. Patient has an appointment with Dr. Aundra Dubin for followup  Acute renal insufficiency - Resolved, likely due to dehydration, hypotension and ACEI/ARB  - No hydronephrosis on renal ultrasound.   Hyponatremia: Improved, likely due to dehydration vs SIADH  improved   Esophageal cancer (high-grade neuroendocrine carcinoma)/dysphagia/hoarseness (recurrent laryngeal nerve palsy):  - He is status post EGD and dilatation of radiation related stricture on 5/14.  - GI consultation was consulted and patient had barium esophagram which showed smooth distal esophageal stricture-likely malignant.  - Further workup by CT chest 6/1 showed progressive metastatic disease involving pleura, liver, chest lymph nodes and bones.  - Patient was seen by Dr. Learta Codding, recommended further workup outpatient, has an appointment on May 31, 2014  -  per Dr. Olevia Perches, as patient is tolerating diet, no plan of repeating the endoscopy at this time, recommended to continue Plavix and followup with Dr. Deatra Ina in office, appointment has been made with Ms Wallace Going  hypotension, history of chronic systolic CHF, CAD/CABG,& DES 2 left main/ischemic cardiomyopathy LVEF 25-30%/[employer] ICD  - Will continue aspirin, Plavix, statin  - Continue to hold all other antihypertensives, including  Lasix, spironolactone,ACEI/ARB   Protein-calorie malnutrition, severe   Back pain: - Much controlled after starting pain medications, likely due to bony metastasis, patient was given prescription for MS Contin and oxycodone IR with no refills. Patient will need refills from primary care physicians or oncology   Diabetes mellitus - Currently stable   Day of Discharge BP 97/61  Pulse 85  Temp(Src) 97.7 F (36.5 C) (Oral)  Resp 18  Ht 5\' 7"  (1.702 m)  Wt 68.493 kg (151 lb)  BMI 23.64 kg/m2  SpO2 90%  Physical Exam: General: Alert and awake oriented x3 not in any acute distress. HEENT: anicteric sclera, pupils reactive to light and accommodation CVS: S1-S2 clear no murmur rubs or gallops Chest: clear to auscultation bilaterally, no wheezing rales or rhonchi Abdomen: soft nontender, nondistended, normal bowel sounds Extremities: no cyanosis, clubbing or edema noted bilaterally    The results of significant diagnostics from this hospitalization (including imaging, microbiology, ancillary and laboratory) are listed below for reference.    LAB RESULTS: Basic Metabolic Panel:  Recent Labs Lab 04/28/14 0405 05/01/14 0340  NA 133* 133*  K 3.5* 3.7  CL 97 96  CO2 24 24  GLUCOSE 170* 163*  BUN 11 13  CREATININE 1.02 1.00  CALCIUM 9.1 9.0   Liver Function Tests:  Recent Labs Lab 04/24/14 2001 04/25/14 0445  AST 60* 48*  ALT 18 14  ALKPHOS 209* 168*  BILITOT 0.9 0.7  PROT 6.8 5.5*  ALBUMIN 3.1* 2.4*   No results found for this basename: LIPASE, AMYLASE,  in the last 168 hours No results found for this basename: AMMONIA,  in the last 168 hours CBC:  Recent Labs Lab 04/29/14 0333 05/01/14 0340  WBC 7.6 6.5  HGB 9.7* 8.9*  HCT 28.6* 26.2*  MCV 90.8 90.7  PLT 123* 96*   Cardiac Enzymes: No results found for this basename: CKTOTAL, CKMB, CKMBINDEX, TROPONINI,  in the last 168 hours BNP: No components found with this basename: POCBNP,  CBG:  Recent  Labs Lab 04/30/14 2153 05/01/14 0733  GLUCAP 176* 155*    Significant Diagnostic Studies:  Dg Chest 2 View  04/25/2014   CLINICAL DATA:  Follow-up pulmonary nodule  EXAM: CHEST  2 VIEW  COMPARISON:  04/24/2014  FINDINGS: Left apical rounded opacity again noted  measuring approximately 3 cm. There is no focal parenchymal opacity, pleural effusion, or pneumothorax. Stable cardiomediastinal silhouette. Prior CABG. Single lead cardiac pacer again noted.  The osseous structures are unremarkable.  IMPRESSION: Left apical rounded opacity again noted measuring approximately 3 cm. This may reflect round pneumonia versus abnormality within the chest wall soft tissues. The appearance is unlikely to reflect malignancy given the significant interval enlargement compared with 02/09/2014. Further evaluation with chest CT is recommended.   Electronically Signed   By: Kathreen Devoid   On: 04/25/2014 16:48   Dg Chest Port 1 View  04/24/2014   CLINICAL DATA:  73 year old male with hypotension. Pain. Initial encounter.  EXAM: PORTABLE CHEST - 1 VIEW  COMPARISON:  03/14/2014 and earlier.  FINDINGS: Portable AP upright view at 2017 hrs. New since last month is indistinct 3 cm nodular opacity in the left lung apex. There seems to be some additional associated extra pulmonary density near the left shoulder (horizontal arrow).  Otherwise improved lung base ventilation. Lung parenchyma elsewhere appears stable with no pneumothorax or effusion. Stable cardiomegaly and mediastinal contours. Stable left chest cardiac AICD. Visualized tracheal air column is within normal limits.  IMPRESSION: 1. A left upper lobe 3 cm pulmonary nodule is new since last month, suggesting either infectious etiology or artifact. PA and lateral chest radiographs would be valuable when possible. Alternatively, chest CT (IV contrast preferred) could be utilized. 2. Improved bibasilar ventilation since April. No other new cardiopulmonary abnormality identified.    Electronically Signed   By: Lars Pinks M.D.   On: 04/24/2014 20:30    2D ECHO:   Disposition and Follow-up:     Discharge Instructions   Discharge instructions    Complete by:  As directed   Diet: SOFT DIET     Increase activity slowly    Complete by:  As directed             DISPOSITION: home  DIET:  dysphagia 3 soft diet     DISCHARGE FOLLOW-UP Follow-up Information   Follow up with Tye Savoy, NP On 05/11/2014. (2:30 pm; this appt is with our nurse practitioner, but Dr. Deatra Ina will be availabe for discussion with her if needed)    Specialty:  Nurse Practitioner   Contact information:   [address] [place] [address] [phone]       Follow up with Loralie Champagne, MD On 05/18/2014. (AT 4:30 PM, for hospital follow-up)    Specialty:  Cardiology   Contact information:   [address] [place] [address] [phone]       Follow up with Betsy Coder, MD On 03-Jun-2014. (at 10:00 AM)    Specialty:  Oncology   Contact information:   [place] [address] [phone]       Time spent on Discharge:  40 minutes   Signed:   Mendel Corning M.D. [facility] 05/01/2014, 12:04 PM Pager: [phone]   **Disclaimer: This note was dictated with voice recognition software. Similar sounding words can inadvertently be transcribed and this note may contain transcription errors which may not have been corrected upon publication of note.**

## 2014-05-01 NOTE — Progress Notes (Signed)
SUBJECTIVE:  No complaints  OBJECTIVE:   Vitals:   Filed Vitals:   04/30/14 1411 04/30/14 1736 04/30/14 2134 05/01/14 0527  BP: 115/58 100/60 98/43 94/38   Pulse: 84 74 73 81  Temp: 97.7 F (36.5 C)  97.5 F (36.4 C) 97.7 F (36.5 C)  TempSrc: Oral  Oral Oral  Resp: 16  18 18   Height:      Weight:      SpO2: 96%  95% 90%   I&O's:   Intake/Output Summary (Last 24 hours) at 05/01/14 0716 Last data filed at 04/30/14 1700  Gross per 24 hour  Intake    360 ml  Output    450 ml  Net    -90 ml   TELEMETRY: Reviewed telemetry pt in NSR:     PHYSICAL EXAM General: Well developed, well nourished, in no acute distress Head: Eyes PERRLA, No xanthomas.   Normal cephalic and atramatic  Lungs:   Clear bilaterally to auscultation and percussion. Heart:   HRRR S1 S2 Pulses are 2+ & equal. Abdomen: Bowel sounds are positive, abdomen soft and non-tender without masses  Extremities:   No clubbing, cyanosis or edema.  DP +1 Neuro: Alert and oriented X 3. Psych:  Good affect, responds appropriately   LABS: Basic Metabolic Panel:  Recent Labs  05/01/14 0340  NA 133*  K 3.7  CL 96  CO2 24  GLUCOSE 163*  BUN 13  CREATININE 1.00  CALCIUM 9.0   Liver Function Tests: No results found for this basename: AST, ALT, ALKPHOS, BILITOT, PROT, ALBUMIN,  in the last 72 hours No results found for this basename: LIPASE, AMYLASE,  in the last 72 hours CBC:  Recent Labs  04/29/14 0333 05/01/14 0340  WBC 7.6 6.5  HGB 9.7* 8.9*  HCT 28.6* 26.2*  MCV 90.8 90.7  PLT 123* 96*   Cardiac Enzymes: No results found for this basename: CKTOTAL, CKMB, CKMBINDEX, TROPONINI,  in the last 72 hours BNP: No components found with this basename: POCBNP,  D-Dimer: No results found for this basename: DDIMER,  in the last 72 hours Hemoglobin A1C: No results found for this basename: HGBA1C,  in the last 72 hours Fasting Lipid Panel: No results found for this basename: CHOL, HDL, LDLCALC, TRIG,  CHOLHDL, LDLDIRECT,  in the last 72 hours Thyroid Function Tests: No results found for this basename: TSH, T4TOTAL, FREET3, T3FREE, THYROIDAB,  in the last 72 hours Anemia Panel: No results found for this basename: VITAMINB12, FOLATE, FERRITIN, TIBC, IRON, RETICCTPCT,  in the last 72 hours Coag Panel:   Lab Results  Component Value Date   INR 1.24 04/25/2014   INR 1.0 RATIO 07/02/2007   INR 1.0 RATIO 05/21/2007    RADIOLOGY: Dg Chest 2 View  04/25/2014   CLINICAL DATA:  Follow-up pulmonary nodule  EXAM: CHEST  2 VIEW  COMPARISON:  04/24/2014  FINDINGS: Left apical rounded opacity again noted measuring approximately 3 cm. There is no focal parenchymal opacity, pleural effusion, or pneumothorax. Stable cardiomediastinal silhouette. Prior CABG. Single lead cardiac pacer again noted.  The osseous structures are unremarkable.  IMPRESSION: Left apical rounded opacity again noted measuring approximately 3 cm. This may reflect round pneumonia versus abnormality within the chest wall soft tissues. The appearance is unlikely to reflect malignancy given the significant interval enlargement compared with 02/09/2014. Further evaluation with chest CT is recommended.   Electronically Signed   By: Kathreen Devoid   On: 04/25/2014 16:48   Ct Chest Wo Contrast  04/27/2014   CLINICAL DATA:  History of esophageal cancer. Back pain. Currently undergoing chemotherapy and radiation therapy.  EXAM: CT CHEST WITHOUT CONTRAST  TECHNIQUE: Multidetector CT imaging of the chest was performed following the standard protocol without IV contrast.  COMPARISON:  CT of the chest, abdomen and pelvis 02/09/2014.  FINDINGS: Mediastinum: There are 2 masslike areas in the mediastinum, 1 in the mediastinum immediately posterior to the carina (image 30 of series 2) measuring 4.8 x 3.3 cm, which either arises from the esophagus or is immediately adjacent to the esophagus (favored, likely an enlarged lymph node) exerting significant mass effect upon  the esophagus. There is also a 3.2 x 2.7 cm mass in the superior mediastinum to the left side of the trachea and esophagus exerting mass effect upon all adjacent structures (image 13 of series 2). In the distal esophagus immediately above the gastroesophageal junction there is also masslike enlargement which distorts the small amount of oral contrast material in the lumen, best appreciated on image 50 of series 2. Heart size is mildly enlarged. There is no significant pericardial fluid, thickening or pericardial calcification. There is atherosclerosis of the thoracic aorta, the great vessels of the mediastinum and the coronary arteries, including calcified atherosclerotic plaque in the left main, left anterior descending, left circumflex and right coronary arteries. Stent in left main coronary artery and likely in the mid to distal right coronary artery. Status post median sternotomy for CABG, including LIMA to LAD. Left-sided pacemaker device in position with lead tip terminating in the right ventricular free wall. Dilatation of the pulmonic trunk (4.5 cm in diameter), suggesting underlying pulmonary arterial hypertension.  Lungs/Pleura: Moderate bilateral pleural effusions layer dependently and are simple in appearance on today's non contrast CT examination. These are associated with extensive passive atelectasis throughout the lower lobes of the lungs bilaterally. There are a few patchy micronodules in the periphery of the lungs bilaterally which are nonspecific and favored to be infectious or inflammatory. However, there is also a large pleural-based nodule in the anterior aspect of the left upper lobe best appreciated on image 14 of series 2 measuring approximately 2.9 x 2.9 cm, with apparent direct chest wall invasion between the left first and second ribs. Multiple areas of new nodular pleural thickening are noted in the lateral aspect of the right hemithorax, best appreciated on image 22 and image 31 of  series 2, suspicious for pleural metastases.  Upper Abdomen: Small volume of perihepatic ascites. Several ill-defined low-attenuation hepatic lesions are noted on today's examination, poorly evaluated on this noncontrast CT study, however, the largest of these is estimated to measure approximately 2.6 x 2.2 cm (image 59 of series 2), and these lesions appear either new or enlarged compared to the prior examination from 02/09/2014, highly concerning for metastasis.  Musculoskeletal: Multiples slightly ill-defined mixed lytic and sclerotic areas are noted throughout the visualized axial and appendicular skeleton, suspicious for multifocal metastasis. Specific examples include the lateral aspect of the right fourth rib, lateral aspect of the right sixth rib, lateral aspect of the left fifth rib, as well as multiple small lesions throughout the thoracic spine.  IMPRESSION: 1. Today's study demonstrates progression of disease in the thorax and upper abdomen, with enlargement of apparent esophageal mass immediately above the gastroesophageal junction, progression of mediastinal adenopathy, new pulmonary and pleural metastases, presumably malignant bilateral pleural effusions than or moderate in size, multiple new and enlarging liver lesions, and multiple skeletal lesions, as detailed above. 2. Additional incidental findings, as above.  Electronically Signed   By: Vinnie Langton M.D.   On: 04/27/2014 17:37   Dg Esophagus  04/27/2014   CLINICAL DATA:  Esophageal cancer.  Radiation therapy .  EXAM: ESOPHOGRAM/BARIUM SWALLOW  TECHNIQUE: Single contrast examination was performed using  thin barium .  FLUOROSCOPY TIME:  0 min 39 seconds.  COMPARISON:  CT 03/16/2014.  FINDINGS: This a slightly limited exam due to patient's condition. The upper and mid thoracic esophagus were widely patent. Narrowing of the distal esophagus is present. This appeared relatively fixed. Differential diagnosis includes of malignancy and and or  scarring. Small sliding hiatal hernia is present.  IMPRESSION: Moderate fixed narrowing of the distal esophagus consistent with recurrent tumor and/or scarring.   Electronically Signed   By: Marcello Moores  Register   On: 04/27/2014 12:07   US Renal  04/26/2014   CLINICAL DATA:  Acute renal failure, elevated creatinine, history of esophageal cancer, diabetes and hypertension  EXAM: RENAL/URINARY TRACT ULTRASOUND COMPLETE  COMPARISON:  CT abdomen pelvis - 02/09/2014  FINDINGS: Right Kidney:  Normal cortical thickness, echogenicity and size, measuring 11.9 cm in length. No focal renal lesions. No echogenic renal stones. No urinary obstruction.  Left Kidney:  Normal cortical thickness, echogenicity and size, measuring 12.1 cm in length. No focal renal lesions. No echogenic renal stones. No urinary obstruction.  Bladder:  Appears normal for degree of bladder distention.  Other: Note is made of multiple mixed echogenic solid lesions within the incidentally imaged right lobe of the liver with dominant mass measuring approximately 4.2 x 3.2 cm (image 13) with additional lesion measuring approximately 2.4 x 2.0 cm (also seen on image 13).  IMPRESSION: 1. No explanation for patient's acute renal failure. Specifically, no evidence of urinary obstruction. 2. Interval development of multiple liver lesions within the incidentally imaged right lobe of the liver. While several smaller hyperenhancing liver lesions were noted on prior abdominal CT performed 02/09/2014, these lesions appear new and larger and given history of esophageal cancer are worrisome for metastatic disease. Further evaluation with abdominal CT scan and/or dedicated abdominal ultrasound as clinically indicated.   Electronically Signed   By: Sandi Mariscal M.D.   On: 04/26/2014 08:47   Dg Chest Port 1 View  04/24/2014   CLINICAL DATA:  73 year old male with hypotension. Pain. Initial encounter.  EXAM: PORTABLE CHEST - 1 VIEW  COMPARISON:  03/14/2014 and earlier.   FINDINGS: Portable AP upright view at 2017 hrs. New since last month is indistinct 3 cm nodular opacity in the left lung apex. There seems to be some additional associated extra pulmonary density near the left shoulder (horizontal arrow).  Otherwise improved lung base ventilation. Lung parenchyma elsewhere appears stable with no pneumothorax or effusion. Stable cardiomegaly and mediastinal contours. Stable left chest cardiac AICD. Visualized tracheal air column is within normal limits.  IMPRESSION: 1. A left upper lobe 3 cm pulmonary nodule is new since last month, suggesting either infectious etiology or artifact. PA and lateral chest radiographs would be valuable when possible. Alternatively, chest CT (IV contrast preferred) could be utilized. 2. Improved bibasilar ventilation since April. No other new cardiopulmonary abnormality identified.   Electronically Signed   By: Lars Pinks M.D.   On: 04/24/2014 20:30    Assessment/Plan  Principal Problem:  Atrial fibrillation with RVR now in NSR on IV Amio. Not a candidate for anticoagulation due to high risk for bleed. Transfused this admit. Friable esophagus.  Would continue ASA and Plavix for now.  Active Problems:  DIABETES MELLITUS,  TYPE I  Other and unspecified hyperlipidemia  ANEMIA, IRON DEFICIENCY  THROMBOCYTOPENIA  HYPERTENSION  CORONARY ARTERY DISEASE  SYSTOLIC HEART FAILURE, CHRONIC- monitor IV fluid with EF 20-25%  Hypotension  Hyponatremia  Dehydration  Acute renal failure  Protein-calorie malnutrition, severe   PLAN:  Change to PO Amio today 200mg  BID. Anticoagulation is not an option due to GI bleeding requiring transfusion. I even wonder whether we should consider stopping clopidogrel (his stent is 71 years old). If IM ok we will leave him on it for his PAF since he cannot be on full dose anticoagulation Followup with Dr. Aundra Dubin as outpt in 2 weeks  Sueanne Margarita, MD  05/01/2014  7:16 AM

## 2014-05-06 ENCOUNTER — Ambulatory Visit (HOSPITAL_COMMUNITY)
Admission: RE | Admit: 2014-05-06 | Discharge: 2014-05-06 | Disposition: A | Discharge status: Home / Self Care | Payer: Medicare Other | Source: Ambulatory Visit | Attending: Internal Medicine | Admitting: Internal Medicine

## 2014-05-06 VITALS — BP 92/54 | HR 75 | Wt 163.2 lb

## 2014-05-06 DIAGNOSIS — D3A00 Benign carcinoid tumor of unspecified site: Secondary | ICD-10-CM | POA: Diagnosis not present

## 2014-05-06 DIAGNOSIS — I4891 Unspecified atrial fibrillation: Secondary | ICD-10-CM

## 2014-05-06 DIAGNOSIS — I428 Other cardiomyopathies: Secondary | ICD-10-CM | POA: Diagnosis not present

## 2014-05-06 DIAGNOSIS — I059 Rheumatic mitral valve disease, unspecified: Secondary | ICD-10-CM | POA: Insufficient documentation

## 2014-05-06 DIAGNOSIS — Z95810 Presence of automatic (implantable) cardiac defibrillator: Secondary | ICD-10-CM | POA: Diagnosis not present

## 2014-05-06 DIAGNOSIS — Z87891 Personal history of nicotine dependence: Secondary | ICD-10-CM | POA: Diagnosis not present

## 2014-05-06 DIAGNOSIS — I5022 Chronic systolic (congestive) heart failure: Secondary | ICD-10-CM | POA: Diagnosis present

## 2014-05-06 DIAGNOSIS — Z7901 Long term (current) use of anticoagulants: Secondary | ICD-10-CM | POA: Insufficient documentation

## 2014-05-06 DIAGNOSIS — Z923 Personal history of irradiation: Secondary | ICD-10-CM | POA: Diagnosis not present

## 2014-05-06 DIAGNOSIS — I2510 Atherosclerotic heart disease of native coronary artery without angina pectoris: Secondary | ICD-10-CM | POA: Diagnosis not present

## 2014-05-06 DIAGNOSIS — R609 Edema, unspecified: Secondary | ICD-10-CM | POA: Diagnosis not present

## 2014-05-06 DIAGNOSIS — C7B8 Other secondary neuroendocrine tumors: Secondary | ICD-10-CM | POA: Diagnosis not present

## 2014-05-06 DIAGNOSIS — R1319 Other dysphagia: Secondary | ICD-10-CM | POA: Insufficient documentation

## 2014-05-06 DIAGNOSIS — Z794 Long term (current) use of insulin: Secondary | ICD-10-CM | POA: Insufficient documentation

## 2014-05-06 DIAGNOSIS — Z9861 Coronary angioplasty status: Secondary | ICD-10-CM | POA: Diagnosis not present

## 2014-05-06 DIAGNOSIS — Z951 Presence of aortocoronary bypass graft: Secondary | ICD-10-CM | POA: Diagnosis not present

## 2014-05-06 DIAGNOSIS — D649 Anemia, unspecified: Secondary | ICD-10-CM | POA: Diagnosis not present

## 2014-05-06 DIAGNOSIS — E785 Hyperlipidemia, unspecified: Secondary | ICD-10-CM | POA: Diagnosis not present

## 2014-05-06 DIAGNOSIS — D6959 Other secondary thrombocytopenia: Secondary | ICD-10-CM | POA: Diagnosis not present

## 2014-05-06 DIAGNOSIS — Z9221 Personal history of antineoplastic chemotherapy: Secondary | ICD-10-CM | POA: Insufficient documentation

## 2014-05-06 DIAGNOSIS — K222 Esophageal obstruction: Secondary | ICD-10-CM | POA: Diagnosis not present

## 2014-05-06 DIAGNOSIS — I079 Rheumatic tricuspid valve disease, unspecified: Secondary | ICD-10-CM | POA: Diagnosis not present

## 2014-05-06 DIAGNOSIS — E119 Type 2 diabetes mellitus without complications: Secondary | ICD-10-CM | POA: Insufficient documentation

## 2014-05-06 DIAGNOSIS — Z7982 Long term (current) use of aspirin: Secondary | ICD-10-CM | POA: Insufficient documentation

## 2014-05-06 DIAGNOSIS — T451X5A Adverse effect of antineoplastic and immunosuppressive drugs, initial encounter: Secondary | ICD-10-CM | POA: Insufficient documentation

## 2014-05-06 LAB — URINALYSIS W MICROSCOPIC (NOT AT ARMC)
GLUCOSE, UA: NEGATIVE mg/dL
HGB URINE DIPSTICK: NEGATIVE
KETONES UR: 15 mg/dL — AB
Leukocytes, UA: NEGATIVE
Nitrite: NEGATIVE
PH: 5.5 (ref 5.0–8.0)
Protein, ur: 30 mg/dL — AB
SPECIFIC GRAVITY, URINE: 1.02 (ref 1.005–1.030)
Urobilinogen, UA: 1 mg/dL (ref 0.0–1.0)

## 2014-05-06 MED ORDER — POTASSIUM CHLORIDE CRYS ER 20 MEQ PO TBCR: 20.0000 meq | EXTENDED_RELEASE_TABLET | Freq: Every day | ORAL | Status: AC

## 2014-05-06 MED ORDER — FUROSEMIDE 40 MG PO TABS: 40.0000 mg | ORAL_TABLET | Freq: Every day | ORAL | Status: AC

## 2014-05-06 NOTE — Progress Notes (Signed)
Patient ID: Billy Bush, male   DOB: March 16, 1941, 73 y.o.   MRN: [id number] PCP: Dr. Loanne Drilling Oncologist: Dr. Benay Spice  73 yo with history of CAD s/p CABG, ischemic cardiomyopathy, and esophageal cancer presents for cardiology followup.  He was diagnosed in 2014 with a high grade neuroendocrine esophageal cancer (had dysphagia) and has undergone chemotherapy and radiation. Last echo was in 2/15 and showed EF 25-30% with moderate MR and TR.  His last cardiac cath was in 2008.  At that time, his bypass grafts were patent but he had Cypher DES to left main.  Patient developed recurrent dysphagia from  radiation therapy-related esophageal stricture. He has had esophageal dilation.    Patient was admitted in 5/15 with dehydration.  He had been eating and drinking poorly. He had AKI and was hypotensive.  In the hospital, he went into atrial fibrillation with RVR but converted back to NSR with amiodarone. CT chest showed metastatic disease.  He is now under hospice care and there are no plans for further cancer treatment.  He was not anticoagulated due to anemia (received transfusions in the hospital).  He is currently in NSR.  He was taken off Lasix, losartan, and spironolactone in the hospital.  Since discharge, weight is up about 12 lbs.  He is eating better.  His wife has given him 20 mg Lasix on a couple of occasions. He is very weak.  He has developed lower extremity edema.  He is not very active but does some walking in the house.  He has not been getting short of breath walking short distances.  No orthopnea.  No chest pain. Main problem has been bone pain from mets.  BP 92/54 today.   Labs (2/14): LDL 81, HDL 43 Labs (9/14): K 3.7, creatinine 0.9 Labs (10/14): hemoglobin 8.3, plts 173 Labs (1/15): HCT 21.2, platelets 47, K 4.0, creatinine 0.79, LDL 62, HDL 50 Labs (2/15): BNP 390, K 4.1, creatinine 0.9, hemoglobin 6.8 => 9.1, plts 75 Labs (3/15): K 4.5, creatinine 1.0 Labs (4/15): LDL 63, HDL 50,  HCT 27.2, plts 114 Labs (6/15): K 3.7, creatinine 2.43 => 1.0, HCT 26.2, Plts 96  PMH: 1. Type II diabetes 2. CAD: s/p CABG 1997.  Last LHC in 2008 showed 80% distal LM, patent LIMA-LAD, patent SVG-OM, patent SVG-PDA.  Patient had Cypher DES to left main.  3. Ischemic cardiomyopathy: Patient has a [employer], political party ICD.  Echo in 11/12 showed EF 30-35% with severe LV dilation, EF 30-35%, apical and basal inferior akinesis, moderate MR, mild AI. Echo (2/15) with severe LV dilation, septal and inferior hypokinesis, EF 25-30%, moderate TR, moderate MR, PA systolic pressure 68 mmHg.  4. 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  Metastatic disease noted on CT in 5/15, no plan for further treatment.  5. Hyperlipidemia 6. XRT-related esophageal stricture s/p dilatation 7. Chemotherapy-related thrombocytopenia 8. Atrial fibrillation: Paroxysmal.  Diagnosed in 5/15.   SH: Married, retired [employer], prior smoker, lives in [place].  FH: No premature CAD.   ROS: All systems reviewed and negative except as per HPI.   Current Outpatient Prescriptions  Medication Sig Dispense Refill  . acetaminophen (TYLENOL) 325 MG tablet Take 325 mg by mouth every 6 (six) hours as needed for moderate pain.       Marland Kitchen amiodarone (PACERONE) 200 MG tablet Take 1 tablet (200 mg total) by mouth 2 (two) times daily.  60 tablet  2  . aspirin EC 81 MG tablet Take 1 tablet (81 mg total) by mouth daily.      Marland Kitchen atorvastatin (LIPITOR) 80 MG tablet Take 80 mg by mouth at bedtime.      . carvedilol (COREG) 6.25 MG tablet Take 1 tablet (6.25 mg total) by mouth 2 (two) times daily with a meal.  60 tablet  2  . citalopram (CELEXA) 10 MG tablet Take 1 tablet (10 mg total) by mouth daily.  90 tablet  prn  . clopidogrel (PLAVIX) 75 MG tablet Take 1 tablet (75 mg total) by mouth daily.  90 tablet  1  . co-enzyme Q-10 30 MG capsule  Take 30 mg by mouth daily.        Marland Kitchen docusate sodium 100 MG CAPS Take 100 mg by mouth 2 (two) times daily. For constipation  60 capsule  3  . folic acid (FOLVITE) 1 MG tablet Take 1 tablet (1 mg total) by mouth daily.  30 tablet  3  . glucose blood (ONE TOUCH ULTRA TEST) test strip Use as directed twice daily dx 250.01  200 each  0  . insulin lispro (HUMALOG) 100 UNIT/ML injection Inject into the skin 3x a day (just before each meal) 04-30-13 units      . Insulin Pen Needle (BD PEN NEEDLE NANO U/F) 32G X 4 MM MISC Use as directed four times daily  400 each  3  . morphine (MS CONTIN) 15 MG 12 hr tablet Take 30 mg by mouth every 12 (twelve) hours.      . Multiple Vitamin (MULTIVITAMIN) tablet Take 1 tablet by mouth daily.        . nitroGLYCERIN (NITROSTAT) 0.4 MG SL tablet Place 1 tablet (0.4 mg total) under the tongue every 5 (five) minutes as needed for chest pain.  25 tablet  3  . ondansetron (ZOFRAN) 4 MG tablet Take 4 mg by mouth 3 (three) times daily before meals. 30 min prior to meals      . polyethylene glycol (MIRALAX) packet Take 17 g by mouth daily as needed for moderate constipation.  30 each  3  . sodium chloride (OCEAN) 0.65 % SOLN nasal spray Place 1 spray into both nostrils as needed for congestion.      . thiamine 100 MG tablet Take 1 tablet (100 mg total) by mouth daily.  30 tablet  3  . furosemide (LASIX) 40 MG tablet Take 1 tablet (40 mg total) by mouth daily.  30 tablet    . potassium chloride SA (K-DUR,KLOR-CON) 20 MEQ tablet Take 1 tablet (20 mEq total) by mouth daily.  30 tablet  3   No current facility-administered medications for this encounter.    BP 92/54  Pulse 75  Wt 163 lb 4 oz (74.05 kg)  SpO2 94% General: NAD Neck: JVP 8 cm, no thyromegaly or thyroid nodule.  Lungs: Clear to auscultation bilaterally with normal respiratory effort. CV: Nondisplaced PMI.  Heart regular S1/S2, no S3/S4, 2/6 HSM at apex.  1+ edema to knees.  No carotid bruit.  Normal pedal pulses.   Abdomen: Soft, nontender, no hepatosplenomegaly, no distention.  Neurologic: Alert and oriented x 3.  Psych: Normal affect. Extremities: No clubbing or cyanosis.   Assessment/Plan: 1. CAD: s/p CABG and later Cypher DES to left main in 2008.  He has been on ASA and Plavix chronically.  I have planned to continue Plavix long-term given 1st generation DES and no bleeding problems. He can continue ASA  and Plavix, no overt GI bleeding.  He is on atorvastatin. 2. Chronic systolic CHF: Patient is volume overloaded off Lasix.  Probably NYHA class III symptoms.  He stopped losartan, Lasix, and spironolactone in the setting of dehydration (poor po intake with AKI and hypotension).  BP remains soft but creatinine back to 1.0.  EF 25-30% on last echo (basically stable).  He has a [employer] officer, political party ICD.   - He can continue Coreg at current dose.  - I would keep him off losartan and spironolactone with soft blood pressure.  - Restart Lasix 40 mg daily with KCl 20 daily.  He will need followup in 2 wks.  BMET next week.  3. Murmur: Moderate MR and TR by echo. 4. Atrial fibrillation: Paroxysmal.  He is in NSR on amiodarone.  - Continue amiodarone, will check LFTs and TSH. He can decrease dose to 200 mg daily at next visit.  - Would hold off an anticoagulation with anemia and metastatic cancer.  He can continue ASA and Plavix.  5. He is now under hospice care.    Loralie Champagne 05/06/2014

## 2014-05-06 NOTE — Patient Instructions (Signed)
Start Furosemide 40 mg daily  Start Potassium (k-dur) 20 meq daily  Labs in 1 week, will send order to Hospice to draw   Your physician recommends that you schedule a follow-up appointment in: 2 weeks

## 2014-05-07 ENCOUNTER — Telehealth: Payer: Self-pay | Admitting: Cardiology

## 2014-05-07 NOTE — Telephone Encounter (Signed)
Patients daughter inlaw called in with concerns. Dr. Aundra Dubin gave several scripts to the patient at his appointment yesterday. Patient is in his final stages of cancer and has been given a month to live. Family doesn't think Dr. Aundra Dubin is aware of this, hospice has already been called in .

## 2014-05-08 NOTE — Telephone Encounter (Signed)
New message   Wife calling .   1.Can prescription be sent in a different form due to large size of pill . Potassium chloride  20 meq.   2. Urine test results.

## 2014-05-11 ENCOUNTER — Telehealth: Payer: Self-pay | Admitting: *Deleted

## 2014-05-11 ENCOUNTER — Ambulatory Visit: Payer: Medicare HMO | Admitting: Nurse Practitioner

## 2014-05-11 NOTE — Telephone Encounter (Signed)
Spoke with wife, who reports Billy Bush is very weak and can no longer ambulate. Takes children to help get him in transfer chair to get to living room or back to bed. The MS Contin is working well for his pain and he is drinking homemade smoothies well at current time. Hospice nurse, Constance Holster follows him and is due in tomorrow. She is asking if it is really necessary for Billy Bush to come in Thursday? What will MD do? Made her aware that MD would do physical exam and discuss home care and if he is comfortable. He is agreeable to comfort/supportive care only. Wanted Billy Bush to have follow up so he would know Dr. Benay Spice has not deserted him and wants to help him. She will discuss with her sons to determine if they want to bring him in or not and will call. She is comfortable with Hospice care and knows that Hospice has a MD on staff that could check on him if needed.

## 2014-05-11 NOTE — Telephone Encounter (Signed)
Left VM requesting return call to discuss his appointment with Dr. Benay Spice on 6/18.

## 2014-05-11 NOTE — Telephone Encounter (Signed)
Spoke w/pt's wife, she states pt is unable to take KCL tab and has not taken it at all, he has been on Lasix since last week and Hospice is suppose to draw bmet tomorrow, will await lab results to call her back regarding KCL, he has not c/o cramps

## 2014-05-11 NOTE — Telephone Encounter (Signed)
Follow up     Can pt be prescribed a different potassium pill.  He has throat cancer.  The potassium pill is too big. And want urine test results

## 2014-05-11 NOTE — Telephone Encounter (Signed)
New Prob   Wanted to make Dr. Aundra Dubin aware that if labs were recently ordered, family may not want to move forward due to pts decline. Also, may be calling back sometime this week to demagnetize device.

## 2014-05-13 ENCOUNTER — Telehealth (HOSPITAL_COMMUNITY): Payer: Self-pay | Admitting: Cardiology

## 2014-05-13 ENCOUNTER — Telehealth: Payer: Self-pay | Admitting: *Deleted

## 2014-05-13 NOTE — Telephone Encounter (Signed)
Dr. Cordelia Pen patient, he was just discharged from the hospital with Hospice care, wife says he's not eating anything, but may sip on a smoothie once in a while.  Wife wants to know how she should do his insulin?  Please advise

## 2014-05-13 NOTE — Telephone Encounter (Signed)
Hospice nurse called with a request to turn off pts ICD as pt is actively dying [place] pt  Per vo Dr.Bensimhon, ok to proceed with turing ok pts ICD  Spoke with [place] [phone] and she will contact hospice nurse directly to complete

## 2014-05-13 NOTE — Telephone Encounter (Signed)
Noted, patients wife is aware. 

## 2014-05-13 NOTE — Telephone Encounter (Signed)
Take 2-4 units only if the blood sugar is over 200 or if eating a full meal

## 2014-05-14 ENCOUNTER — Ambulatory Visit: Payer: Medicare HMO | Admitting: Oncology

## 2014-05-14 ENCOUNTER — Telehealth: Payer: Self-pay | Admitting: *Deleted

## 2014-05-18 ENCOUNTER — Encounter: Payer: Medicare HMO | Admitting: Cardiology

## 2014-05-19 NOTE — Telephone Encounter (Signed)
As of 06/14/14- pt is deceased

## 2014-05-20 ENCOUNTER — Encounter (HOSPITAL_COMMUNITY): Payer: Medicare HMO

## 2014-05-27 NOTE — Telephone Encounter (Signed)
Patient died in home today, May 25, 2014 at 0239.

## 2014-05-27 DEATH — deceased

## 2014-06-02 ENCOUNTER — Ambulatory Visit: Payer: Medicare HMO

## 2014-06-14 IMAGING — CT CT ABDOMEN W/ CM
2 of 4 series · 15 of 46 positions shown, 17 images · IV contrast (omnipaque)
Comparison: CT CHEST W/O CM dated 09/25/2013;

CLINICAL DATA: Restaging of esophageal small cell carcinoma.
Diagnosed [DATE]. Chemotherapy and radiation therapy complete.

EXAM:
CT CHEST AND ABDOMEN WITH CONTRAST
TECHNIQUE: Multidetector CT imaging of the chest and abdomen was performed
following the standard protocol during bolus administration of
intravenous contrast.
CONTRAST:  100mL OMNIPAQUE IOHEXOL 300 MG/ML  SOLN

[Series 2: ca with st · axial · 0.73mm/px · z∈[+821,+1231]mm · 12 of 90 slices shown, 14 images]
[im 4/90  soft-tissue]
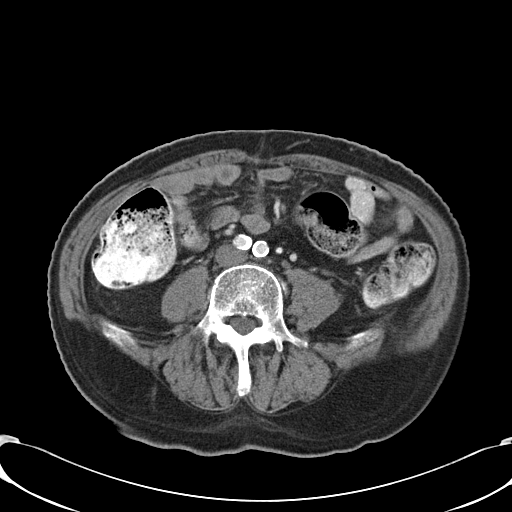
[im 4/90  bone]
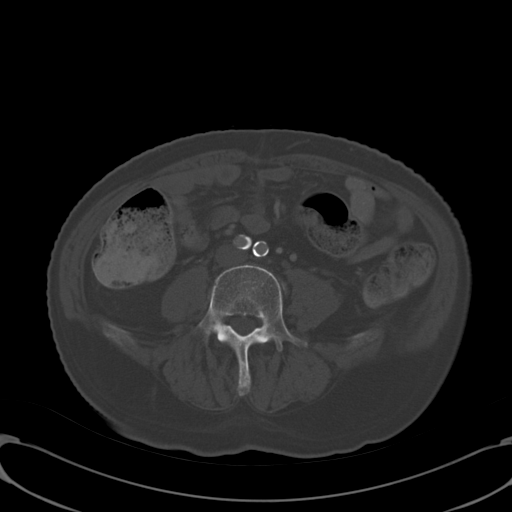
[im 12/90  soft-tissue]
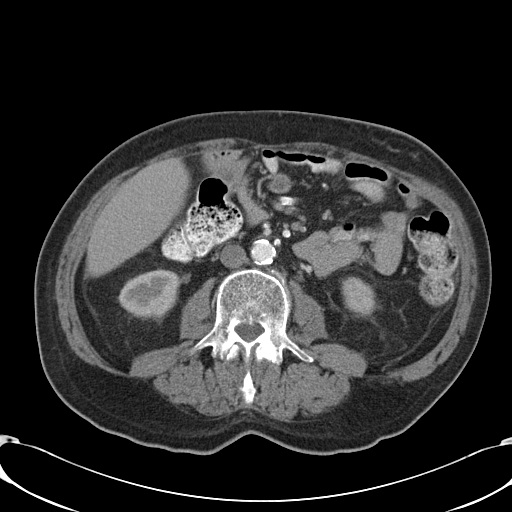
[im 20/90  soft-tissue]
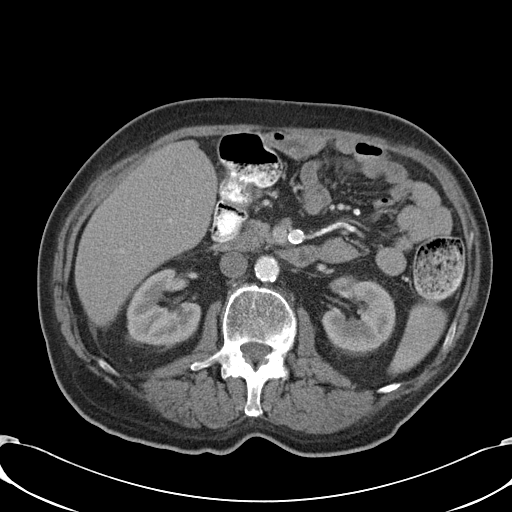
[im 28/90  soft-tissue]
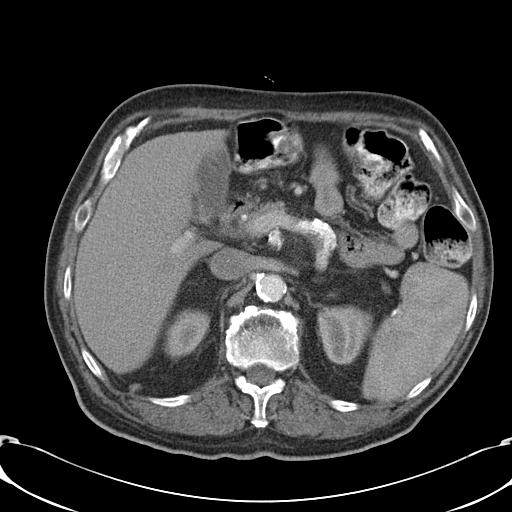
[im 35/90  soft-tissue]
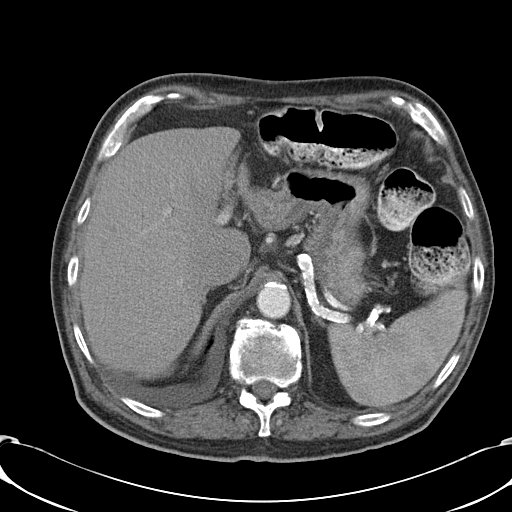
[im 43/90  soft-tissue]
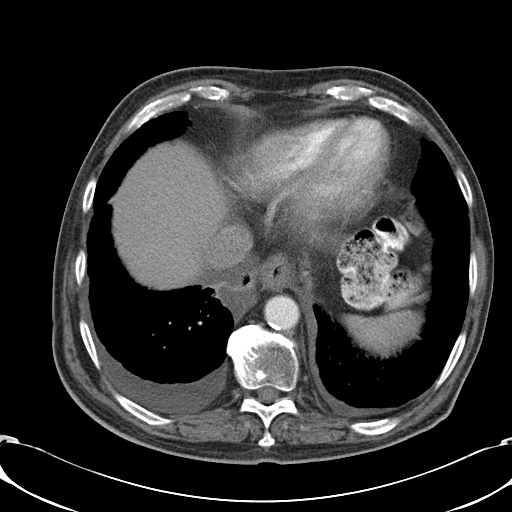
[im 47/90  soft-tissue]
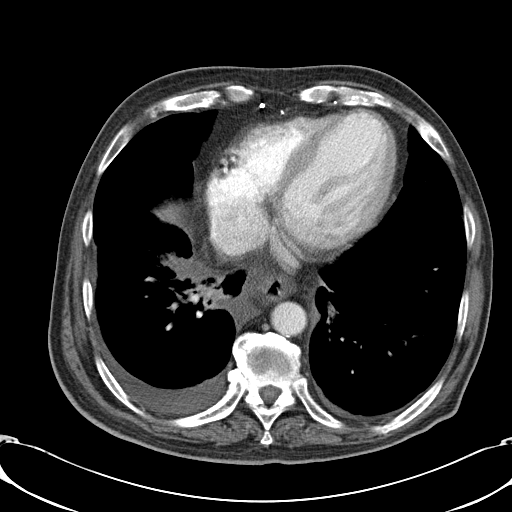
[im 55/90  soft-tissue]
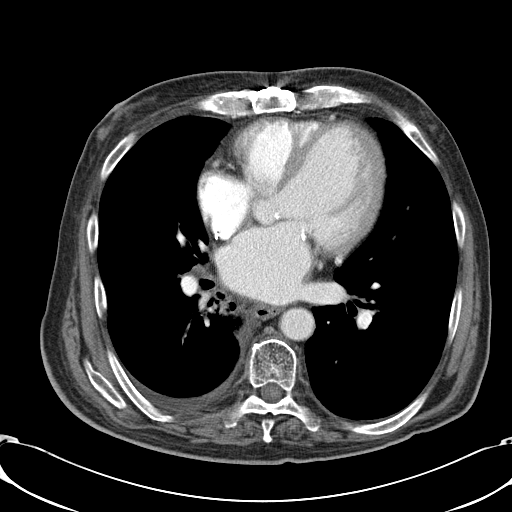
[im 62/90  soft-tissue]
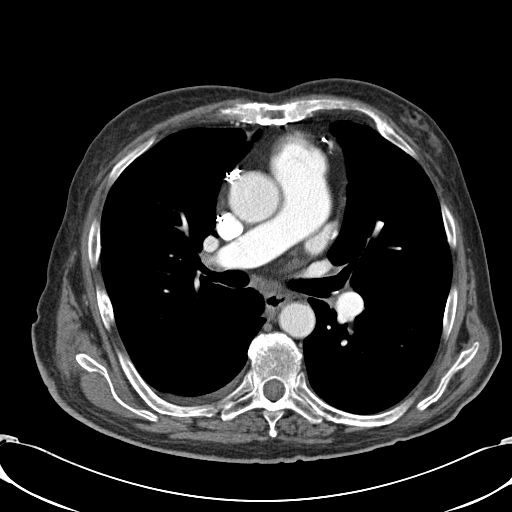
[im 62/90  bone]
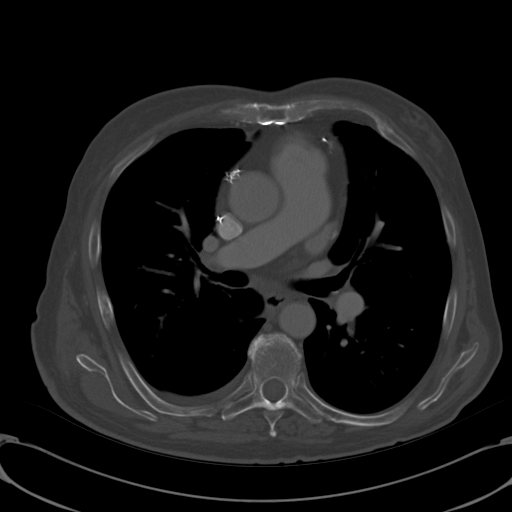
[im 70/90  soft-tissue]
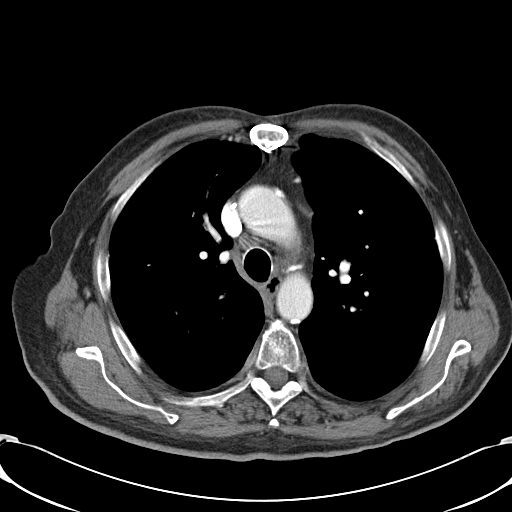
[im 78/90  soft-tissue]
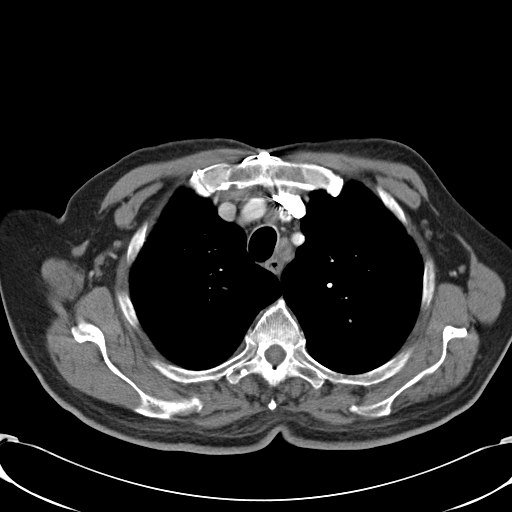
[im 86/90  soft-tissue]
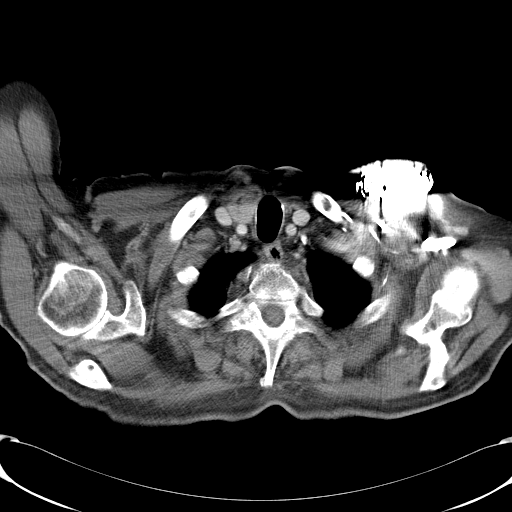

[Series 602: <mpr thick range> · coronal · 0.88mm/px · 3 of 97 slices shown]
[im 33/97  soft-tissue]
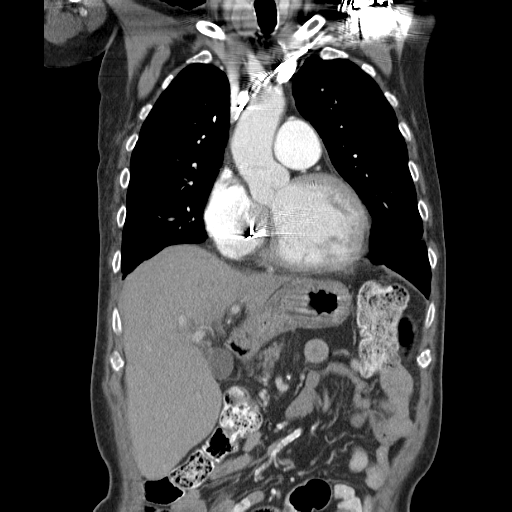
[im 43/97  soft-tissue]
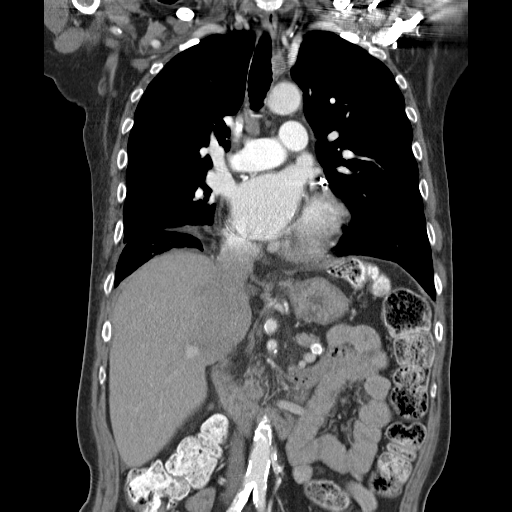
[im 54/97  soft-tissue]
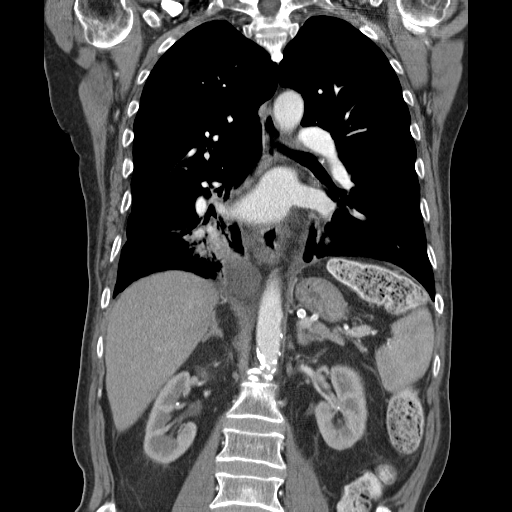

[15 of 46 positions shown; findings below may reference images not displayed]

CT ABD/PELV WO CM
dated 09/25/2013; NM PET IMAGE INITIAL (PI) SKULL BASE TO THIGH
dated 07/23/2013; CT CHEST W/CM dated 07/11/2013
FINDINGS: CT CHEST FINDINGS

Lungs/Pleura: Probable secretions along the right-sided the trachea.

Clustered anteromedial right upper lobe nodules, including on images
26-28. Favored to be similar to the prior exam and likely post
infectious or inflammatory. The right middle lobe micro nodularity
described on the prior is similar.

New peribronchovascular right lower lobe airspace disease. Mild
interstitial thickening in the medial left lower lobe.

Increase in small right and trace left pleural effusions.

Heart/Mediastinum: No supraclavicular adenopathy. Bilateral
gynecomastia. Prior median sternotomy. Aortic and branch vessel
atherosclerosis. Mild cardiomegaly, without pericardial effusion.
Pacer. Pulmonary artery enlargement, with the outflow tract
measuring 3.9 cm.

A left paratracheal node measures 1.2 cm on image 12 and is enlarged
from 7 mm on the prior. Suspicion of central necrosis.

1.0 cm precarinal node on image 23 is similar to minimally enlarged
from 9 mm on the prior exam. Right hilar node measures upper normal
at 1.3 cm, indeterminate.

Distal esophageal wall thickening is minimally improved. Maximally
1.3 cm on image 45 today versus 1.6 cm on the prior. No obstruction.

CT ABDOMEN  FINDINGS

Abdomen: Hepatomegaly, greater than 20 cm. 6 mm hyperenhancing focus
in the inferior right lobe of the liver is at the site of a too
small to characterize lesion on the unenhanced prior study.
Indeterminate.

More superior right liver lobe subcapsular subcentimeter focus of
hyper enhancement image 77 is similar to the prior exam (hypo
attenuating on that non enhanced study). Other small foci of
relatively early hepatic hyper enhancement including on images 49
and 58 are indeterminate. These hyperenhancing lesions were present
on 07/11/2013.

Subtle 8 mm hypo attenuating right liver lobe lesion on image 72.
Grossly similar on the prior exam.

Normal spleen, stomach, pancreas. Cholelithiasis without acute
cholecystitis or biliary ductal dilatation.

Normal adrenal glands. Right renal vascular calcifications. Normal
left kidney. Advanced aortic and branch vessel atherosclerosis.
Resolution of adenopathy in the gastrohepatic ligament. No
retroperitoneal adenopathy.

Normal abdominal bowel loops, without ascites. No evidence of
omental or peritoneal disease.

Bones/Musculoskeletal: No acute osseous abnormality.
IMPRESSION: CT CHEST IMPRESSION

1. New upper thoracic adenopathy, consistent with progressive
disease.
2. Response to therapy of esophageal primary with further decrease
in wall thickening.
3. Presumed radiation changes within the medial lung bases, greater
right than left. Other areas of micro nodularity within the lungs
are similar and likely post infectious or inflammatory.
4. Slight increase can right greater than left small pleural
effusions.
5. Pulmonary artery enlargement suggests pulmonary arterial
hypertension.
6. Bilateral gynecomastia.

CT ABDOMEN AND PELVIS IMPRESSION

1. Response to therapy of upper abdominal adenopathy.
2. Liver lesions. Felt to be similar back to 07/11/2013. Favor
benign entities, including hyperenhancing small hemangiomas.
Recommend attention on follow-up.
3. Cholelithiasis.

## 2014-07-09 ENCOUNTER — Encounter: Payer: Medicare HMO | Admitting: Internal Medicine

## 2014-07-15 ENCOUNTER — Ambulatory Visit: Payer: Medicare HMO | Admitting: Cardiology

## 2014-07-23 ENCOUNTER — Other Ambulatory Visit: Payer: Self-pay | Admitting: *Deleted
